# Patient Record
Sex: Female | Born: 1945
Health system: Southern US, Community
[De-identification: ages and names within clinical notes are randomized; demographics above are authoritative.]

## PROBLEM LIST (undated history)

## (undated) DIAGNOSIS — I10 Essential (primary) hypertension: Secondary | ICD-10-CM

## (undated) DIAGNOSIS — H35039 Hypertensive retinopathy, unspecified eye: Secondary | ICD-10-CM

## (undated) DIAGNOSIS — E119 Type 2 diabetes mellitus without complications: Secondary | ICD-10-CM

## (undated) DIAGNOSIS — E11319 Type 2 diabetes mellitus with unspecified diabetic retinopathy without macular edema: Secondary | ICD-10-CM

## (undated) DIAGNOSIS — I639 Cerebral infarction, unspecified: Secondary | ICD-10-CM

## (undated) HISTORY — DX: Essential (primary) hypertension: I10

## (undated) HISTORY — PX: GALLBLADDER SURGERY: SHX652

## (undated) HISTORY — DX: Cerebral infarction, unspecified: I63.9

## (undated) HISTORY — DX: Hypertensive retinopathy, unspecified eye: H35.039

## (undated) HISTORY — DX: Type 2 diabetes mellitus with unspecified diabetic retinopathy without macular edema: E11.319

## (undated) HISTORY — PX: COLONOSCOPY: SHX174

## (undated) HISTORY — DX: Type 2 diabetes mellitus without complications: E11.9

## (undated) HISTORY — PX: CATARACT EXTRACTION: SUR2

## (undated) HISTORY — PX: ESOPHAGOGASTRODUODENOSCOPY: SHX1529

## (undated) HISTORY — PX: EYE SURGERY: SHX253

---
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
## 2017-05-30 DIAGNOSIS — I639 Cerebral infarction, unspecified: Secondary | ICD-10-CM

## 2017-05-30 HISTORY — DX: Cerebral infarction, unspecified: I63.9

## 2018-01-30 ENCOUNTER — Ambulatory Visit (INDEPENDENT_AMBULATORY_CARE_PROVIDER_SITE_OTHER): Payer: Medicare HMO | Admitting: Family Medicine

## 2018-01-30 ENCOUNTER — Encounter: Payer: Self-pay | Admitting: Family Medicine

## 2018-01-30 ENCOUNTER — Ambulatory Visit: Payer: Self-pay | Admitting: Family Medicine

## 2018-01-30 VITALS — BP 128/78 | HR 91 | Ht 66.5 in | Wt 199.1 lb

## 2018-01-30 DIAGNOSIS — Z1211 Encounter for screening for malignant neoplasm of colon: Secondary | ICD-10-CM

## 2018-01-30 DIAGNOSIS — I10 Essential (primary) hypertension: Secondary | ICD-10-CM | POA: Insufficient documentation

## 2018-01-30 DIAGNOSIS — E7800 Pure hypercholesterolemia, unspecified: Secondary | ICD-10-CM

## 2018-01-30 DIAGNOSIS — Z794 Long term (current) use of insulin: Secondary | ICD-10-CM

## 2018-01-30 DIAGNOSIS — E1140 Type 2 diabetes mellitus with diabetic neuropathy, unspecified: Secondary | ICD-10-CM | POA: Diagnosis not present

## 2018-01-30 LAB — URINALYSIS, ROUTINE W REFLEX MICROSCOPIC
Bilirubin Urine: NEGATIVE
Ketones, ur: NEGATIVE
Nitrite: NEGATIVE
Specific Gravity, Urine: 1.02 (ref 1.000–1.030)
Total Protein, Urine: NEGATIVE
Urine Glucose: NEGATIVE
Urobilinogen, UA: 0.2 (ref 0.0–1.0)
pH: 5.5 (ref 5.0–8.0)

## 2018-01-30 LAB — LIPID PANEL
Cholesterol: 142 mg/dL (ref 0–200)
HDL: 31.3 mg/dL — ABNORMAL LOW (ref >39.00)
NonHDL: 110.59
Total CHOL/HDL Ratio: 5
Triglycerides: 208 mg/dL — ABNORMAL HIGH (ref 0.0–149.0)
VLDL: 41.6 mg/dL — ABNORMAL HIGH (ref 0.0–40.0)

## 2018-01-30 LAB — CBC
HCT: 31.1 % — ABNORMAL LOW (ref 36.0–46.0)
Hemoglobin: 10.7 g/dL — ABNORMAL LOW (ref 12.0–15.0)
MCHC: 34.4 g/dL (ref 30.0–36.0)
MCV: 83 fl (ref 78.0–100.0)
Platelets: 442 10*3/uL — ABNORMAL HIGH (ref 150.0–400.0)
RBC: 3.75 Mil/uL — ABNORMAL LOW (ref 3.87–5.11)
RDW: 12.2 % (ref 11.5–15.5)
WBC: 6.9 10*3/uL (ref 4.0–10.5)

## 2018-01-30 LAB — COMPREHENSIVE METABOLIC PANEL
ALT: 9 U/L (ref 0–35)
AST: 12 U/L (ref 0–37)
Albumin: 3.7 g/dL (ref 3.5–5.2)
Alkaline Phosphatase: 74 U/L (ref 39–117)
BUN: 27 mg/dL — ABNORMAL HIGH (ref 6–23)
CO2: 25 mEq/L (ref 19–32)
Calcium: 9.6 mg/dL (ref 8.4–10.5)
Chloride: 102 mEq/L (ref 96–112)
Creatinine, Ser: 1.12 mg/dL (ref 0.40–1.20)
GFR: 61.67 mL/min (ref >60.00)
Glucose, Bld: 218 mg/dL — ABNORMAL HIGH (ref 70–99)
Potassium: 4 mEq/L (ref 3.5–5.1)
Sodium: 136 mEq/L (ref 135–145)
Total Bilirubin: 0.3 mg/dL (ref 0.2–1.2)
Total Protein: 7.5 g/dL (ref 6.0–8.3)

## 2018-01-30 LAB — MICROALBUMIN / CREATININE URINE RATIO
Creatinine,U: 131.4 mg/dL
Microalb Creat Ratio: 3.1 mg/g (ref 0.0–30.0)
Microalb, Ur: 4 mg/dL — ABNORMAL HIGH (ref 0.0–1.9)

## 2018-01-30 LAB — HEMOGLOBIN A1C: Hgb A1c MFr Bld: 10.1 % — ABNORMAL HIGH (ref 4.6–6.5)

## 2018-01-30 LAB — LDL CHOLESTEROL, DIRECT: Direct LDL: 80 mg/dL

## 2018-01-30 MED ORDER — INSULIN GLARGINE 100 UNIT/ML ~~LOC~~ SOLN: 20.0000 [IU] | Freq: Two times a day (BID) | SUBCUTANEOUS | 3 refills | Status: DC

## 2018-01-30 MED ORDER — LISINOPRIL 20 MG PO TABS: 20.0000 mg | ORAL_TABLET | Freq: Every day | ORAL | 2 refills | Status: DC

## 2018-01-30 MED ORDER — GABAPENTIN 300 MG PO CAPS: ORAL_CAPSULE | ORAL | 3 refills | Status: DC

## 2018-01-30 MED ORDER — ATORVASTATIN CALCIUM 40 MG PO TABS: 40.0000 mg | ORAL_TABLET | Freq: Every day | ORAL | 2 refills | Status: DC

## 2018-01-30 NOTE — Patient Instructions (Signed)
DASH Eating Plan DASH stands for "Dietary Approaches to Stop Hypertension." The DASH eating plan is a healthy eating plan that has been shown to reduce high blood pressure (hypertension). It may also reduce your risk for type 2 diabetes, heart disease, and stroke. The DASH eating plan may also help with weight loss. What are tips for following this plan? General guidelines  Avoid eating more than 2,300 mg (milligrams) of salt (sodium) a day. If you have hypertension, you may need to reduce your sodium intake to 1,500 mg a day.  Limit alcohol intake to no more than 1 drink a day for nonpregnant women and 2 drinks a day for men. One drink equals 12 oz of beer, 5 oz of wine, or 1 oz of hard liquor.  Work with your health care provider to maintain a healthy body weight or to lose weight. Ask what an ideal weight is for you.  Get at least 30 minutes of exercise that causes your heart to beat faster (aerobic exercise) most days of the week. Activities may include walking, swimming, or biking.  Work with your health care provider or diet and nutrition specialist (dietitian) to adjust your eating plan to your individual calorie needs. Reading food labels  Check food labels for the amount of sodium per serving. Choose foods with less than 5 percent of the Daily Value of sodium. Generally, foods with less than 300 mg of sodium per serving fit into this eating plan.  To find whole grains, look for the word "whole" as the first word in the ingredient list. Shopping  Buy products labeled as "low-sodium" or "no salt added."  Buy fresh foods. Avoid canned foods and premade or frozen meals. Cooking  Avoid adding salt when cooking. Use salt-free seasonings or herbs instead of table salt or sea salt. Check with your health care provider or pharmacist before using salt substitutes.  Do not fry foods. Cook foods using healthy methods such as baking, boiling, grilling, and broiling instead.  Cook with  heart-healthy oils, such as olive, canola, soybean, or sunflower oil. Meal planning   Eat a balanced diet that includes: ? 5 or more servings of fruits and vegetables each day. At each meal, try to fill half of your plate with fruits and vegetables. ? Up to 6-8 servings of whole grains each day. ? Less than 6 oz of lean meat, poultry, or fish each day. A 3-oz serving of meat is about the same size as a deck of cards. One egg equals 1 oz. ? 2 servings of low-fat dairy each day. ? A serving of nuts, seeds, or beans 5 times each week. ? Heart-healthy fats. Healthy fats called Omega-3 fatty acids are found in foods such as flaxseeds and coldwater fish, like sardines, salmon, and mackerel.  Limit how much you eat of the following: ? Canned or prepackaged foods. ? Food that is high in trans fat, such as fried foods. ? Food that is high in saturated fat, such as fatty meat. ? Sweets, desserts, sugary drinks, and other foods with added sugar. ? Full-fat dairy products.  Do not salt foods before eating.  Try to eat at least 2 vegetarian meals each week.  Eat more home-cooked food and less restaurant, buffet, and fast food.  When eating at a restaurant, ask that your food be prepared with less salt or no salt, if possible. What foods are recommended? The items listed may not be a complete list. Talk with your dietitian about what   dietary choices are best for you. Grains Whole-grain or whole-wheat bread. Whole-grain or whole-wheat pasta. Brown rice. Oatmeal. Quinoa. Bulgur. Whole-grain and low-sodium cereals. Pita bread. Low-fat, low-sodium crackers. Whole-wheat flour tortillas. Vegetables Fresh or frozen vegetables (raw, steamed, roasted, or grilled). Low-sodium or reduced-sodium tomato and vegetable juice. Low-sodium or reduced-sodium tomato sauce and tomato paste. Low-sodium or reduced-sodium canned vegetables. Fruits All fresh, dried, or frozen fruit. Canned fruit in natural juice (without  added sugar). Meat and other protein foods Skinless chicken or turkey. Ground chicken or turkey. Pork with fat trimmed off. Fish and seafood. Egg whites. Dried beans, peas, or lentils. Unsalted nuts, nut butters, and seeds. Unsalted canned beans. Lean cuts of beef with fat trimmed off. Low-sodium, lean deli meat. Dairy Low-fat (1%) or fat-free (skim) milk. Fat-free, low-fat, or reduced-fat cheeses. Nonfat, low-sodium ricotta or cottage cheese. Low-fat or nonfat yogurt. Low-fat, low-sodium cheese. Fats and oils Soft margarine without trans fats. Vegetable oil. Low-fat, reduced-fat, or light mayonnaise and salad dressings (reduced-sodium). Canola, safflower, olive, soybean, and sunflower oils. Avocado. Seasoning and other foods Herbs. Spices. Seasoning mixes without salt. Unsalted popcorn and pretzels. Fat-free sweets. What foods are not recommended? The items listed may not be a complete list. Talk with your dietitian about what dietary choices are best for you. Grains Baked goods made with fat, such as croissants, muffins, or some breads. Dry pasta or rice meal packs. Vegetables Creamed or fried vegetables. Vegetables in a cheese sauce. Regular canned vegetables (not low-sodium or reduced-sodium). Regular canned tomato sauce and paste (not low-sodium or reduced-sodium). Regular tomato and vegetable juice (not low-sodium or reduced-sodium). Pickles. Olives. Fruits Canned fruit in a light or heavy syrup. Fried fruit. Fruit in cream or butter sauce. Meat and other protein foods Fatty cuts of meat. Ribs. Fried meat. Bacon. Sausage. Bologna and other processed lunch meats. Salami. Fatback. Hotdogs. Bratwurst. Salted nuts and seeds. Canned beans with added salt. Canned or smoked fish. Whole eggs or egg yolks. Chicken or turkey with skin. Dairy Whole or 2% milk, cream, and half-and-half. Whole or full-fat cream cheese. Whole-fat or sweetened yogurt. Full-fat cheese. Nondairy creamers. Whipped toppings.  Processed cheese and cheese spreads. Fats and oils Butter. Stick margarine. Lard. Shortening. Ghee. Bacon fat. Tropical oils, such as coconut, palm kernel, or palm oil. Seasoning and other foods Salted popcorn and pretzels. Onion salt, garlic salt, seasoned salt, table salt, and sea salt. Worcestershire sauce. Tartar sauce. Barbecue sauce. Teriyaki sauce. Soy sauce, including reduced-sodium. Steak sauce. Canned and packaged gravies. Fish sauce. Oyster sauce. Cocktail sauce. Horseradish that you find on the shelf. Ketchup. Mustard. Meat flavorings and tenderizers. Bouillon cubes. Hot sauce and Tabasco sauce. Premade or packaged marinades. Premade or packaged taco seasonings. Relishes. Regular salad dressings. Where to find more information:  National Heart, Lung, and Blood Institute: www.nhlbi.nih.gov  American Heart Association: www.heart.org Summary  The DASH eating plan is a healthy eating plan that has been shown to reduce high blood pressure (hypertension). It may also reduce your risk for type 2 diabetes, heart disease, and stroke.  With the DASH eating plan, you should limit salt (sodium) intake to 2,300 mg a day. If you have hypertension, you may need to reduce your sodium intake to 1,500 mg a day.  When on the DASH eating plan, aim to eat more fresh fruits and vegetables, whole grains, lean proteins, low-fat dairy, and heart-healthy fats.  Work with your health care provider or diet and nutrition specialist (dietitian) to adjust your eating plan to your individual   calorie needs. This information is not intended to replace advice given to you by your health care provider. Make sure you discuss any questions you have with your health care provider. Document Released: 05/05/2011 Document Revised: 05/09/2016 Document Reviewed: 05/09/2016 Elsevier Interactive Patient Education  2018 [employer].  Diabetes Mellitus and Exercise Exercising regularly is important for your overall health,  especially when you have diabetes (diabetes mellitus). Exercising is not only about losing weight. It has many health benefits, such as increasing muscle strength and bone density and reducing body fat and stress. This leads to improved fitness, flexibility, and endurance, all of which result in better overall health. Exercise has additional benefits for people with diabetes, including:  Reducing appetite.  Helping to lower and control blood glucose.  Lowering blood pressure.  Helping to control amounts of fatty substances (lipids) in the blood, such as cholesterol and triglycerides.  Helping the body to respond better to insulin (improving insulin sensitivity).  Reducing how much insulin the body needs.  Decreasing the risk for heart disease by: ? Lowering cholesterol and triglyceride levels. ? Increasing the levels of good cholesterol. ? Lowering blood glucose levels.  What is my activity plan? Your health care provider or certified diabetes educator can help you make a plan for the type and frequency of exercise (activity plan) that works for you. Make sure that you:  Do at least 150 minutes of moderate-intensity or vigorous-intensity exercise each week. This could be brisk walking, biking, or water aerobics. ? Do stretching and strength exercises, such as yoga or weightlifting, at least 2 times a week. ? Spread out your activity over at least 3 days of the week.  Get some form of physical activity every day. ? Do not go more than 2 days in a row without some kind of physical activity. ? Avoid being inactive for more than 90 minutes at a time. Take frequent breaks to walk or stretch.  Choose a type of exercise or activity that you enjoy, and set realistic goals.  Start slowly, and gradually increase the intensity of your exercise over time.  What do I need to know about managing my diabetes?  Check your blood glucose before and after exercising. ? If your blood glucose is  higher than 240 mg/dL (13.3 mmol/L) before you exercise, check your urine for ketones. If you have ketones in your urine, do not exercise until your blood glucose returns to normal.  Know the symptoms of low blood glucose (hypoglycemia) and how to treat it. Your risk for hypoglycemia increases during and after exercise. Common symptoms of hypoglycemia can include: ? Hunger. ? Anxiety. ? Sweating and feeling clammy. ? Confusion. ? Dizziness or feeling light-headed. ? Increased heart rate or palpitations. ? Blurry vision. ? Tingling or numbness around the mouth, lips, or tongue. ? Tremors or shakes. ? Irritability.  Keep a rapid-acting carbohydrate snack available before, during, and after exercise to help prevent or treat hypoglycemia.  Avoid injecting insulin into areas of the body that are going to be exercised. For example, avoid injecting insulin into: ? The arms, when playing tennis. ? The legs, when jogging.  Keep records of your exercise habits. Doing this can help you and your health care provider adjust your diabetes management plan as needed. Write down: ? Food that you eat before and after you exercise. ? Blood glucose levels before and after you exercise. ? The type and amount of exercise you have done. ? When your insulin is expected to  peak, if you use insulin. Avoid exercising at times when your insulin is peaking.  When you start a new exercise or activity, work with your health care provider to make sure the activity is safe for you, and to adjust your insulin, medicines, or food intake as needed.  Drink plenty of water while you exercise to prevent dehydration or heat stroke. Drink enough fluid to keep your urine clear or pale yellow. This information is not intended to replace advice given to you by your health care provider. Make sure you discuss any questions you have with your health care provider. Document Released: 08/06/2003 Document Revised: 12/04/2015 Document  Reviewed: 10/26/2015 Elsevier Interactive Patient Education  2018 [employer] Inc.  Heart Disease Prevention Heart disease is a leading cause of death. There are many things you can do to help prevent heart disease. Be physically active Physical activity is good for your heart. It helps control your blood pressure, cholesterol levels, and weight. Try to be physically active every day. Ask your health care provider what activities are best for you. Be a healthy weight Extra weight can strain your heart and affect your blood pressure and cholesterol levels. Lose weight with diet and exercise if recommended by your health care provider. Eat heart-healthy foods Follow a healthy eating plan as recommended by your health care provider or dietitian. Heart-healthy foods include:  High-fiber foods. These include oat bran, oatmeal, and whole-grain breads and cereals.  Fruits and vegetables.  Avoid:  Alcohol.  Fried foods.  Foods high in saturated fat. These include meats, butter, whole dairy products, shortening, and coconut or palm oil.  Salty foods. These include canned food, luncheon meat, salty snacks, and fast food.  Keep your cholesterol levels under control Cholesterol is a substance that is used for many important functions. When your cholesterol levels are high, cholesterol can stick to the insides of your blood vessels, making them narrow or clog. This can lead to chest pain (angina) and a heart attack. Keep your cholesterol levels under control as recommended by your health care provider. Have your cholesterol checked at least once a year. Target cholesterol levels (in mg/dL) for most people are:  Total cholesterol below 200.  LDL cholesterol below 100.  HDL cholesterol above 40 in men and above 50 in women.  Triglycerides below 150.  Keep your blood pressure under control Having high blood pressure (hypertension) puts you at risk for stroke and other forms of heart disease.  Keep your blood pressure under control as recommended by your health care provider. Ask your health care provider if you need treatment to lower your blood pressure. If you are 10-55 years of age, have your blood pressure checked every 3-5 years. If you are 6 years of age or older, have your blood pressure checked every year. Do not use tobacco products Tobacco smoke can damage your heart and blood vessels. Do not use any tobacco products including cigarettes, chewing tobacco, or electronic cigarettes. If you need help quitting, ask your health care provider. Take medicines as directed Take medicines only as directed by your health care provider. Ask your health care provider whether you should take an aspirin every day. Taking aspirin can help reduce your risk of heart disease and stroke. Where to find more information: To find out more about heart disease, visit the American Heart Association's website at www.americanheart.org This information is not intended to replace advice given to you by your health care provider. Make sure you discuss any questions you  have with your health care provider. Document Released: 12/29/2003 Document Revised: 10/14/2015 Document Reviewed: 07/10/2013 Elsevier Interactive Patient Education  [employer].

## 2018-01-30 NOTE — Progress Notes (Signed)
Subjective:  Patient ID: Tara Conner, female    DOB: [birth year]  Age: 72 y.o. MRN: [id number]  CC: Establish Care   HPI Adaisha Campise presents for patient presents for follow-up of her hypertension, diabetes and elevated cholesterol.  She tells me that her diabetes is been controlled with Lantus 20 units twice daily.  She has been unable to tolerate Glucophage secondary to GI side effects.  Recent dilated eye exam this year.  She is tolerating the lisinopril for her blood pressure and it is been well controlled.  Cholesterol has been controlled with atorvastatin.  She has had diabetes over the last 30 years ago.  Recently moved to this area from [place] to be closer to her children.  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  She does report burning and tingling in her feet.  She does admit to dietary indiscretion with regards to sweets.  She is 3 hours fasting today.   History Addalynne has no past medical history on file.   She has no past surgical history on file.   Her family history is not on file.She reports that she has never smoked. She has never used smokeless tobacco. Her alcohol and drug histories are not on file.  Outpatient Medications Prior to Visit  Medication Sig Dispense Refill  . glucose blood test strip Use to test blood sugars twice daily. Onetouch Ultra    . ranitidine (ZANTAC) 150 MG capsule Take 150 mg by mouth 2 (two) times daily.    Marland Kitchen atorvastatin (LIPITOR) 40 MG tablet Take 40 mg by mouth daily.    . insulin glargine (LANTUS) 100 UNIT/ML injection Take 20 units in the morning and 20 units at night.    Marland Kitchen lisinopril (PRINIVIL,ZESTRIL) 20 MG tablet Take 20 mg by mouth daily.     No facility-administered medications prior to visit.     ROS Review of Systems  Constitutional: Negative for chills, fatigue, fever and unexpected weight change.  HENT: Negative.   Eyes: Negative.  Negative for photophobia and visual disturbance.  Respiratory:  Negative.   Cardiovascular: Negative.   Endocrine: Negative for polyphagia and polyuria.  Genitourinary: Negative for difficulty urinating and frequency.  Musculoskeletal: Negative for gait problem and joint swelling.  Skin: Negative for pallor.  Allergic/Immunologic: Negative for immunocompromised state.  Neurological: Positive for numbness. Negative for weakness.  Hematological: Does not bruise/bleed easily.  Psychiatric/Behavioral: Negative.     Objective:  BP 128/78   Pulse 91   Ht 5' 6.5" (1.689 m)   Wt 199 lb 2 oz (90.3 kg)   LMP  (LMP Unknown)   SpO2 99%   BMI 31.66 kg/m   Physical Exam  Constitutional: She is oriented to person, place, and time. She appears well-developed and well-nourished. No distress.  HENT:  Head: Normocephalic and atraumatic.  Right Ear: External ear normal.  Left Ear: External ear normal.  Mouth/Throat: Oropharynx is clear and moist. No oropharyngeal exudate.  Eyes: Pupils are equal, round, and reactive to light. Conjunctivae and EOM are normal. Right eye exhibits no discharge. Left eye exhibits no discharge. No scleral icterus.  Neck: Neck supple. No JVD present. No tracheal deviation present. No thyromegaly present.  Cardiovascular: Normal rate, regular rhythm and normal heart sounds.  Pulses:      Dorsalis pedis pulses are 1+ on the right side, and 1+ on the left side.       Posterior tibial pulses are 1+ on the right side, and 1+ on the  left side.  Pulmonary/Chest: Effort normal and breath sounds normal.  Abdominal: Bowel sounds are normal.  Lymphadenopathy:    She has no cervical adenopathy.  Neurological: She is alert and oriented to person, place, and time.  Skin: Skin is warm and dry. No rash noted. She is not diaphoretic. No erythema. No pallor.  Psychiatric: She has a normal mood and affect. Her behavior is normal.    Diabetic Foot Exam   Diabetic Foot Exam - Simple   Simple Foot Form  Visual Inspection  See comments: Yes    Sensation Testing  See comments: Yes  Pulse Check  See comments: Yes  Comments  Feet are pes planus without lesions, scars or ulcers   Multiple foci of decreased sensation to light touch.   Pulses are diminished.    Assessment & Plan:   Shyah was seen today for establish care.  Diagnoses and all orders for this visit:  Essential hypertension -     lisinopril (PRINIVIL,ZESTRIL) 20 MG tablet; Take 1 tablet (20 mg total) by mouth daily. -     Cancel: Microalbumin / creatinine urine ratio -     CBC -     Comprehensive metabolic panel -     Urinalysis, Routine w reflex microscopic -     Microalbumin / creatinine urine ratio  Type 2 diabetes mellitus with diabetic neuropathy, with long-term current use of insulin (HCC) -     insulin glargine (LANTUS) 100 UNIT/ML injection; Inject 0.2 mLs (20 Units total) into the skin 2 (two) times daily. Take 20 units in the morning and 20 units at night. -     lisinopril (PRINIVIL,ZESTRIL) 20 MG tablet; Take 1 tablet (20 mg total) by mouth daily. -     Cancel: Microalbumin / creatinine urine ratio -     Ambulatory referral to Podiatry -     CBC -     Comprehensive metabolic panel -     Hemoglobin A1c -     Urinalysis, Routine w reflex microscopic -     Microalbumin / creatinine urine ratio -     gabapentin (NEURONTIN) 300 MG capsule; Take one at night for one week and then increase to one twice daily as tolerated.  Elevated LDL cholesterol level -     atorvastatin (LIPITOR) 40 MG tablet; Take 1 tablet (40 mg total) by mouth daily. -     CBC -     Comprehensive metabolic panel -     Lipid panel -     LDL cholesterol, direct  Screen for colon cancer -     Ambulatory referral to Gastroenterology   I have changed Gardiner Ramus Corti's insulin glargine, atorvastatin, and lisinopril. I am also having her start on gabapentin. Additionally, I am having her maintain her ranitidine and glucose blood.  Meds ordered this encounter  Medications   . insulin glargine (LANTUS) 100 UNIT/ML injection    Sig: Inject 0.2 mLs (20 Units total) into the skin 2 (two) times daily. Take 20 units in the morning and 20 units at night.    Dispense:  10 mL    Refill:  3  . atorvastatin (LIPITOR) 40 MG tablet    Sig: Take 1 tablet (40 mg total) by mouth daily.    Dispense:  90 tablet    Refill:  2  . lisinopril (PRINIVIL,ZESTRIL) 20 MG tablet    Sig: Take 1 tablet (20 mg total) by mouth daily.    Dispense:  30 tablet  Refill:  2  . gabapentin (NEURONTIN) 300 MG capsule    Sig: Take one at night for one week and then increase to one twice daily as tolerated.    Dispense:  60 capsule    Refill:  3   Patient is 3 hours fasting today.  Diabetic neuropathy in her feet.  Will begin Neurontin and refer to podiatry.  Anticipatory guidance was given with regards to diabetes management and exercising.  Risks of heart disease and diabetes.  She will reestablish with her GYN and provider or her Pap and pelvic exams.  Follow-up for now is 3 months may be sooner pending results of her today's blood work.  Follow-up: Return in about 3 months (around 05/01/2018).  Mliss Sax, MD

## 2018-01-31 ENCOUNTER — Telehealth: Payer: Self-pay | Admitting: Family Medicine

## 2018-01-31 MED ORDER — RANITIDINE HCL 150 MG PO CAPS: 150.0000 mg | ORAL_CAPSULE | Freq: Two times a day (BID) | ORAL | 1 refills | Status: DC

## 2018-01-31 MED ORDER — GLUCOSE BLOOD VI STRP: ORAL_STRIP | 5 refills | Status: DC

## 2018-01-31 NOTE — Telephone Encounter (Signed)
Glucose blood test strip and Ranitidine (Zantac) 150mg  cap refill. Previously filled by historical provider.   Last OV: 01/30/18 PCP: Dr. Doreene Burke Pharmacy:[facility] on [address]

## 2018-01-31 NOTE — Telephone Encounter (Signed)
Patient is aware 

## 2018-01-31 NOTE — Telephone Encounter (Signed)
Rx's sent into pharmacy.  

## 2018-01-31 NOTE — Telephone Encounter (Signed)
Copied from CRM [id number]. Topic: Quick Communication - Rx Refill/Question >> Jan 31, 2018 11:18 AM Arlyss Gandy, NT wrote: Medication: glucose blood test strip and ranitidine (ZANTAC) 150 MG capsule   Has the patient contacted their pharmacy? Yes.   (Agent: If no, request that the patient contact the pharmacy for the refill.) (Agent: If yes, when and what did the pharmacy advise?)  Preferred Pharmacy (with phone number or street name): [id number] - [place], [place] - [address] AT [address] [phone] (Phone) [phone] (Fax)    Agent: Please be advised that RX refills may take up to 3 business days. We ask that you follow-up with your pharmacy.

## 2018-02-02 ENCOUNTER — Other Ambulatory Visit: Payer: Self-pay

## 2018-02-02 MED ORDER — ACCU-CHEK AVIVA PLUS W/DEVICE KIT: PACK | 0 refills | Status: DC

## 2018-02-02 MED ORDER — ACCU-CHEK SOFT TOUCH LANCETS MISC: 5 refills | Status: DC

## 2018-02-02 MED ORDER — GLUCOSE BLOOD VI STRP: ORAL_STRIP | 5 refills | Status: DC

## 2018-02-05 ENCOUNTER — Telehealth: Payer: Self-pay | Admitting: Family Medicine

## 2018-02-05 NOTE — Telephone Encounter (Signed)
Copied from CRM [id number]. Topic: Quick Communication - Lab Results >> Feb 01, 2018  2:29 PM Self, Dayton Scrape, CMA wrote: Called patient to inform them of her lab results. When patient returns call, triage nurse may disclose results. Patient needs 30 minute OV to go over results with provider, okay to schedule the week of 9/9. >> Feb 05, 2018  4:53 PM Laural Benes, [place] C wrote: Pt is returning call for results.

## 2018-02-06 NOTE — Telephone Encounter (Signed)
Spoke to patient- she is going to call her son/daughter and arrange transportation- she will call back to schedule appointment- she is aware she will need 30 minutes.

## 2018-02-09 ENCOUNTER — Encounter: Payer: Self-pay | Admitting: Family Medicine

## 2018-02-09 ENCOUNTER — Ambulatory Visit (INDEPENDENT_AMBULATORY_CARE_PROVIDER_SITE_OTHER): Payer: Medicare HMO | Admitting: Family Medicine

## 2018-02-09 VITALS — BP 150/78 | HR 98 | Temp 98.4°F | Ht 66.5 in | Wt 199.0 lb

## 2018-02-09 DIAGNOSIS — R319 Hematuria, unspecified: Secondary | ICD-10-CM | POA: Diagnosis not present

## 2018-02-09 DIAGNOSIS — E1140 Type 2 diabetes mellitus with diabetic neuropathy, unspecified: Secondary | ICD-10-CM | POA: Diagnosis not present

## 2018-02-09 DIAGNOSIS — D509 Iron deficiency anemia, unspecified: Secondary | ICD-10-CM

## 2018-02-09 DIAGNOSIS — N390 Urinary tract infection, site not specified: Secondary | ICD-10-CM | POA: Diagnosis not present

## 2018-02-09 DIAGNOSIS — R8290 Unspecified abnormal findings in urine: Secondary | ICD-10-CM | POA: Diagnosis not present

## 2018-02-09 DIAGNOSIS — Z794 Long term (current) use of insulin: Secondary | ICD-10-CM

## 2018-02-09 DIAGNOSIS — D649 Anemia, unspecified: Secondary | ICD-10-CM | POA: Diagnosis not present

## 2018-02-09 MED ORDER — DULAGLUTIDE 0.75 MG/0.5ML ~~LOC~~ SOAJ: SUBCUTANEOUS | 5 refills | Status: DC

## 2018-02-09 NOTE — Progress Notes (Addendum)
Subjective:  Patient ID: Tara Conner, female    DOB: November 17, 1945  Age: 72 y.o. MRN: [id number]  CC: Follow-up   HPI Tara Conner presents for follow-up of her abnormal laboratory results.  Hemoglobin A1c was 10.1.  She has been taking Lantus 20 units twice daily.  Blood sugars have been elevated.  She is truly unable to tolerate metformin secondary to severe GI side effects.  Hemoglobin was 10.  She does admit to history of poor dietary choices.  She does have a history of iron deficiency anemia.  No recent colonoscopy.  She has been referred for colonoscopy but is unable to afford the copayment at this time.  Medicaid is pending.  She sees no blood or melena or stool.  There is no hematuria.  She has never smoked any extent.  She is having no dysuria frequency or urgency.  She was diagnosed with a UTI at urgent care 2 months ago.  Outpatient Medications Prior to Visit  Medication Sig Dispense Refill  . atorvastatin (LIPITOR) 40 MG tablet Take 1 tablet (40 mg total) by mouth daily. 90 tablet 2  . Blood Glucose Monitoring Suppl (ACCU-CHEK AVIVA PLUS) w/Device KIT Use to test blood sugars 2 times daily, insurance does not cover one touch anymore. 1 kit 0  . gabapentin (NEURONTIN) 300 MG capsule Take one at night for one week and then increase to one twice daily as tolerated. 60 capsule 3  . glucose blood (ACCU-CHEK AVIVA PLUS) test strip Use to test blood sugars two times daily. 100 each 5  . insulin glargine (LANTUS) 100 UNIT/ML injection Inject 0.2 mLs (20 Units total) into the skin 2 (two) times daily. Take 20 units in the morning and 20 units at night. 10 mL 3  . Lancets (ACCU-CHEK SOFT TOUCH) lancets Use to check blood sugars two times daily. 100 each 5  . lisinopril (PRINIVIL,ZESTRIL) 20 MG tablet Take 1 tablet (20 mg total) by mouth daily. 30 tablet 2  . ranitidine (ZANTAC) 150 MG capsule Take 1 capsule (150 mg total) by mouth 2 (two) times daily. 180 capsule 1   No  facility-administered medications prior to visit.     ROS Review of Systems  Constitutional: Negative for chills, fatigue, fever and unexpected weight change.  HENT: Negative.   Eyes: Negative.   Respiratory: Negative.   Cardiovascular: Negative.   Gastrointestinal: Negative.   Endocrine: Negative for polyphagia and polyuria.  Genitourinary: Negative for dysuria, frequency and urgency.  Skin: Negative for pallor and rash.  Allergic/Immunologic: Negative for immunocompromised state.  Neurological: Negative for headaches.  Hematological: Does not bruise/bleed easily.  Psychiatric/Behavioral: Negative.     Objective:  BP (!) 150/78 (BP Location: Left Arm, Patient Position: Sitting, Cuff Size: Normal)   Pulse 98   Temp 98.4 F (36.9 C) (Oral)   Ht 5' 6.5" (1.689 m)   Wt 199 lb (90.3 kg)   LMP  (LMP Unknown)   SpO2 96%   BMI 31.64 kg/m   BP Readings from Last 3 Encounters:  02/09/18 (!) 150/78  01/30/18 128/78    Wt Readings from Last 3 Encounters:  02/09/18 199 lb (90.3 kg)  01/30/18 199 lb 2 oz (90.3 kg)    Physical Exam  Constitutional: She is oriented to person, place, and time. She appears well-developed and well-nourished. No distress.  HENT:  Head: Normocephalic and atraumatic.  Right Ear: External ear normal.  Left Ear: External ear normal.  Eyes: Right eye exhibits no discharge. Left eye exhibits  no discharge. No scleral icterus.  Pulmonary/Chest: Effort normal.  Neurological: She is alert and oriented to person, place, and time.  Skin: Skin is warm and dry. She is not diaphoretic.  Psychiatric: She has a normal mood and affect. Her behavior is normal.    Lab Results  Component Value Date   WBC 6.9 01/30/2018   HGB 10.7 (L) 01/30/2018   HCT 31.1 (L) 01/30/2018   PLT 442.0 (H) 01/30/2018   GLUCOSE 218 (H) 01/30/2018   CHOL 142 01/30/2018   TRIG 208.0 (H) 01/30/2018   HDL 31.30 (L) 01/30/2018   LDLDIRECT 80.0 01/30/2018   ALT 9 01/30/2018   AST 12  01/30/2018   NA 136 01/30/2018   K 4.0 01/30/2018   CL 102 01/30/2018   CREATININE 1.12 01/30/2018   BUN 27 (H) 01/30/2018   CO2 25 01/30/2018   HGBA1C 10.1 (H) 01/30/2018   MICROALBUR 4.0 (H) 01/30/2018    Patient was never admitted.  Assessment & Plan:   Tara Conner was seen today for follow-up.  Diagnoses and all orders for this visit:  Type 2 diabetes mellitus with diabetic neuropathy, with long-term current use of insulin (HCC) -     Dulaglutide (TRULICITY) 4.09 WJ/1.9JY SOPN; Inject .7m one weekly sub cutaneously.  Abnormal finding in urine -     Urinalysis, Routine w reflex microscopic -     Urine Culture  Anemia, unspecified type -     Iron, TIBC and Ferritin Panel -     B12 and Folate Panel  Iron deficiency anemia, unspecified iron deficiency anemia type -     ferrous sulfate 325 (65 Tara) MG tablet; Take 1 tablet (325 mg total) by mouth daily with breakfast.  Urinary tract infection with hematuria, site unspecified -     sulfamethoxazole-trimethoprim (BACTRIM DS,SEPTRA DS) 800-160 MG tablet; Take 1 tablet by mouth 2 (two) times daily for 7 days.  Other orders -     MICROSCOPIC MESSAGE   I am having Tara Shortsstart on Dulaglutide, sulfamethoxazole-trimethoprim, and ferrous sulfate. I am also having her maintain her insulin glargine, atorvastatin, lisinopril, gabapentin, ranitidine, ACCU-CHEK AVIVA PLUS, glucose blood, and accu-chek soft touch.  Meds ordered this encounter  Medications  . Dulaglutide (TRULICITY) 07.82MNF/6.2ZHSOPN    Sig: Inject .735mone weekly sub cutaneously.    Dispense:  4 pen    Refill:  5  . sulfamethoxazole-trimethoprim (BACTRIM DS,SEPTRA DS) 800-160 MG tablet    Sig: Take 1 tablet by mouth 2 (two) times daily for 7 days.    Dispense:  14 tablet    Refill:  0  . ferrous sulfate 325 (65 Tara) MG tablet    Sig: Take 1 tablet (325 mg total) by mouth daily with breakfast.    Dispense:  90 tablet    Refill:  1   She will switch  to taking her Lantus 40 units nightly.  She will adjust the next evening dose by 4 units until the following morning's fasting blood sugar is less than 130.  Have added 0.0.86g of Trulicity weekly.  She will make better dietary choices.  She is taking her iron pill every other day.  She cannot tolerate daily iron secondary to constipation.  Abnormal UA from last visit is being followed up today.  9/16: Patient's blood work did show iron deficiency anemia.  Have asked her to take an iron pill daily.  She has had problems taking iron in the past and iron transfusions could be a  alternative for her.  Unfortunately she does have trouble affording co-pays recently.  Urine culture was positive for Klebsiella.  Have sent in Septra twice daily for 7 days.  Follow-up: Return in about 3 months (around 05/11/2018), or if symptoms worsen or fail to improve.  Libby Maw, MD

## 2018-02-11 LAB — URINE CULTURE
MICRO NUMBER:: 91100340
SPECIMEN QUALITY:: ADEQUATE

## 2018-02-11 LAB — URINALYSIS, ROUTINE W REFLEX MICROSCOPIC
Bilirubin Urine: NEGATIVE
Glucose, UA: NEGATIVE
Hyaline Cast: NONE SEEN /LPF
Ketones, ur: NEGATIVE
Nitrite: NEGATIVE
Specific Gravity, Urine: 1.021 (ref 1.001–1.03)
WBC, UA: >=60 /HPF — AB (ref 0–5)
pH: <=5 (ref 5.0–8.0)

## 2018-02-11 LAB — B12 AND FOLATE PANEL
Folate: 17.8 ng/mL
Vitamin B-12: 764 pg/mL (ref 200–1100)

## 2018-02-11 LAB — IRON,TIBC AND FERRITIN PANEL
%SAT: 8 % (calc) — ABNORMAL LOW (ref 16–45)
Ferritin: 14 ng/mL — ABNORMAL LOW (ref 16–288)
Iron: 30 ug/dL — ABNORMAL LOW (ref 45–160)
TIBC: 353 mcg/dL (calc) (ref 250–450)

## 2018-02-12 DIAGNOSIS — R319 Hematuria, unspecified: Secondary | ICD-10-CM

## 2018-02-12 DIAGNOSIS — D509 Iron deficiency anemia, unspecified: Secondary | ICD-10-CM | POA: Insufficient documentation

## 2018-02-12 DIAGNOSIS — N390 Urinary tract infection, site not specified: Secondary | ICD-10-CM | POA: Insufficient documentation

## 2018-02-12 MED ORDER — SULFAMETHOXAZOLE-TRIMETHOPRIM 800-160 MG PO TABS: 1.0000 | ORAL_TABLET | Freq: Two times a day (BID) | ORAL | 0 refills | Status: AC

## 2018-02-12 MED ORDER — FERROUS SULFATE 325 (65 FE) MG PO TABS: 325.0000 mg | ORAL_TABLET | Freq: Every day | ORAL | 1 refills | Status: DC

## 2018-02-12 NOTE — Addendum Note (Signed)
Addended by: Andrez GrimeKREMER, WILLIAM A on: 02/12/2018 09:25 AM   Modules accepted: Orders

## 2018-03-07 ENCOUNTER — Telehealth: Payer: Self-pay | Admitting: Family Medicine

## 2018-03-07 NOTE — Telephone Encounter (Signed)
I left patient a voicemail to call the office back to schedule an appointment with Dr. Doreene Burke & he wants her to bring her blood sugar monitor with her as well. Crm created & pec can schedule appointment.

## 2018-03-07 NOTE — Telephone Encounter (Signed)
Patient is coming in on 10/14 at 11:00am.

## 2018-03-07 NOTE — Telephone Encounter (Signed)
I called and spoke with patient. Her blood sugars have been running in the 70's and she feels okay & she'll eat something. Patient said she only takes the insulin once a day. She took her blood sugar this morning at 9am and it was 50. Patient gave herself 45 units of her insulin last night.

## 2018-03-07 NOTE — Telephone Encounter (Signed)
Copied from CRM [id number]. Topic: Quick Communication - Rx Refill/Question >> Mar 07, 2018  9:56 AM Alexander Bergeron B wrote: Medication: insulin glargine (LANTUS) 100 UNIT/ML injection [[id number]]   Pt called b/c she has been seeing her blood sugar has been running low; pt wants to know if she needs to take a different dosage of the medication above; contact pt to advise

## 2018-03-12 ENCOUNTER — Ambulatory Visit: Payer: Medicare HMO | Admitting: Family Medicine

## 2018-03-15 ENCOUNTER — Ambulatory Visit: Payer: Medicare HMO | Admitting: Family Medicine

## 2018-03-20 ENCOUNTER — Encounter: Payer: Self-pay | Admitting: Family Medicine

## 2018-03-20 ENCOUNTER — Ambulatory Visit (INDEPENDENT_AMBULATORY_CARE_PROVIDER_SITE_OTHER): Payer: Medicare HMO | Admitting: Family Medicine

## 2018-03-20 VITALS — BP 120/78 | HR 90 | Temp 98.0°F | Ht 66.5 in | Wt 204.4 lb

## 2018-03-20 DIAGNOSIS — E1140 Type 2 diabetes mellitus with diabetic neuropathy, unspecified: Secondary | ICD-10-CM | POA: Diagnosis not present

## 2018-03-20 DIAGNOSIS — D509 Iron deficiency anemia, unspecified: Secondary | ICD-10-CM | POA: Diagnosis not present

## 2018-03-20 DIAGNOSIS — E7800 Pure hypercholesterolemia, unspecified: Secondary | ICD-10-CM

## 2018-03-20 DIAGNOSIS — I10 Essential (primary) hypertension: Secondary | ICD-10-CM | POA: Diagnosis not present

## 2018-03-20 DIAGNOSIS — Z794 Long term (current) use of insulin: Secondary | ICD-10-CM

## 2018-03-20 MED ORDER — ASPIRIN EC 81 MG PO TBEC: 81.0000 mg | DELAYED_RELEASE_TABLET | Freq: Every day | ORAL | 0 refills | Status: DC

## 2018-03-20 MED ORDER — DULAGLUTIDE 0.75 MG/0.5ML ~~LOC~~ SOAJ: SUBCUTANEOUS | 0 refills | Status: DC

## 2018-03-20 MED ORDER — ACCU-CHEK SOFT TOUCH LANCETS MISC: 5 refills | Status: DC

## 2018-03-20 MED ORDER — LISINOPRIL 20 MG PO TABS: 20.0000 mg | ORAL_TABLET | Freq: Every day | ORAL | 0 refills | Status: DC

## 2018-03-20 MED ORDER — INSULIN GLARGINE 100 UNIT/ML ~~LOC~~ SOLN: 40.0000 [IU] | Freq: Every day | SUBCUTANEOUS | 0 refills | Status: DC

## 2018-03-20 MED ORDER — ATORVASTATIN CALCIUM 40 MG PO TABS: 40.0000 mg | ORAL_TABLET | Freq: Every day | ORAL | 2 refills | Status: DC

## 2018-03-20 NOTE — Progress Notes (Signed)
Subjective:  Patient ID: Tara Conner, female    DOB: 1946-05-02  Age: 72 y.o. MRN: [id number]  CC: Diabetes   HPI [name] presents for follow-up of her hypertension, diabetes, elevated ldl cholesterol and iron deficiency anemia.  Patient is currently taking an iron pill daily.  This is the best that she can do.  Cannot afford another copayment to see the hematologist for possible iron infusion.  She is currently taking 40 units of Lantus at night.  Her fasting sugars have been running in the 70-90 range.  She is taking the Trulicity without issue.  No issues taking the atorvastatin for LDL cholesterol.  We discussed that she is at higher risk for heart disease.  She will start a low-dose aspirin.   Outpatient Medications Prior to Visit  Medication Sig Dispense Refill  . Blood Glucose Monitoring Suppl (ACCU-CHEK AVIVA PLUS) w/Device KIT Use to test blood sugars 2 times daily, insurance does not cover one touch anymore. 1 kit 0  . ferrous sulfate 325 (65 FE) MG tablet Take 1 tablet (325 mg total) by mouth daily with breakfast. 90 tablet 1  . gabapentin (NEURONTIN) 300 MG capsule Take one at night for one week and then increase to one twice daily as tolerated. 60 capsule 3  . glucose blood (ACCU-CHEK AVIVA PLUS) test strip Use to test blood sugars two times daily. 100 each 5  . ranitidine (ZANTAC) 150 MG capsule Take 1 capsule (150 mg total) by mouth 2 (two) times daily. 180 capsule 1  . atorvastatin (LIPITOR) 40 MG tablet Take 1 tablet (40 mg total) by mouth daily. 90 tablet 2  . Dulaglutide (TRULICITY) 2.95 JO/8.4ZY SOPN Inject .65m one weekly sub cutaneously. 4 pen 5  . insulin glargine (LANTUS) 100 UNIT/ML injection Inject 0.2 mLs (20 Units total) into the skin 2 (two) times daily. Take 20 units in the morning and 20 units at night. 10 mL 3  . Lancets (ACCU-CHEK SOFT TOUCH) lancets Use to check blood sugars two times daily. 100 each 5  . lisinopril (PRINIVIL,ZESTRIL) 20 MG  tablet Take 1 tablet (20 mg total) by mouth daily. 30 tablet 2   No facility-administered medications prior to visit.     ROS Review of Systems  Constitutional: Negative for chills, diaphoresis, fatigue, fever and unexpected weight change.  HENT: Negative.   Eyes: Negative for photophobia and visual disturbance.  Respiratory: Negative.   Cardiovascular: Negative.   Gastrointestinal: Negative.  Negative for blood in stool.  Endocrine: Negative for polyphagia and polyuria.  Genitourinary: Negative for hematuria.  Musculoskeletal: Negative for arthralgias and myalgias.  Skin: Negative for pallor and rash.  Allergic/Immunologic: Negative for immunocompromised state.  Neurological: Negative for light-headedness and headaches.  Hematological: Does not bruise/bleed easily.  Psychiatric/Behavioral: Negative.     Objective:  BP 120/78   Pulse 90   Temp 98 F (36.7 C) (Oral)   Ht 5' 6.5" (1.689 m)   Wt 204 lb 6 oz (92.7 kg)   LMP  (LMP Unknown)   SpO2 97%   BMI 32.49 kg/m   BP Readings from Last 3 Encounters:  03/20/18 120/78  02/09/18 (!) 150/78  01/30/18 128/78    Wt Readings from Last 3 Encounters:  03/20/18 204 lb 6 oz (92.7 kg)  02/09/18 199 lb (90.3 kg)  01/30/18 199 lb 2 oz (90.3 kg)    Physical Exam  Constitutional: She is oriented to person, place, and time. She appears well-developed and well-nourished. No distress.  HENT:  Head: Normocephalic and atraumatic.  Right Ear: External ear normal.  Left Ear: External ear normal.  Eyes: Right eye exhibits no discharge. Left eye exhibits no discharge. No scleral icterus.  Neck: No JVD present. No tracheal deviation present.  Pulmonary/Chest: Effort normal.  Neurological: She is alert and oriented to person, place, and time.  Skin: She is not diaphoretic.  Psychiatric: She has a normal mood and affect. Her behavior is normal.    Lab Results  Component Value Date   WBC 6.9 01/30/2018   HGB 10.7 (L) 01/30/2018    HCT 31.1 (L) 01/30/2018   PLT 442.0 (H) 01/30/2018   GLUCOSE 218 (H) 01/30/2018   CHOL 142 01/30/2018   TRIG 208.0 (H) 01/30/2018   HDL 31.30 (L) 01/30/2018   LDLDIRECT 80.0 01/30/2018   ALT 9 01/30/2018   AST 12 01/30/2018   NA 136 01/30/2018   K 4.0 01/30/2018   CL 102 01/30/2018   CREATININE 1.12 01/30/2018   BUN 27 (H) 01/30/2018   CO2 25 01/30/2018   HGBA1C 10.1 (H) 01/30/2018   MICROALBUR 4.0 (H) 01/30/2018    Patient was never admitted.  Assessment & Plan:   Tara Conner was seen today for diabetes.  Diagnoses and all orders for this visit:  Essential hypertension -     lisinopril (PRINIVIL,ZESTRIL) 20 MG tablet; Take 1 tablet (20 mg total) by mouth daily. -     aspirin EC 81 MG tablet; Take 1 tablet (81 mg total) by mouth daily.  Type 2 diabetes mellitus with diabetic neuropathy, with long-term current use of insulin (HCC) -     Dulaglutide (TRULICITY) 3.81 OF/7.5ZW SOPN; Inject .48m one weekly sub cutaneously. -     insulin glargine (LANTUS) 100 UNIT/ML injection; Inject 0.4 mLs (40 Units total) into the skin at bedtime. Initial dose is 40U at night. Patient will adjust dose up or down as directed. -     Lancets (ACCU-CHEK SOFT TOUCH) lancets; Use to check blood sugars two times daily. -     lisinopril (PRINIVIL,ZESTRIL) 20 MG tablet; Take 1 tablet (20 mg total) by mouth daily. -     aspirin EC 81 MG tablet; Take 1 tablet (81 mg total) by mouth daily.  Elevated LDL cholesterol level -     atorvastatin (LIPITOR) 40 MG tablet; Take 1 tablet (40 mg total) by mouth daily. -     aspirin EC 81 MG tablet; Take 1 tablet (81 mg total) by mouth daily.  Iron deficiency anemia, unspecified iron deficiency anemia type   I have changed LJudeth PorchLivingston's insulin glargine. I am also having her start on aspirin EC. Additionally, I am having her maintain her gabapentin, ranitidine, ACCU-CHEK AVIVA PLUS, glucose blood, ferrous sulfate, atorvastatin, Dulaglutide, accu-chek soft  touch, and lisinopril.  Meds ordered this encounter  Medications  . atorvastatin (LIPITOR) 40 MG tablet    Sig: Take 1 tablet (40 mg total) by mouth daily.    Dispense:  90 tablet    Refill:  2  . Dulaglutide (TRULICITY) 02.58MNI/7.7OESOPN    Sig: Inject .733mone weekly sub cutaneously.    Dispense:  12 pen    Refill:  0  . insulin glargine (LANTUS) 100 UNIT/ML injection    Sig: Inject 0.4 mLs (40 Units total) into the skin at bedtime. Initial dose is 40U at night. Patient will adjust dose up or down as directed.    Dispense:  36.4 mL    Refill:  0  . Lancets (ACCU-CHEK  SOFT TOUCH) lancets    Sig: Use to check blood sugars two times daily.    Dispense:  100 each    Refill:  5    Dx:E11.40  . lisinopril (PRINIVIL,ZESTRIL) 20 MG tablet    Sig: Take 1 tablet (20 mg total) by mouth daily.    Dispense:  100 tablet    Refill:  0  . aspirin EC 81 MG tablet    Sig: Take 1 tablet (81 mg total) by mouth daily.    Dispense:  365 tablet    Refill:  0   Patient's goal for her fasting blood sugars are greater than 90 and less than 130.  She will adjust her Lantus upward by 4 units with blood sugars greater than 130 and down by 4 units with blood sugars less than 90.  She will consider continue Trulicity weekly.  She will start low-dose aspirin.  Asked her to take iron twice a day if she could tolerate it.  Suggested that weight loss would help all of her medical issues.  She will follow-up in 3 months and will be expecting a much improved hemoglobin A1c.  Follow-up: Return in about 3 months (around 06/20/2018).  Libby Maw, MD

## 2018-03-20 NOTE — Patient Instructions (Signed)
Iron Deficiency Anemia, Adult Iron-deficiency anemia is when you have a low amount of red blood cells or hemoglobin. This happens because you have too little iron in your body. Hemoglobin carries oxygen to parts of the body. Anemia can cause your body to not get enough oxygen. It may or may not cause symptoms. Follow these instructions at home: Medicines  Take over-the-counter and prescription medicines only as told by your doctor. This includes iron pills (supplements) and vitamins.  If you cannot handle taking iron pills by mouth, ask your doctor about getting iron through: ? A vein (intravenously). ? A shot (injection) into a muscle.  Take iron pills when your stomach is empty. If you cannot handle this, take them with food.  Do not drink milk or take antacids at the same time as your iron pills.  To prevent trouble pooping (constipation), eat fiber or take medicine (stool softener) as told by your doctor. Eating and drinking  Talk with your doctor before changing the foods you eat. He or she may tell you to eat foods that have a lot of iron, such as: ? Liver. ? Lowfat (lean) beef. ? Breads and cereals that have iron added to them (fortified breads and cereals). ? Eggs. ? Dried fruit. ? Dark green, leafy vegetables.  Drink enough fluid to keep your pee (urine) clear or pale yellow.  Eat fresh fruits and vegetables that are high in vitamin C. They help your body to use iron. Foods with a lot of vitamin C include: ? Oranges. ? Peppers. ? Tomatoes. ? Mangoes. General instructions  Return to your normal activities as told by your doctor. Ask your doctor what activities are safe for you.  Keep yourself clean, and keep things clean around you (your surroundings). Anemia can make you get sick more easily.  Keep all follow-up visits as told by your doctor. This is important. Contact a doctor if:  You feel sick to your stomach (nauseous).  You throw up (vomit).  You feel  weak.  You are sweating for no clear reason.  You have trouble pooping, such as: ? Pooping (having a bowel movement) less than 3 times a week. ? Straining to poop. ? Having poop that is hard, dry, or larger than normal. ? Feeling full or bloated. ? Pain in the lower belly. ? Not feeling better after pooping. Get help right away if:  You pass out (faint). If this happens, do not drive yourself to the hospital. Call your local emergency services (911 in the [place]  You have chest pain.  You have shortness of breath that: ? Is very bad. ? Gets worse with physical activity.  You have a fast heartbeat.  You get light-headed when getting up from sitting or lying down. This information is not intended to replace advice given to you by your health care provider. Make sure you discuss any questions you have with your health care provider. Document Released: 06/18/2010 Document Revised: 02/03/2016 Document Reviewed: 02/03/2016 Elsevier Interactive Patient Education  2017 [employer]  Iron-Rich Diet Iron is a mineral that helps your body to produce hemoglobin. Hemoglobin is a protein in your red blood cells that carries oxygen to your body's tissues. Eating too little iron may cause you to feel weak and tired, and it can increase your risk for infection. Eating enough iron is necessary for your body's metabolism, muscle function, and nervous system. Iron is naturally found in many foods. It can also be added to foods or fortified  in foods. There are two types of dietary iron:  Heme iron. Heme iron is absorbed by the body more easily than nonheme iron. Heme iron is found in meat, poultry, and fish.  Nonheme iron. Nonheme iron is found in dietary supplements, iron-fortified grains, beans, and vegetables.  You may need to follow an iron-rich diet if:  You have been diagnosed with iron deficiency or iron-deficiency anemia.  You have a condition that prevents you from absorbing dietary  iron, such as: ? Infection in your intestines. ? Celiac disease. This involves long-lasting (chronic) inflammation of your intestines.  You do not eat enough iron.  You eat a diet that is high in foods that impair iron absorption.  You have lost a lot of blood.  You have heavy bleeding during your menstrual cycle.  You are pregnant.  What is my plan? Your health care provider may help you to determine how much iron you need per day based on your condition. Generally, when a person consumes sufficient amounts of iron in the diet, the following iron needs are met:  Men. ? 9-32 years old: 11 mg per day. ? 75-74 years old: 8 mg per day.  Women. ? 21-50 years old: 15 mg per day. ? 53-18 years old: 18 mg per day. ? Over 20 years old: 8 mg per day. ? Pregnant women: 27 mg per day. ? Breastfeeding women: 9 mg per day.  What do I need to know about an iron-rich diet?  Eat fresh fruits and vegetables that are high in vitamin C along with foods that are high in iron. This will help increase the amount of iron that your body absorbs from food, especially with foods containing nonheme iron. Foods that are high in vitamin C include oranges, peppers, tomatoes, and mango.  Take iron supplements only as directed by your health care provider. Overdose of iron can be life-threatening. If you were prescribed iron supplements, take them with orange juice or a vitamin C supplement.  Cook foods in pots and pans that are made from iron.  Eat nonheme iron-containing foods alongside foods that are high in heme iron. This helps to improve your iron absorption.  Certain foods and drinks contain compounds that impair iron absorption. Avoid eating these foods in the same meal as iron-rich foods or with iron supplements. These include: ? Coffee, black tea, and red wine. ? Milk, dairy products, and foods that are high in calcium. ? Beans, soybeans, and peas. ? Whole grains.  When eating foods that  contain both nonheme iron and compounds that impair iron absorption, follow these tips to absorb iron better. ? Soak beans overnight before cooking. ? Soak whole grains overnight and drain them before using. ? Ferment flours before baking, such as using yeast in bread dough. What foods can I eat? Grains Iron-fortified breakfast cereal. Iron-fortified whole-wheat bread. Enriched rice. Sprouted grains. Vegetables Spinach. Potatoes with skin. Green peas. Broccoli. Red and green bell peppers. Fermented vegetables. Fruits Prunes. Raisins. Oranges. Strawberries. Mango. Grapefruit. Meats and Other Protein Sources Beef liver. Oysters. Beef. Shrimp. [place]. Chicken. Roy. Sardines. Chickpeas. Nuts. Tofu. Beverages Tomato juice. Fresh orange juice. Prune juice. Hibiscus tea. Fortified instant breakfast shakes. Condiments Tahini. Fermented soy sauce. Sweets and Desserts Black-strap molasses. Other Wheat germ. The items listed above may not be a complete list of recommended foods or beverages. Contact your dietitian for more options. What foods are not recommended? Grains Whole grains. Bran cereal. Bran flour. Oats. Vegetables Artichokes. Brussels sprouts. Kale.  Fruits Blueberries. Raspberries. Strawberries. Figs. Meats and Other Protein Sources Soybeans. Products made from soy protein. Dairy Milk. Cream. Cheese. Yogurt. Cottage cheese. Beverages Coffee. Black tea. Red wine. Sweets and Desserts Cocoa. Chocolate. Ice cream. Other Basil. Oregano. Parsley. The items listed above may not be a complete list of foods and beverages to avoid. Contact your dietitian for more information. This information is not intended to replace advice given to you by your health care provider. Make sure you discuss any questions you have with your health care provider. Document Released: 12/28/2004 Document Revised: 12/04/2015 Document Reviewed: 12/11/2013 Elsevier Interactive Patient Education  [employer].

## 2018-03-25 DIAGNOSIS — E119 Type 2 diabetes mellitus without complications: Secondary | ICD-10-CM | POA: Diagnosis not present

## 2018-03-25 DIAGNOSIS — I10 Essential (primary) hypertension: Secondary | ICD-10-CM | POA: Diagnosis not present

## 2018-03-27 ENCOUNTER — Ambulatory Visit: Payer: Self-pay

## 2018-03-27 ENCOUNTER — Emergency Department (HOSPITAL_COMMUNITY)
Admission: EM | Admit: 2018-03-27 | Discharge: 2018-03-27 | Disposition: A | Discharge status: Home / Self Care | Payer: Medicare HMO | Attending: Emergency Medicine | Admitting: Emergency Medicine

## 2018-03-27 ENCOUNTER — Encounter (HOSPITAL_COMMUNITY): Payer: Self-pay | Admitting: Emergency Medicine

## 2018-03-27 DIAGNOSIS — Z7982 Long term (current) use of aspirin: Secondary | ICD-10-CM | POA: Insufficient documentation

## 2018-03-27 DIAGNOSIS — Z794 Long term (current) use of insulin: Secondary | ICD-10-CM | POA: Diagnosis not present

## 2018-03-27 DIAGNOSIS — I10 Essential (primary) hypertension: Secondary | ICD-10-CM | POA: Diagnosis not present

## 2018-03-27 DIAGNOSIS — R39198 Other difficulties with micturition: Secondary | ICD-10-CM | POA: Insufficient documentation

## 2018-03-27 DIAGNOSIS — M791 Myalgia, unspecified site: Secondary | ICD-10-CM | POA: Diagnosis not present

## 2018-03-27 DIAGNOSIS — E119 Type 2 diabetes mellitus without complications: Secondary | ICD-10-CM | POA: Diagnosis not present

## 2018-03-27 DIAGNOSIS — D649 Anemia, unspecified: Secondary | ICD-10-CM | POA: Insufficient documentation

## 2018-03-27 DIAGNOSIS — Z79899 Other long term (current) drug therapy: Secondary | ICD-10-CM | POA: Diagnosis not present

## 2018-03-27 DIAGNOSIS — M7918 Myalgia, other site: Secondary | ICD-10-CM | POA: Diagnosis not present

## 2018-03-27 DIAGNOSIS — R42 Dizziness and giddiness: Secondary | ICD-10-CM | POA: Diagnosis not present

## 2018-03-27 LAB — URINALYSIS, ROUTINE W REFLEX MICROSCOPIC
Bilirubin Urine: NEGATIVE
Glucose, UA: NEGATIVE mg/dL
Hgb urine dipstick: NEGATIVE
Ketones, ur: NEGATIVE mg/dL
Nitrite: NEGATIVE
Protein, ur: NEGATIVE mg/dL
Specific Gravity, Urine: 1.017 (ref 1.005–1.030)
pH: 5 (ref 5.0–8.0)

## 2018-03-27 LAB — CBC
HCT: 32 % — ABNORMAL LOW (ref 36.0–46.0)
Hemoglobin: 10.2 g/dL — ABNORMAL LOW (ref 12.0–15.0)
MCH: 27.8 pg (ref 26.0–34.0)
MCHC: 31.9 g/dL (ref 30.0–36.0)
MCV: 87.2 fL (ref 80.0–100.0)
Platelets: 531 10*3/uL — ABNORMAL HIGH (ref 150–400)
RBC: 3.67 MIL/uL — ABNORMAL LOW (ref 3.87–5.11)
RDW: 12 % (ref 11.5–15.5)
WBC: 7.7 10*3/uL (ref 4.0–10.5)
nRBC: 0 % (ref 0.0–0.2)

## 2018-03-27 LAB — BASIC METABOLIC PANEL
Anion gap: 9 (ref 5–15)
BUN: 21 mg/dL (ref 8–23)
CO2: 25 mmol/L (ref 22–32)
Calcium: 9.3 mg/dL (ref 8.9–10.3)
Chloride: 104 mmol/L (ref 98–111)
Creatinine, Ser: 1.01 mg/dL — ABNORMAL HIGH (ref 0.44–1.00)
GFR calc Af Amer: >60 mL/min (ref >60)
GFR calc non Af Amer: 55 mL/min — ABNORMAL LOW (ref >60)
Glucose, Bld: 194 mg/dL — ABNORMAL HIGH (ref 70–99)
Potassium: 3.7 mmol/L (ref 3.5–5.1)
Sodium: 138 mmol/L (ref 135–145)

## 2018-03-27 LAB — CBG MONITORING, ED: Glucose-Capillary: 123 mg/dL — ABNORMAL HIGH (ref 70–99)

## 2018-03-27 NOTE — Telephone Encounter (Signed)
Pt calling with c/o lightheadedness and "just not feeling good for the past 3 weeks. Pt stated that she has felt bad since she had the flu shot. Pt's BP elevated: 155/85, 177/94 and 171/93 heart rate 100. Pt took BP on home wrist BP monitor. Pt emotional during call stating, "just when I think I'm feeling better, I feel bad again." Emotional support given during call.  Pt stated her lightheadedness feels like pressure to her head and neck. Pt stated the lightheadedness feels better when she is sitting and worse with standing. Pt denies fever, chest pain, vomiting. Pt stated she had 1 episode of diarrhea. No bleeding per pt.   Pt advised to go to the ED for evaluation. Care advice given and pt verbalized understanding.   Reason for Disposition . [1] Systolic BP  >= 160 OR Diastolic >= 100 AND [2] cardiac or neurologic symptoms (e.g., chest pain, difficulty breathing, unsteady gait, blurred vision)  Answer Assessment - Initial Assessment Questions 1. DESCRIPTION: "Describe your dizziness."     Like a head pressure to head and neck 2. LIGHTHEADED: "Do you feel lightheaded?" (e.g., somewhat faint, woozy, weak upon standing)     Yes- sitting down she feels better but feels lightheaded when standing 3. VERTIGO: "Do you feel like either you or the room is spinning or tilting?" (i.e. vertigo)     No but stated she felt vertigo Saturday 4. SEVERITY: "How bad is it?"  "Do you feel like you are going to faint?" "Can you stand and walk?"   - MILD - walking normally   - MODERATE - interferes with normal activities (e.g., work, school)    - SEVERE - unable to stand, requires support to walk, feels like passing out now.      Moderate 5. ONSET:  "When did the dizziness begin?"     3 weeks ago after taking the Flu shot 6. AGGRAVATING FACTORS: "Does anything make it worse?" (e.g., standing, change in head position)     standing 7. HEART RATE: "Can you tell me your heart rate?" "How many beats in 15 seconds?"   (Note: not all patients can do this)       100 8. CAUSE: "What do you think is causing the dizziness?"     High Blood Pressure 9. RECURRENT SYMPTOM: "Have you had dizziness before?" If so, ask: "When was the last time?" "What happened that time?"     no 10. OTHER SYMPTOMS: "Do you have any other symptoms?" (e.g., fever, chest pain, vomiting, diarrhea, bleeding)       lightheadedness 11. PREGNANCY: "Is there any chance you are pregnant?" "When was your last menstrual period?"       n/a  Answer Assessment - Initial Assessment Questions 1. BLOOD PRESSURE: "What is the blood pressure?" "Did you take at least two measurements 5 minutes apart?"   155/85  177/94  2. ONSET: "When did you take your blood pressure?"     This am -1:30 pm 3. HOW: "How did you obtain the blood pressure?" (e.g., visiting nurse, automatic home BP monitor)     Wrist automatic BP monitor 4. HISTORY: "Do you have a history of high blood pressure?"     yes 5. MEDICATIONS: "Are you taking any medications for blood pressure?" "Have you missed any doses recently?"     Yes-no- Saturday night took BP that night and then again in the morning 6. OTHER SYMPTOMS: "Do you have any symptoms?" (e.g., headache, chest pain, blurred vision, difficulty breathing,  weakness)   Slight headache, lightheaded  7. PREGNANCY: "Is there any chance you are pregnant?" "When was your last menstrual period?"     n/a  Protocols used: HIGH BLOOD PRESSURE-A-AH, DIZZINESS - [facility]

## 2018-03-27 NOTE — Discharge Instructions (Addendum)
Take Tylenol if needed for pain.  Make sure you are getting plenty of rest drink a lot of fluids and follow your current medical instructions.  Take your blood pressure pill, l when you get home this evening.

## 2018-03-27 NOTE — ED Triage Notes (Signed)
Patient here from home with complaints of hypertension and lightheadedness x3 weeks. Reports all started after flu shot. Generalized body aches. Denies n/v.

## 2018-03-27 NOTE — ED Provider Notes (Signed)
[facility] DEPT Provider Note   CSN: [id number] Arrival date & time: 03/27/18  1754     History   Chief Complaint Chief Complaint  Patient presents with  . Dizziness  . Hypertension    HPI Tara Conner is a 72 y.o. female.  HPI   She presents for evaluation of generalized achiness for 3 weeks was started immediately after her high-dose flu shot.  She is also been following her blood pressure and found to be high.  Has intermittent periods of dizziness.  She denies headache, focal weakness or paresthesia.  Tonight she urinated" it was hard to go."  Denies dysuria, hematuria, constipation or diarrhea.  She saw her PCP 1 week ago for the same problem at that time was told she was anemic and instructed to increase her iron intake by diet, and continue taking her daily iron tablet.  She was recently started on Trulicity to improve her hemoglobin H1C.  There are no other known modifying factors.  History reviewed. No pertinent past medical history.  Patient Active Problem List   Diagnosis Date Noted  . Iron deficiency anemia 02/12/2018  . Urinary tract infection with hematuria 02/12/2018  . Abnormal finding in urine 02/09/2018  . Anemia 02/09/2018  . Essential hypertension 01/30/2018  . Type 2 diabetes mellitus with diabetic neuropathy, with long-term current use of insulin ([facility]) 01/30/2018  . Elevated LDL cholesterol level 01/30/2018  . Screen for colon cancer 01/30/2018    History reviewed. No pertinent surgical history.   OB History   None      Home Medications    Prior to Admission medications   Medication Sig Start Date End Date Taking? Authorizing Provider  atorvastatin (LIPITOR) 40 MG tablet Take 1 tablet (40 mg total) by mouth daily. 03/20/18 06/18/18 Yes Libby Maw, MD  ferrous sulfate 325 (65 FE) MG tablet Take 1 tablet (325 mg total) by mouth daily with breakfast. 02/12/18  Yes Libby Maw, MD    gabapentin (NEURONTIN) 300 MG capsule Take one at night for one week and then increase to one twice daily as tolerated. Patient taking differently: Take 300 mg by mouth 2 (two) times daily as needed (pain).  01/30/18  Yes Libby Maw, MD  insulin glargine (LANTUS) 100 UNIT/ML injection Inject 0.4 mLs (40 Units total) into the skin at bedtime. Initial dose is 40U at night. Patient will adjust dose up or down as directed. 03/20/18 06/19/18 Yes Libby Maw, MD  lisinopril (PRINIVIL,ZESTRIL) 20 MG tablet Take 1 tablet (20 mg total) by mouth daily. 03/20/18 06/18/18 Yes Libby Maw, MD  Multiple Vitamins-Minerals (MULTIVITAMIN ADULT PO) Take 1 tablet by mouth daily.   Yes [provider]  ranitidine (ZANTAC) 150 MG capsule Take 1 capsule (150 mg total) by mouth 2 (two) times daily. Patient taking differently: Take 150 mg by mouth 2 (two) times daily as needed for heartburn.  01/31/18  Yes Libby Maw, MD  aspirin EC 81 MG tablet Take 1 tablet (81 mg total) by mouth daily. 03/20/18 03/20/19  Libby Maw, MD  Blood Glucose Monitoring Suppl (ACCU-CHEK AVIVA PLUS) w/Device KIT Use to test blood sugars 2 times daily, insurance does not cover one touch anymore. 02/02/18   Libby Maw, MD  Dulaglutide (TRULICITY) 6.01 UX/3.2TF SOPN Inject .63m one weekly sub cutaneously. 03/20/18   KLibby Maw MD  glucose blood (ACCU-CHEK AVIVA PLUS) test strip Use to test blood sugars two times daily. 02/02/18  Libby Maw, MD  Lancets (ACCU-CHEK SOFT [facility]) lancets Use to check blood sugars two times daily. 03/20/18   Libby Maw, MD    Family History No family history on file.  Social History Social History   Tobacco Use  . Smoking status: Never Smoker  . Smokeless tobacco: Never Used  Substance Use Topics  . Alcohol use: Never    Frequency: Never  . Drug use: Never     Allergies   Patient has no known  allergies.   Review of Systems Review of Systems  All other systems reviewed and are negative.    Physical Exam Updated Vital Signs BP (!) 182/93   Pulse 91   Temp 98.2 F (36.8 C) (Oral)   Resp (!) 21   LMP  (LMP Unknown)   SpO2 100%   Physical Exam  Constitutional: She is oriented to person, place, and time. She appears well-developed and well-nourished. No distress.  HENT:  Head: Normocephalic and atraumatic.  Eyes: Pupils are equal, round, and reactive to light. Conjunctivae and EOM are normal.  Neck: Normal range of motion and phonation normal. Neck supple.  Cardiovascular: Normal rate and regular rhythm.  Pulmonary/Chest: Effort normal and breath sounds normal. She exhibits no tenderness.  Abdominal: Soft. She exhibits no distension. There is no tenderness. There is no guarding.  Musculoskeletal: Normal range of motion.  Normal strength arms and legs bilaterally.  Neurological: She is alert and oriented to person, place, and time. She exhibits normal muscle tone.  No dysarthria, aphasia or nystagmus.  No ataxia.  No pronator drift.  Skin: Skin is warm and dry.  Psychiatric: She has a normal mood and affect. Her behavior is normal. Judgment and thought content normal.  Nursing note and vitals reviewed.    ED Treatments / Results  Labs (all labs ordered are listed, but only abnormal results are displayed) Labs Reviewed  BASIC METABOLIC PANEL - Abnormal; Notable for the following components:      Result Value   Glucose, Bld 194 (*)    Creatinine, Ser 1.01 (*)    GFR calc non Af Amer 55 (*)    All other components within normal limits  CBC - Abnormal; Notable for the following components:   RBC 3.67 (*)    Hemoglobin 10.2 (*)    HCT 32.0 (*)    Platelets 531 (*)    All other components within normal limits  URINALYSIS, ROUTINE W REFLEX MICROSCOPIC - Abnormal; Notable for the following components:   APPearance HAZY (*)    Leukocytes, UA LARGE (*)    Bacteria,  UA RARE (*)    All other components within normal limits  CBG MONITORING, ED - Abnormal; Notable for the following components:   Glucose-Capillary 123 (*)    All other components within normal limits  URINE CULTURE    EKG EKG Interpretation  Date/Time:  Tuesday March 27 2018 18:14:14 EDT Ventricular Rate:  91 PR Interval:    QRS Duration: 82 QT Interval:  362 QTC Calculation: 446 R Axis:   50 Text Interpretation:  Sinus rhythm Baseline wander in lead(s) V1 No old tracing to compare Confirmed by Daleen Bo [phone]) on 03/27/2018 10:08:42 PM   Radiology No results found.  Procedures Procedures (including critical care time)  Medications Ordered in ED Medications - No data to display   Initial Impression / Assessment and Plan / ED Course  I have reviewed the triage vital signs and the nursing notes.  Pertinent labs &  imaging results that were available during my care of the patient were reviewed by me and considered in my medical decision making (see chart for details).  Clinical Course as of Mar 27 2242  Tue Mar 27, 2018  2219 High  CBG monitoring, ED(!) [EW]  2220 Normal except increase leukocytes, red cells and white cells.  Urinalysis, Routine w reflex microscopic(!) [EW]  2220 Normal except glucose high, creatinine high, GFR low  Basic metabolic panel(!) [EW]  8502 Normal except hemoglobin low  CBC(!) [EW]    Clinical Course User Index [EW] Daleen Bo, MD    Patient Vitals for the past 24 hrs:  BP Temp Temp src Pulse Resp SpO2  03/27/18 2200 (!) 182/93 - - 91 (!) 21 100 %  03/27/18 2146 (!) 185/98 - - 95 18 99 %  03/27/18 1809 (!) 178/87 98.2 F (36.8 C) Oral 87 16 100 %    10:40 PM Reevaluation with update and discussion. After initial assessment and treatment, an updated evaluation reveals no change in clinical status.  Findings discussed with patient and family member, all questions answered. Daleen Bo   Medical Decision Making: Specific  achiness, likely myalgia postimmunization.  Doubt hypertensive urgency.  Patient has not yet taken her nighttime antihypertensive medication.  Doubt serious bacterial infection, metabolic instability or impending vascular collapse.  CRITICAL CARE-no Performed by: Daleen Bo   Nursing Notes Reviewed/ Care Coordinated Applicable Imaging Reviewed Interpretation of Laboratory Data incorporated into ED treatment  The patient appears reasonably screened and/or stabilized for discharge and I doubt any other medical condition or other [facility] requiring further screening, evaluation, or treatment in the ED at this time prior to discharge.  Plan: Home Medications-continue usual medications; Home Treatments-rest, fluids; return here if the recommended treatment, does not improve the symptoms; Recommended follow up-PCP, PRN   Final Clinical Impressions(s) / ED Diagnoses   Final diagnoses:  Myalgia  Anemia, unspecified type    ED Discharge Orders    None       Daleen Bo, MD 03/27/18 2243

## 2018-03-29 LAB — URINE CULTURE: Culture: 50000 — AB

## 2018-03-30 ENCOUNTER — Telehealth: Payer: Self-pay | Admitting: Emergency Medicine

## 2018-03-30 NOTE — Telephone Encounter (Signed)
Post ED Visit - Positive Culture Follow-up  Culture report reviewed by antimicrobial stewardship pharmacist:  []  Enzo Bi, Pharm.D. [x]  Celedonio Miyamoto, Pharm.D., BCPS AQ-ID []  Garvin Fila, Pharm.D., BCPS []  Georgina Pillion, Pharm.D., BCPS []  [place], [place].D., BCPS, AAHIVP []  Estella Husk, Pharm.D., BCPS, AAHIVP []  Lysle Pearl, PharmD, BCPS []  Phillips Climes, PharmD, BCPS []  Agapito Games, PharmD, BCPS []  Verlan Friends, PharmD  Positive urine culture Treated with none, asymptomatic,  no further patient follow-up is required at this time.  Berle Mull 03/30/2018, 9:48 AM

## 2018-04-05 ENCOUNTER — Encounter: Payer: Self-pay | Admitting: Family Medicine

## 2018-04-06 ENCOUNTER — Other Ambulatory Visit: Payer: Self-pay

## 2018-04-06 MED ORDER — "NEEDLE (DISP) 30G X 1"" MISC": 3 refills | Status: DC

## 2018-04-07 ENCOUNTER — Observation Stay (HOSPITAL_COMMUNITY): Payer: Medicare Other

## 2018-04-07 ENCOUNTER — Emergency Department (HOSPITAL_COMMUNITY): Payer: Medicare Other

## 2018-04-07 ENCOUNTER — Inpatient Hospital Stay (HOSPITAL_COMMUNITY)
Admission: EM | Admit: 2018-04-07 | Discharge: 2018-04-10 | DRG: 065 | Disposition: A | Discharge status: Home Health | Payer: Medicare Other | Attending: Internal Medicine | Admitting: Internal Medicine

## 2018-04-07 DIAGNOSIS — R29810 Facial weakness: Secondary | ICD-10-CM | POA: Diagnosis present

## 2018-04-07 DIAGNOSIS — I10 Essential (primary) hypertension: Secondary | ICD-10-CM | POA: Diagnosis not present

## 2018-04-07 DIAGNOSIS — E1165 Type 2 diabetes mellitus with hyperglycemia: Secondary | ICD-10-CM | POA: Diagnosis present

## 2018-04-07 DIAGNOSIS — D638 Anemia in other chronic diseases classified elsewhere: Secondary | ICD-10-CM

## 2018-04-07 DIAGNOSIS — K219 Gastro-esophageal reflux disease without esophagitis: Secondary | ICD-10-CM | POA: Diagnosis present

## 2018-04-07 DIAGNOSIS — I6381 Other cerebral infarction due to occlusion or stenosis of small artery: Principal | ICD-10-CM | POA: Diagnosis present

## 2018-04-07 DIAGNOSIS — I639 Cerebral infarction, unspecified: Secondary | ICD-10-CM

## 2018-04-07 DIAGNOSIS — G459 Transient cerebral ischemic attack, unspecified: Secondary | ICD-10-CM | POA: Diagnosis not present

## 2018-04-07 DIAGNOSIS — Z9114 Patient's other noncompliance with medication regimen: Secondary | ICD-10-CM

## 2018-04-07 DIAGNOSIS — H538 Other visual disturbances: Secondary | ICD-10-CM | POA: Diagnosis present

## 2018-04-07 DIAGNOSIS — E785 Hyperlipidemia, unspecified: Secondary | ICD-10-CM | POA: Diagnosis present

## 2018-04-07 DIAGNOSIS — E1169 Type 2 diabetes mellitus with other specified complication: Secondary | ICD-10-CM | POA: Diagnosis not present

## 2018-04-07 DIAGNOSIS — R55 Syncope and collapse: Secondary | ICD-10-CM | POA: Diagnosis not present

## 2018-04-07 DIAGNOSIS — Z7982 Long term (current) use of aspirin: Secondary | ICD-10-CM

## 2018-04-07 DIAGNOSIS — R29703 NIHSS score 3: Secondary | ICD-10-CM | POA: Diagnosis not present

## 2018-04-07 DIAGNOSIS — Z79899 Other long term (current) drug therapy: Secondary | ICD-10-CM

## 2018-04-07 DIAGNOSIS — E1140 Type 2 diabetes mellitus with diabetic neuropathy, unspecified: Secondary | ICD-10-CM

## 2018-04-07 DIAGNOSIS — N179 Acute kidney failure, unspecified: Secondary | ICD-10-CM | POA: Diagnosis present

## 2018-04-07 DIAGNOSIS — Z8673 Personal history of transient ischemic attack (TIA), and cerebral infarction without residual deficits: Secondary | ICD-10-CM

## 2018-04-07 DIAGNOSIS — Z794 Long term (current) use of insulin: Secondary | ICD-10-CM

## 2018-04-07 DIAGNOSIS — E7800 Pure hypercholesterolemia, unspecified: Secondary | ICD-10-CM

## 2018-04-07 DIAGNOSIS — E1151 Type 2 diabetes mellitus with diabetic peripheral angiopathy without gangrene: Secondary | ICD-10-CM | POA: Diagnosis present

## 2018-04-07 DIAGNOSIS — Z882 Allergy status to sulfonamides status: Secondary | ICD-10-CM

## 2018-04-07 DIAGNOSIS — R29702 NIHSS score 2: Secondary | ICD-10-CM | POA: Diagnosis present

## 2018-04-07 LAB — COMPREHENSIVE METABOLIC PANEL
ALT: 17 U/L (ref 0–44)
AST: 21 U/L (ref 15–41)
Albumin: 3.5 g/dL (ref 3.5–5.0)
Alkaline Phosphatase: 80 U/L (ref 38–126)
Anion gap: 11 (ref 5–15)
BUN: 18 mg/dL (ref 8–23)
CO2: 23 mmol/L (ref 22–32)
Calcium: 9.2 mg/dL (ref 8.9–10.3)
Chloride: 104 mmol/L (ref 98–111)
Creatinine, Ser: 1.21 mg/dL — ABNORMAL HIGH (ref 0.44–1.00)
GFR calc Af Amer: 51 mL/min — ABNORMAL LOW (ref >60)
GFR calc non Af Amer: 44 mL/min — ABNORMAL LOW (ref >60)
Glucose, Bld: 206 mg/dL — ABNORMAL HIGH (ref 70–99)
Potassium: 3.5 mmol/L (ref 3.5–5.1)
Sodium: 138 mmol/L (ref 135–145)
Total Bilirubin: 0.6 mg/dL (ref 0.3–1.2)
Total Protein: 8.1 g/dL (ref 6.5–8.1)

## 2018-04-07 LAB — CBC WITH DIFFERENTIAL/PLATELET
Abs Immature Granulocytes: 0.06 10*3/uL (ref 0.00–0.07)
Basophils Absolute: 0.1 10*3/uL (ref 0.0–0.1)
Basophils Relative: 1 %
Eosinophils Absolute: 0.2 10*3/uL (ref 0.0–0.5)
Eosinophils Relative: 1 %
HCT: 33.4 % — ABNORMAL LOW (ref 36.0–46.0)
Hemoglobin: 10.7 g/dL — ABNORMAL LOW (ref 12.0–15.0)
Immature Granulocytes: 1 %
Lymphocytes Relative: 34 %
Lymphs Abs: 4.2 10*3/uL — ABNORMAL HIGH (ref 0.7–4.0)
MCH: 27.5 pg (ref 26.0–34.0)
MCHC: 32 g/dL (ref 30.0–36.0)
MCV: 85.9 fL (ref 80.0–100.0)
Monocytes Absolute: 0.9 10*3/uL (ref 0.1–1.0)
Monocytes Relative: 7 %
Neutro Abs: 6.8 10*3/uL (ref 1.7–7.7)
Neutrophils Relative %: 56 %
Platelets: 449 10*3/uL — ABNORMAL HIGH (ref 150–400)
RBC: 3.89 MIL/uL (ref 3.87–5.11)
RDW: 12 % (ref 11.5–15.5)
WBC: 12.2 10*3/uL — ABNORMAL HIGH (ref 4.0–10.5)
nRBC: 0 % (ref 0.0–0.2)

## 2018-04-07 LAB — GLUCOSE, CAPILLARY: Glucose-Capillary: 134 mg/dL — ABNORMAL HIGH (ref 70–99)

## 2018-04-07 LAB — POC OCCULT BLOOD, ED: Fecal Occult Bld: NEGATIVE

## 2018-04-07 MED ORDER — INSULIN ASPART 100 UNIT/ML ~~LOC~~ SOLN: 0.0000 [IU] | Freq: Every day | SUBCUTANEOUS | Status: DC

## 2018-04-07 MED ORDER — LISINOPRIL 20 MG PO TABS
20.0000 mg | ORAL_TABLET | Freq: Every day | ORAL | Status: DC
  Administered 2018-04-07 – 2018-04-08 (×2): 20 mg via ORAL
  Filled 2018-04-07 (×2): qty 1

## 2018-04-07 MED ORDER — ASPIRIN 300 MG RE SUPP: 300.0000 mg | Freq: Every day | RECTAL | Status: DC

## 2018-04-07 MED ORDER — INSULIN GLARGINE 100 UNIT/ML ~~LOC~~ SOLN
25.0000 [IU] | Freq: Every day | SUBCUTANEOUS | Status: DC
  Administered 2018-04-07 – 2018-04-09 (×3): 25 [IU] via SUBCUTANEOUS
  Filled 2018-04-07 (×5): qty 0.25

## 2018-04-07 MED ORDER — SENNOSIDES-DOCUSATE SODIUM 8.6-50 MG PO TABS: 1.0000 | ORAL_TABLET | Freq: Every evening | ORAL | Status: DC | PRN

## 2018-04-07 MED ORDER — ACETAMINOPHEN 650 MG RE SUPP: 650.0000 mg | RECTAL | Status: DC | PRN

## 2018-04-07 MED ORDER — ADULT MULTIVITAMIN W/MINERALS CH
ORAL_TABLET | Freq: Every day | ORAL | Status: DC
  Administered 2018-04-08 – 2018-04-10 (×3): 1 via ORAL
  Filled 2018-04-07 (×3): qty 1

## 2018-04-07 MED ORDER — IOPAMIDOL (ISOVUE-370) INJECTION 76%
INTRAVENOUS | Status: AC
  Administered 2018-04-07: 100 mL via INTRAVENOUS
  Filled 2018-04-07: qty 100

## 2018-04-07 MED ORDER — STROKE: EARLY STAGES OF RECOVERY BOOK
Freq: Once | Status: AC
  Administered 2018-04-07: 18:00:00
  Filled 2018-04-07: qty 1

## 2018-04-07 MED ORDER — ASPIRIN EC 325 MG PO TBEC: 325.0000 mg | DELAYED_RELEASE_TABLET | Freq: Every day | ORAL | Status: DC

## 2018-04-07 MED ORDER — PSYLLIUM 51.7 % PO PACK: 5.0000 g | PACK | Freq: Every day | ORAL | Status: DC | PRN

## 2018-04-07 MED ORDER — INSULIN ASPART 100 UNIT/ML ~~LOC~~ SOLN
0.0000 [IU] | Freq: Three times a day (TID) | SUBCUTANEOUS | Status: DC
  Administered 2018-04-08: 3 [IU] via SUBCUTANEOUS
  Administered 2018-04-08: 2 [IU] via SUBCUTANEOUS

## 2018-04-07 MED ORDER — INSULIN GLARGINE 100 UNIT/ML ~~LOC~~ SOLN
40.0000 [IU] | Freq: Every day | SUBCUTANEOUS | Status: DC
  Filled 2018-04-07: qty 0.4

## 2018-04-07 MED ORDER — ACETAMINOPHEN 160 MG/5ML PO SOLN: 650.0000 mg | ORAL | Status: DC | PRN

## 2018-04-07 MED ORDER — FAMOTIDINE 20 MG PO TABS
20.0000 mg | ORAL_TABLET | Freq: Every day | ORAL | Status: DC
  Administered 2018-04-07 – 2018-04-10 (×4): 20 mg via ORAL
  Filled 2018-04-07 (×4): qty 1

## 2018-04-07 MED ORDER — ATORVASTATIN CALCIUM 40 MG PO TABS
40.0000 mg | ORAL_TABLET | Freq: Every day | ORAL | Status: DC
  Administered 2018-04-07 – 2018-04-10 (×4): 40 mg via ORAL
  Filled 2018-04-07 (×4): qty 1

## 2018-04-07 MED ORDER — ACETAMINOPHEN 325 MG PO TABS
650.0000 mg | ORAL_TABLET | ORAL | Status: DC | PRN
  Administered 2018-04-07: 650 mg via ORAL
  Filled 2018-04-07: qty 2

## 2018-04-07 MED ORDER — GABAPENTIN 300 MG PO CAPS: 300.0000 mg | ORAL_CAPSULE | Freq: Every evening | ORAL | Status: DC | PRN

## 2018-04-07 MED ORDER — IOPAMIDOL (ISOVUE-370) INJECTION 76%: 75.0000 mL | Freq: Once | INTRAVENOUS | Status: DC | PRN

## 2018-04-07 MED ORDER — ASPIRIN 325 MG PO TABS
325.0000 mg | ORAL_TABLET | Freq: Every day | ORAL | Status: DC
  Administered 2018-04-07 – 2018-04-09 (×3): 325 mg via ORAL
  Filled 2018-04-07 (×3): qty 1

## 2018-04-07 MED ORDER — SODIUM CHLORIDE 0.9 % IV BOLUS
500.0000 mL | Freq: Once | INTRAVENOUS | Status: AC
  Administered 2018-04-07: 500 mL via INTRAVENOUS

## 2018-04-07 MED ORDER — ENOXAPARIN SODIUM 40 MG/0.4ML ~~LOC~~ SOLN
40.0000 mg | SUBCUTANEOUS | Status: DC
  Administered 2018-04-07 – 2018-04-09 (×3): 40 mg via SUBCUTANEOUS
  Filled 2018-04-07 (×3): qty 0.4

## 2018-04-07 MED ORDER — FERROUS SULFATE 325 (65 FE) MG PO TABS: 325.0000 mg | ORAL_TABLET | Freq: Every day | ORAL | Status: DC

## 2018-04-07 MED ORDER — DULAGLUTIDE 0.75 MG/0.5ML ~~LOC~~ SOAJ: 0.7500 mg | SUBCUTANEOUS | Status: DC

## 2018-04-07 NOTE — ED Triage Notes (Signed)
Pt arrives via EMS after a syncopal episode at church, EMS reports she did not fall and passed out for approx 30 seconds. Reports nausea and vomiting and sensation of needing bowel movement since becoming more alert. Pt alert x4 at this time. Reports HTN and body aches for 3 weeks since flu shot.  EMS gave 4MG  zofran pta.

## 2018-04-07 NOTE — ED Provider Notes (Signed)
4:41 PM Spoke to Dr. Wilford Corner of neurology about this patient. He will see this patient in consult during her admission. No emergent recommendations at this time.     Dietrich Pates, PA-C 04/07/18 1642    Charlynne Pander, MD 04/07/18 [phone]

## 2018-04-07 NOTE — ED Provider Notes (Addendum)
Complains of generalized weakness for approximately the past 3 weeks since having received a flu shot.  She reports lightheadedness.  She had a syncopal event while in a seated position her today.  She admits to a few episodes of diarrhea 1 or 2 days ago.  Denies black stools or blood per rectum.  Denies pain anywhere.  She admits to nausea, no vomiting.  Denies chest pain denies abdominal pain.  She does complain of some dizziness presently, meaning sensation of room spinning worse with changing positions improved with remaining still.  On exam she is alert Glasgow Coma Score 15  lungs clear to auscultation heart regular rate and rhythm tachycardic abdomen obese, nontender.  Rectum normal tone brown stool no gross blood.  Extremities without edema.  Neurologic cranial nerves II through XII grossly intact moves all extremities well.  Skin cool and clammy.     Doug Sou, MD 04/07/18 1257 1:15 PM notified by nurse that patient had left sided mouth droop.  She reports that she is had intermittent left-arm and left leg weakness for the past 2 weeks.  She does not feel weak presently.  On reexamination she does have slight left mouth droop.  Otherwise moves all extremities well.  Speech is clear.  Handling secretions well.    In light of tachycardia, and syncope, code stroke not called patient may be experiencing TIAs over the past few weeks   Doug Sou, MD 04/07/18 1657 CRITICAL CARE Performed by: Doug Sou Total critical care time: 30 minutes Critical care time was exclusive of separately billable procedures and treating other patients. Critical care was necessary to treat or prevent imminent or life-threatening deterioration. Critical care was time spent personally by me on the following activities: development of treatment plan with patient and/or surrogate as well as nursing, discussions with consultants, evaluation of patient's response to treatment, examination of patient,  obtaining history from patient or surrogate, ordering and performing treatments and interventions, ordering and review of laboratory studies, ordering and review of radiographic studies, pulse oximetry and re-evaluation of patient's condition.   Doug Sou, MD 04/07/18 [phone]

## 2018-04-07 NOTE — Consult Note (Addendum)
NEURO HOSPITALIST  CONSULT   Requesting Physician: Dr. Laren Everts    Chief Complaint: syncopal episode  History obtained from:  Patient      HPI:                                                                                                                                         Tara Conner is an 72 y.o. female  With PMH significant for DM 2, HTN presented to the ED  With complaints of syncope, n/v, dizziness, generalized weakness and transient left facial weakness, facial numbness.  Per patient she has not been feeling herself for the past 3-4 weeks since she received a high dose flu shot.  Since then she has had some some recurrent HA (relieved with tylenol and advil), some dizziness (described as room spinning and sometimes lightheaded), and diarrhea that have completely resolved in between episodes.  Today while she was at church and standing she had a sudden onset of whole body weakness. So she asked to lean over on her son. She apparently then lost consciousness for about 30 seconds before coming around. Her son and others helped lower her to floor. She did not fall nor hit her head. She then regained consciousness and then had nausea and vomiting. This has never happened before and she has never had a stroke before. Denies any HA, CP, SOB, blurred vision during this episode.  Over the past 3 -4 weeks she has had intermittent episodes of dizziness/ lightheaded,  Blurry vision. She also states that her HA will begin in the nape of her neck and radiate up her neck to about the middle of her neck. This comes and goes and she is completely normal in between episodes.  In the ED she had an episode of left facial numbness, and some left facial droop witnessed by ER RN - lasted 20 min.  Admitted for stroke/TIA work up.  Denies taking ASA daily.  ED course:  BP:161/100 BG: 206 CTH: no hemorrhage  Date last known well: Date: 04/07/2018 Time  last known well: UNCLEAR--few weeks ago tPA Given: No: outside of window Modified Rankin: Rankin Score=0 NIHSS:2; facial droop and left leg drift  PMH DM HTN   No family history on file.       Social History:  reports that she has never smoked. She has never used smokeless tobacco. She reports that she does not drink alcohol or use drugs.  Allergies:  Allergies  Allergen Reactions  . Sulfa Antibiotics Other (See Comments)    Pt unable to report reaction    Medications:  No current facility-administered medications for this encounter.    Current Outpatient Medications  Medication Sig Dispense Refill  . atorvastatin (LIPITOR) 40 MG tablet Take 1 tablet (40 mg total) by mouth daily. 90 tablet 2  . Dulaglutide (TRULICITY) 0.35 WS/5.6CL SOPN Inject .30m one weekly sub cutaneously. (Patient taking differently: Inject 0.75 mg into the skin every 7 (seven) days. ) 12 pen 0  . gabapentin (NEURONTIN) 300 MG capsule Take one at night for one week and then increase to one twice daily as tolerated. (Patient taking differently: Take 300 mg by mouth at bedtime as needed (for neuropathy). ) 60 capsule 3  . Glycerin-Polysorbate 80 (REFRESH DRY EYE THERAPY OP) Place 1-2 drops into both eyes 3 (three) times daily as needed (for dryness).    . insulin glargine (LANTUS) 100 UNIT/ML injection Inject 0.4 mLs (40 Units total) into the skin at bedtime. Initial dose is 40U at night. Patient will adjust dose up or down as directed. 36.4 mL 0  . lisinopril (PRINIVIL,ZESTRIL) 20 MG tablet Take 1 tablet (20 mg total) by mouth daily. (Patient taking differently: Take 20 mg by mouth at bedtime. ) 100 tablet 0  . Multiple Vitamins-Minerals (MULTIVITAMIN ADULT PO) Take 1 tablet by mouth daily.    . Psyllium (METAMUCIL FIBER PO) Take 5-10 g by mouth daily as needed (for constipation).    .  ranitidine (ZANTAC) 150 MG capsule Take 1 capsule (150 mg total) by mouth 2 (two) times daily. (Patient taking differently: Take 150 mg by mouth 2 (two) times daily as needed for heartburn. ) 180 capsule 1  . aspirin EC 81 MG tablet Take 1 tablet (81 mg total) by mouth daily. (Patient not taking: Reported on 04/07/2018) 365 tablet 0  . Blood Glucose Monitoring Suppl (ACCU-CHEK AVIVA PLUS) w/Device KIT Use to test blood sugars 2 times daily, insurance does not cover one touch anymore. 1 kit 0  . ferrous sulfate 325 (65 FE) MG tablet Take 1 tablet (325 mg total) by mouth daily with breakfast. (Patient taking differently: Take 325-650 mg by mouth daily with breakfast. ) 90 tablet 1  . glucose blood (ACCU-CHEK AVIVA PLUS) test strip Use to test blood sugars two times daily. 100 each 5  . Lancets (ACCU-CHEK SOFT TOUCH) lancets Use to check blood sugars two times daily. 100 each 5  . NEEDLE, DISP, 30 G (B-D DISP NEEDLE 30GX1") 30G X 1" MISC To use with Lantus. 100 each 3    ROS:                                                                                                                                       ROS was performed and is negative except as noted in HPI  General Examination:  Blood pressure (!) 161/87, pulse 94, temperature 98.1 F (36.7 C), temperature source Oral, resp. rate 19, height 5' 6.5" (1.689 m), weight 90.7 kg, SpO2 97 %. GEN: WD WN NAD HEENT-  Normocephalic, no lesions, without obvious abnormality.  Normal external eye and conjunctiva.  Cardiovascular- S1-S2 audible, pulses palpable throughout   Lungs-no rhonchi or wheezing noted, no excessive working breathing.  Saturations within normal limits on RA Abdomen- All 4 quadrants palpated and nontender Extremities- Warm, dry and intact Musculoskeletal-no joint tenderness, deformity or swelling Skin-warm and dry, no  hyperpigmentation, vitiligo, or suspicious lesions  Neurological Examination Mental Status: Alert, oriented/ name/age/ month/year/ place thought content appropriate.  Speech fluent without evidence of aphasia.  Able to follow  commands without difficulty. Cranial Nerves: II:  Visual fields grossly normal,  III,IV, VI: ptosis not present, extra-ocular motions intact bilaterally, pupils equal, round, reactive to light and accommodation V,VII: smile symmetric, left resting  facial droop, facial light touch sensation normal bilaterally VIII: hearing normal bilaterally IX,X: uvula rises symmetrically XI: bilateral shoulder shrug XII: midline tongue extension Motor: Right : Upper extremity   4/5 Left:     Upper extremity   4/5  Lower extremity   5/5  Lower extremity   4/5 Tone and bulk:normal tone throughout; no atrophy noted Sensory:  light touch intact throughout, bilaterally Deep Tendon Reflexes: 2+  Patella, 1+ right ankle jerk, absent ankle jerk on left. 2+ bicep right, hyper reflexic bicep on left side. Plantars: Right: downgoing   Left: downgoing Cerebellar: normal finger-to-nose, and normal heel-to-shin test Gait: normal gait and station   Lab Results: Basic Metabolic Panel: Recent Labs  Lab 04/07/18 1240  NA 138  K 3.5  CL 104  CO2 23  GLUCOSE 206*  BUN 18  CREATININE 1.21*  CALCIUM 9.2    CBC: Recent Labs  Lab 04/07/18 1240  WBC 12.2*  NEUTROABS 6.8  HGB 10.7*  HCT 33.4*  MCV 85.9  PLT 449*   Imaging: Ct Head Wo Contrast  Result Date: 04/07/2018 CLINICAL DATA:  Patient with syncopal episode. EXAM: CT HEAD WITHOUT CONTRAST TECHNIQUE: Contiguous axial images were obtained from the base of the skull through the vertex without intravenous contrast. COMPARISON:  None. FINDINGS: Brain: Ventricles and sulci are prominent compatible with atrophy. Periventricular and subcortical white matter hypodensity compatible with chronic microvascular ischemic changes. 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 No evidence for acute cortically based infarct, intracranial hemorrhage, mass lesion or mass-effect. Vascular: Unremarkable. Skull: Intact. Sinuses/Orbits: Paranasal sinuses are well aerated. Mastoid air cells are unremarkable. Orbits are unremarkable. Other: None. IMPRESSION: No acute intracranial process. Atrophy and chronic microvascular ischemic changes. Electronically Signed   By: Lovey Newcomer M.D.   On: 04/07/2018 14:16   Laurey Morale, MSN, NP-C [facility] [phone]   04/07/2018, 4:52 PM   Attending physician note to follow with Assessment and plan .  ATTENDING ADDENDUM Pt seen and examined. Agree with h&p documented above. I have independently reviewed imaging. CTH with old right BG lacune, no new bleed or evidence of evolving ischemic stroke.   Assessment: 72 y.o. female who has a PMH of DM and HTN brought in after what sounds like a syncopal episode at church while standing. She has had off and on dizziness, facial tingling and left sided weakness. Exam was significant for left facial weakness and left leg drift. Reports feeling unwell in general since flu shot 3-4 weeks ago. Transient left facial droop and persistent left sided weakness is concerning for  stroke/TIA in the right cerebral hemisphere  Also has neck pain and hyper-reflexic left UE, raising concern for left cervical pathology, but to involve the face, has to be a pretty high cervical lesion.  IMPRESSION Evaluate for stroke/TIA Evaluate for high cervical myelopathy/radiculopathy Evaluate for non neurological etiologies of syncope   Recommendations: -MRI brain and MRI C-spine - without contrast -CTA head and neck -Frequent neuro checks -PT/ OT/ SLP --telemonitoring -ECHO -ASA 325 -Lipitor 80 -PT OT ST -A1c -Lipid panel  -Cardiac syncope w/u per primary team  Please page stroke NP/PA/MD (listed on AMION)  from 8am-4 pm as this patient will be followed by  the stroke team at this point.  -- Amie Portland, MD [facility] Pager: [phone] If 7pm to 7am, please call on call as listed on AMION.    -- ADDENDUM 07/12/2018 I have made correction to LKW time on request of Stroke RN from stroke team to accurately reflect that a clear LKW time was not available and best LKW was  3-4 weeks prior to presentation, making her OSW for tPA.   -- Amie Portland, MD [facility] Pager: [phone] If 7pm to 7am, please call on call as listed on AMION.

## 2018-04-07 NOTE — H&P (Signed)
[facility]                                                                                    Patient Demographics  Tara Conner, is a 72 y.o. female  CSN: [id number]  MRN: [id number]  DOB - [birth year]  Admit Date - 04/07/2018  Outpatient Primary MD for the patient is Libby Maw, MD   With History of -  No past medical history on file.    No past surgical history on file.  in for   Chief Complaint  Patient presents with  . Loss of Consciousness     HPI  Tara Conner  is a 72 y.o. female, with past medical history significant for diabetes mellitus with diabetic neuropathy on Neurontin, hypertension and anemia who has been noncompliant with her aspirin presenting today with a syncopal episode at church today.  The patient was standing beside her son when she leaned over and felt very weak then she lost consciousness for a few seconds.  She was lowered down slowly to the floor then she developed nausea and vomiting but no chest pains or palpitations.  Patient denies any headaches dizziness cough, fever or chills.  The patient has been feeling weak for the last 3 weeks after her flu shot and she had some focal weaknesses that lasts for few minutes last week.  Today in the emergency room the patient was noted to have facial droop by the nurse which reverted back to normal.  Patient feels better at this time.  Neurology was contacted and consult is still pending.  CT of the head in the emergency room was negative, awaiting MRI of the brain    Review of Systems    In addition to the HPI above,  No Fever-chills, No Headache,  No problems swallowing food or Liquids, No Chest pain, Cough or Shortness of Breath, No Abdominal pain,  Bowel movements are regular, No Blood in stool or Urine, No dysuria, No new skin rashes or bruises, No new joints pains-aches,  No new weakness, tingling, numbness in any extremity, No recent weight gain or  loss, No polyuria, polydypsia or polyphagia, No significant Mental Stressors.  A full 10 point Review of Systems was done, except as stated above, all other Review of Systems were negative.   Social History Social History   Tobacco Use  . Smoking status: Never Smoker  . Smokeless tobacco: Never Used  Substance Use Topics  . Alcohol use: Never    Frequency: Never     Family History No family history on file.   Prior to Admission medications   Medication Sig Start Date End Date Taking? Authorizing Provider  atorvastatin (LIPITOR) 40 MG tablet Take 1 tablet (40 mg total) by mouth daily. 03/20/18 06/18/18 Yes Libby Maw, MD  Dulaglutide (TRULICITY) 6.57 XU/3.8BF SOPN Inject .3m one weekly sub cutaneously. Patient taking differently: Inject 0.75 mg into the skin every 7 (seven) days.  03/20/18  Yes KLibby Maw MD  gabapentin (NEURONTIN) 300 MG capsule Take one at night for one week and then increase to one twice daily as tolerated. Patient taking differently: Take 300 mg by  mouth at bedtime as needed (for neuropathy).  01/30/18  Yes Libby Maw, MD  Glycerin-Polysorbate 80 (REFRESH DRY EYE THERAPY OP) Place 1-2 drops into both eyes 3 (three) times daily as needed (for dryness).   Yes [provider]  insulin glargine (LANTUS) 100 UNIT/ML injection Inject 0.4 mLs (40 Units total) into the skin at bedtime. Initial dose is 40U at night. Patient will adjust dose up or down as directed. 03/20/18 06/19/18 Yes Libby Maw, MD  lisinopril (PRINIVIL,ZESTRIL) 20 MG tablet Take 1 tablet (20 mg total) by mouth daily. Patient taking differently: Take 20 mg by mouth at bedtime.  03/20/18 06/18/18 Yes Libby Maw, MD  Multiple Vitamins-Minerals (MULTIVITAMIN ADULT PO) Take 1 tablet by mouth daily.   Yes [provider]  Psyllium (METAMUCIL FIBER PO) Take 5-10 g by mouth daily as needed (for constipation).   Yes [provider]  ranitidine (ZANTAC) 150 MG capsule Take 1 capsule (150 mg total) by mouth 2 (two) times daily. Patient taking differently: Take 150 mg by mouth 2 (two) times daily as needed for heartburn.  01/31/18  Yes Libby Maw, MD  aspirin EC 81 MG tablet Take 1 tablet (81 mg total) by mouth daily. Patient not taking: Reported on 04/07/2018 03/20/18 03/20/19  Libby Maw, MD  Blood Glucose Monitoring Suppl (ACCU-CHEK AVIVA PLUS) w/Device KIT Use to test blood sugars 2 times daily, insurance does not cover one touch anymore. 02/02/18   Libby Maw, MD  ferrous sulfate 325 (65 FE) MG tablet Take 1 tablet (325 mg total) by mouth daily with breakfast. Patient taking differently: Take 325-650 mg by mouth daily with breakfast.  02/12/18   Libby Maw, MD  glucose blood (ACCU-CHEK AVIVA PLUS) test strip Use to test blood sugars two times daily. 02/02/18   Libby Maw, MD  Lancets (ACCU-CHEK SOFT [facility]) lancets Use to check blood sugars two times daily. 03/20/18   Libby Maw, MD  NEEDLE, DISP, 30 G (B-D DISP NEEDLE 30GX1") 30G X 1" MISC To use with Lantus. 04/06/18   Libby Maw, MD    Allergies  Allergen Reactions  . Sulfa Antibiotics Other (See Comments)    Pt unable to report reaction    Physical Exam  Vitals  Blood pressure (!) 162/88, pulse 94, temperature 98.1 F (36.7 C), temperature source Oral, resp. rate 14, height 5' 6.5" (1.689 m), weight 90.7 kg, SpO2 98 %.   1. General in no acute distress, well-developed, well-nourished, extremely pleasant  2. Normal affect and insight, Not Suicidal or Homicidal, Awake Alert, Oriented X 3.  3. No F.N deficits, cranial nerves are normal with normal motor power and sensations.  4. Ears and Eyes appear Normal, Conjunctivae clear, PERRLA. Moist Oral Mucosa.  5. Supple Neck, No JVD, No cervical lymphadenopathy appriciated, No Carotid Bruits.  6. Symmetrical Chest wall  movement, Good air movement bilaterally, CTAB.  7. RRR, No Gallops, Rubs or Murmurs, No Parasternal Heave.  8. Positive Bowel Sounds, Abdomen Soft, Non tender, No organomegaly appriciated,No rebound -guarding or rigidity.  9.  No Cyanosis, Normal Skin Turgor, No Skin Rash or Bruise.  10. Good muscle tone,  joints appear normal , no effusions, Normal ROM.    Data Review  CBC Recent Labs  Lab 04/07/18 1240  WBC 12.2*  HGB 10.7*  HCT 33.4*  PLT 449*  MCV 85.9  MCH 27.5  MCHC 32.0  RDW 12.0  LYMPHSABS 4.2*  MONOABS 0.9  EOSABS 0.2  BASOSABS 0.1   ------------------------------------------------------------------------------------------------------------------  Chemistries  Recent Labs  Lab 04/07/18 1240  NA 138  K 3.5  CL 104  CO2 23  GLUCOSE 206*  BUN 18  CREATININE 1.21*  CALCIUM 9.2  AST 21  ALT 17  ALKPHOS 80  BILITOT 0.6   ------------------------------------------------------------------------------------------------------------------ estimated creatinine clearance is 48.9 mL/min (A) (by C-G formula based on SCr of 1.21 mg/dL (H)). ------------------------------------------------------------------------------------------------------------------ No results for input(s): TSH, T4TOTAL, T3FREE, THYROIDAB in the last 72 hours.  Invalid input(s): FREET3   Coagulation profile No results for input(s): INR, PROTIME in the last 168 hours. ------------------------------------------------------------------------------------------------------------------- No results for input(s): DDIMER in the last 72 hours. -------------------------------------------------------------------------------------------------------------------  Cardiac Enzymes No results for input(s): CKMB, TROPONINI, MYOGLOBIN in the last 168 hours.  Invalid input(s): CK ------------------------------------------------------------------------------------------------------------------ Invalid  input(s): POCBNP   ---------------------------------------------------------------------------------------------------------------  Urinalysis    Component Value Date/Time   COLORURINE YELLOW 03/27/2018 1814   APPEARANCEUR HAZY (A) 03/27/2018 1814   LABSPEC 1.017 03/27/2018 1814   PHURINE 5.0 03/27/2018 1814   GLUCOSEU NEGATIVE 03/27/2018 1814   GLUCOSEU NEGATIVE 01/30/2018 1441   HGBUR NEGATIVE 03/27/2018 1814   BILIRUBINUR NEGATIVE 03/27/2018 1814   KETONESUR NEGATIVE 03/27/2018 1814   PROTEINUR NEGATIVE 03/27/2018 1814   UROBILINOGEN 0.2 01/30/2018 1441   NITRITE NEGATIVE 03/27/2018 1814   LEUKOCYTESUR LARGE (A) 03/27/2018 1814    ----------------------------------------------------------------------------------------------------------------   Imaging results:   Ct Head Wo Contrast  Result Date: 04/07/2018 CLINICAL DATA:  Patient with syncopal episode. EXAM: CT HEAD WITHOUT CONTRAST TECHNIQUE: Contiguous axial images were obtained from the base of the skull through the vertex without intravenous contrast. COMPARISON:  None. FINDINGS: Brain: Ventricles and sulci are prominent compatible with atrophy. Periventricular and subcortical white matter hypodensity compatible with chronic microvascular ischemic changes. Old right basal ganglia lacunar infarct. No evidence for acute cortically based infarct, intracranial hemorrhage, mass lesion or mass-effect. Vascular: Unremarkable. Skull: Intact. Sinuses/Orbits: Paranasal sinuses are well aerated. Mastoid air cells are unremarkable. Orbits are unremarkable. Other: None. IMPRESSION: No acute intracranial process. Atrophy and chronic microvascular ischemic changes. Electronically Signed   By: Lovey Newcomer M.D.   On: 04/07/2018 14:16    My personal review of EKG: Sinus tach at 119 bpm    Assessment & Plan  TIA , at church today.  The patient is diabetic and had no breakfast today. multiple episodes of focal weaknesses on the left and  generalized weakness for the last few weeks And episode of facial droop that occurred in our emergency room and then back to normal. CT of the head is negative Neurology consult is pending  Diabetes mellitus ; continue with insulin sliding scale in addition to Trulicity  Diabetic neuropathy Continue with Neurontin  Hypertension continue with lisinopril  Anemia continue with iron sulfate  DVT Prophylaxis Lovenox  AM Labs Ordered, also please review Full Orders    Code Status full  Disposition Plan: Home  Time spent in minutes : 42 minutes  Condition GUARDED   _0 @

## 2018-04-07 NOTE — ED Provider Notes (Signed)
[facility] EMERGENCY DEPARTMENT Provider Note   CSN: [id number] Arrival date & time: 04/07/18  1206     History   Chief Complaint Chief Complaint  Patient presents with  . Loss of Consciousness    HPI Tara Conner is a 72 y.o. female.  The history is provided by the patient and medical records. No language interpreter was used.  Loss of Consciousness   Associated symptoms include weakness. Pertinent negatives include chest pain.   Tara Conner is a 72 y.o. female  with a PMH of HTN, DM, anemia who presents to the Emergency Department for evaluation after syncopal episode just prior to arrival while at church. Patient states that she suddenly felt hot and more weak than usual, then, per son, passed out for about 30 seconds or so. No urinary or bowel incontinence. Not on anticoagulants.  Did not fall or hit her head.  Since the episode, she has been nauseous and has vomited a couple of times as well.  She was given Zofran in route and reports improvement in her nausea.  She states that she got the flu shot 2 or 3 weeks ago and ever since that time, she has been experiencing posterior headaches, myalgias, weakness and just overall not feeling well. Feels as if she has had off-and-on weakness to the left upper and lower extremities over the last two weeks as well. She reports that sometimes while walking, her left leg doesn't feel like it can move as well as the right, but other times, she can ambulate just fine. She was seen in the emergency department with the symptoms began and told that everything looked fine and that it should run its course.  She does not feel like anything has gotten any better. No chest pain or shortness of breath today.   No past medical history on file.  Patient Active Problem List   Diagnosis Date Noted  . TIA (transient ischemic attack) 04/07/2018  . Iron deficiency anemia 02/12/2018  . Urinary tract infection with hematuria  02/12/2018  . Abnormal finding in urine 02/09/2018  . Anemia 02/09/2018  . Essential hypertension 01/30/2018  . Type 2 diabetes mellitus with diabetic neuropathy, with long-term current use of insulin ([place]) 01/30/2018  . Elevated LDL cholesterol level 01/30/2018  . Screen for colon cancer 01/30/2018    No past surgical history on file.   OB History   None      Home Medications    Prior to Admission medications   Medication Sig Start Date End Date Taking? Authorizing Provider  atorvastatin (LIPITOR) 40 MG tablet Take 1 tablet (40 mg total) by mouth daily. 03/20/18 06/18/18 Yes Libby Maw, MD  Dulaglutide (TRULICITY) 6.56 CL/2.7NT SOPN Inject .77m one weekly sub cutaneously. Patient taking differently: Inject 0.75 mg into the skin every 7 (seven) days.  03/20/18  Yes KLibby Maw MD  gabapentin (NEURONTIN) 300 MG capsule Take one at night for one week and then increase to one twice daily as tolerated. Patient taking differently: Take 300 mg by mouth at bedtime as needed (for neuropathy).  01/30/18  Yes KLibby Maw MD  Glycerin-Polysorbate 80 (REFRESH DRY EYE THERAPY OP) Place 1-2 drops into both eyes 3 (three) times daily as needed (for dryness).   Yes [provider]  insulin glargine (LANTUS) 100 UNIT/ML injection Inject 0.4 mLs (40 Units total) into the skin at bedtime. Initial dose is 40U at night. Patient will adjust dose up or down as directed. 03/20/18  06/19/18 Yes Libby Maw, MD  lisinopril (PRINIVIL,ZESTRIL) 20 MG tablet Take 1 tablet (20 mg total) by mouth daily. Patient taking differently: Take 20 mg by mouth at bedtime.  03/20/18 06/18/18 Yes Libby Maw, MD  Multiple Vitamins-Minerals (MULTIVITAMIN ADULT PO) Take 1 tablet by mouth daily.   Yes [provider]  Psyllium (METAMUCIL FIBER PO) Take 5-10 g by mouth daily as needed (for constipation).   Yes [provider]  ranitidine (ZANTAC) 150 MG  capsule Take 1 capsule (150 mg total) by mouth 2 (two) times daily. Patient taking differently: Take 150 mg by mouth 2 (two) times daily as needed for heartburn.  01/31/18  Yes Libby Maw, MD  aspirin EC 81 MG tablet Take 1 tablet (81 mg total) by mouth daily. Patient not taking: Reported on 04/07/2018 03/20/18 03/20/19  Libby Maw, MD  Blood Glucose Monitoring Suppl (ACCU-CHEK AVIVA PLUS) w/Device KIT Use to test blood sugars 2 times daily, insurance does not cover one touch anymore. 02/02/18   Libby Maw, MD  ferrous sulfate 325 (65 FE) MG tablet Take 1 tablet (325 mg total) by mouth daily with breakfast. Patient taking differently: Take 325-650 mg by mouth daily with breakfast.  02/12/18   Libby Maw, MD  glucose blood (ACCU-CHEK AVIVA PLUS) test strip Use to test blood sugars two times daily. 02/02/18   Libby Maw, MD  Lancets (ACCU-CHEK SOFT [facility]) lancets Use to check blood sugars two times daily. 03/20/18   Libby Maw, MD  NEEDLE, DISP, 30 G (B-D DISP NEEDLE 30GX1") 30G X 1" MISC To use with Lantus. 04/06/18   Libby Maw, MD    Family History No family history on file.  Social History Social History   Tobacco Use  . Smoking status: Never Smoker  . Smokeless tobacco: Never Used  Substance Use Topics  . Alcohol use: Never    Frequency: Never  . Drug use: Never     Allergies   Sulfa antibiotics   Review of Systems Review of Systems  Respiratory: Negative for shortness of breath.   Cardiovascular: Positive for syncope. Negative for chest pain.  Neurological: Positive for syncope, weakness and numbness.  All other systems reviewed and are negative.    Physical Exam Updated Vital Signs BP (!) 162/88   Pulse 93   Temp 98.1 F (36.7 C) (Oral)   Resp 15   Ht 5' 6.5" (1.689 m)   Wt 90.7 kg   LMP  (LMP Unknown)   SpO2 96%   BMI 31.80 kg/m   Physical Exam  Constitutional: She is oriented to  person, place, and time. She appears well-developed and well-nourished. No distress.  HENT:  Head: Normocephalic and atraumatic.  Cardiovascular: Normal rate, regular rhythm and normal heart sounds.  No murmur heard. Pulmonary/Chest: Effort normal and breath sounds normal. No respiratory distress.  Abdominal: Soft. She exhibits no distension.  No abdominal tenderness.  Musculoskeletal:  5/5 muscle strength in all four extremities.  Neurological: She is alert and oriented to person, place, and time.  Alert, oriented, thought content appropriate, able to give a coherent history. Speech is clear and goal oriented, able to follow commands.  Cranial Nerves:  II:  Peripheral visual fields grossly normal, pupils equal, round, reactive to light III, IV, VI: EOM intact bilaterally, ptosis not present V,VII: Mild left facial droop, eyes kept closed tightly against resistance, facial light touch sensation equal VIII: hearing grossly normal IX, X: symmetric soft palate  movement, uvula elevates symmetrically  XI: bilateral shoulder shrug symmetric and strong XII: midline tongue extension  Skin: Skin is warm and dry.  Nursing note and vitals reviewed.    ED Treatments / Results  Labs (all labs ordered are listed, but only abnormal results are displayed) Labs Reviewed  CBC WITH DIFFERENTIAL/PLATELET - Abnormal; Notable for the following components:      Result Value   WBC 12.2 (*)    Hemoglobin 10.7 (*)    HCT 33.4 (*)    Platelets 449 (*)    Lymphs Abs 4.2 (*)    All other components within normal limits  COMPREHENSIVE METABOLIC PANEL - Abnormal; Notable for the following components:   Glucose, Bld 206 (*)    Creatinine, Ser 1.21 (*)    GFR calc non Af Amer 44 (*)    GFR calc Af Amer 51 (*)    All other components within normal limits  POC OCCULT BLOOD, ED    EKG EKG Interpretation  Date/Time:  Saturday April 07 2018 12:33:03 EST Ventricular Rate:  119 PR Interval:    QRS  Duration: 84 QT Interval:  342 QTC Calculation: 482 R Axis:   51 Text Interpretation:  Sinus tachycardia Baseline wander in lead(s) V2 SINCE LAST TRACING HEART RATE HAS INCREASED Confirmed by Orlie Dakin [phone]) on 04/07/2018 12:55:35 PM Also confirmed by Orlie Dakin [phone]), editor Philomena Doheny [phone])  on 04/07/2018 2:47:59 PM   Radiology Ct Head Wo Contrast  Result Date: 04/07/2018 CLINICAL DATA:  Patient with syncopal episode. EXAM: CT HEAD WITHOUT CONTRAST TECHNIQUE: Contiguous axial images were obtained from the base of the skull through the vertex without intravenous contrast. COMPARISON:  None. FINDINGS: Brain: Ventricles and sulci are prominent compatible with atrophy. Periventricular and subcortical white matter hypodensity compatible with chronic microvascular ischemic changes. Old right basal ganglia lacunar infarct. No evidence for acute cortically based infarct, intracranial hemorrhage, mass lesion or mass-effect. Vascular: Unremarkable. Skull: Intact. Sinuses/Orbits: Paranasal sinuses are well aerated. Mastoid air cells are unremarkable. Orbits are unremarkable. Other: None. IMPRESSION: No acute intracranial process. Atrophy and chronic microvascular ischemic changes. Electronically Signed   By: Lovey Newcomer M.D.   On: 04/07/2018 14:16    Procedures Procedures (including critical care time)  Medications Ordered in ED Medications  sodium chloride 0.9 % bolus 500 mL (0 mLs Intravenous Stopped 04/07/18 1436)     Initial Impression / Assessment and Plan / ED Course  I have reviewed the triage vital signs and the nursing notes.  Pertinent labs & imaging results that were available during my care of the patient were reviewed by me and considered in my medical decision making (see chart for details).    Tara Conner is a 72 y.o. female who presents to ED for evaluation following syncopal episode. Labs reviewed and reassuring. Baseline anemia. Very mild bump in Cr to  1.21. Given 500cc of fluids and does feel better.   1:19 PM - Dr. Winfred Leeds and I were called to bedside by nursing staff for concerns of new left-sided facial tingling and droop. Patient re-evaluated by both of [place] with mild left facial droop on exam. Denies any arm or leg weakness currently, but has had intermittent left upper and lower extremity weakness over the last 2 weeks. Will continue with plan for CT head and likely will need MRI.   Patient re-evaluated, facial droop resolved. Facial tingling resolved as well. Will need MRI, but given tachycardia, feel that MRI should be postponed until she is  more stable. Neurology was consulted. At shift change, I was still waiting to hear from neurology. Oncoming provider [facility] aware of pending neurology consult.   CT head negative. Spoke with hospitalist who will admit for syncope work up.   Patient seen by and discussed with Dr. Winfred Leeds who agrees with treatment plan.    Final Clinical Impressions(s) / ED Diagnoses   Final diagnoses:  Syncope and collapse    ED Discharge Orders    None       Ward, Ozella Almond, PA-C 04/07/18 1615    Orlie Dakin, MD 04/07/18 1657

## 2018-04-08 ENCOUNTER — Observation Stay (HOSPITAL_BASED_OUTPATIENT_CLINIC_OR_DEPARTMENT_OTHER): Payer: Medicare Other

## 2018-04-08 ENCOUNTER — Encounter (HOSPITAL_COMMUNITY): Payer: Medicare Other

## 2018-04-08 DIAGNOSIS — Z7982 Long term (current) use of aspirin: Secondary | ICD-10-CM | POA: Diagnosis not present

## 2018-04-08 DIAGNOSIS — R29702 NIHSS score 2: Secondary | ICD-10-CM | POA: Diagnosis present

## 2018-04-08 DIAGNOSIS — E1165 Type 2 diabetes mellitus with hyperglycemia: Secondary | ICD-10-CM | POA: Diagnosis present

## 2018-04-08 DIAGNOSIS — Z794 Long term (current) use of insulin: Secondary | ICD-10-CM | POA: Diagnosis not present

## 2018-04-08 DIAGNOSIS — I639 Cerebral infarction, unspecified: Secondary | ICD-10-CM | POA: Diagnosis not present

## 2018-04-08 DIAGNOSIS — Z79899 Other long term (current) drug therapy: Secondary | ICD-10-CM | POA: Diagnosis not present

## 2018-04-08 DIAGNOSIS — N179 Acute kidney failure, unspecified: Secondary | ICD-10-CM | POA: Diagnosis present

## 2018-04-08 DIAGNOSIS — K219 Gastro-esophageal reflux disease without esophagitis: Secondary | ICD-10-CM | POA: Diagnosis present

## 2018-04-08 DIAGNOSIS — Z882 Allergy status to sulfonamides status: Secondary | ICD-10-CM | POA: Diagnosis not present

## 2018-04-08 DIAGNOSIS — I5030 Unspecified diastolic (congestive) heart failure: Secondary | ICD-10-CM

## 2018-04-08 DIAGNOSIS — Z8673 Personal history of transient ischemic attack (TIA), and cerebral infarction without residual deficits: Secondary | ICD-10-CM

## 2018-04-08 DIAGNOSIS — Z9114 Patient's other noncompliance with medication regimen: Secondary | ICD-10-CM | POA: Diagnosis not present

## 2018-04-08 DIAGNOSIS — R29703 NIHSS score 3: Secondary | ICD-10-CM | POA: Diagnosis not present

## 2018-04-08 DIAGNOSIS — I6381 Other cerebral infarction due to occlusion or stenosis of small artery: Secondary | ICD-10-CM | POA: Diagnosis present

## 2018-04-08 DIAGNOSIS — R55 Syncope and collapse: Secondary | ICD-10-CM | POA: Diagnosis present

## 2018-04-08 DIAGNOSIS — H538 Other visual disturbances: Secondary | ICD-10-CM | POA: Diagnosis present

## 2018-04-08 DIAGNOSIS — E1151 Type 2 diabetes mellitus with diabetic peripheral angiopathy without gangrene: Secondary | ICD-10-CM | POA: Diagnosis present

## 2018-04-08 DIAGNOSIS — E785 Hyperlipidemia, unspecified: Secondary | ICD-10-CM | POA: Diagnosis present

## 2018-04-08 DIAGNOSIS — E1140 Type 2 diabetes mellitus with diabetic neuropathy, unspecified: Secondary | ICD-10-CM | POA: Diagnosis present

## 2018-04-08 DIAGNOSIS — I10 Essential (primary) hypertension: Secondary | ICD-10-CM | POA: Diagnosis present

## 2018-04-08 DIAGNOSIS — G459 Transient cerebral ischemic attack, unspecified: Secondary | ICD-10-CM | POA: Diagnosis not present

## 2018-04-08 DIAGNOSIS — R29810 Facial weakness: Secondary | ICD-10-CM | POA: Diagnosis present

## 2018-04-08 LAB — BASIC METABOLIC PANEL
Anion gap: 9 (ref 5–15)
BUN: 21 mg/dL (ref 8–23)
CO2: 23 mmol/L (ref 22–32)
Calcium: 8.7 mg/dL — ABNORMAL LOW (ref 8.9–10.3)
Chloride: 102 mmol/L (ref 98–111)
Creatinine, Ser: 1.36 mg/dL — ABNORMAL HIGH (ref 0.44–1.00)
GFR calc Af Amer: 44 mL/min — ABNORMAL LOW (ref >60)
GFR calc non Af Amer: 38 mL/min — ABNORMAL LOW (ref >60)
Glucose, Bld: 264 mg/dL — ABNORMAL HIGH (ref 70–99)
Potassium: 4.2 mmol/L (ref 3.5–5.1)
Sodium: 134 mmol/L — ABNORMAL LOW (ref 135–145)

## 2018-04-08 LAB — GLUCOSE, CAPILLARY
Glucose-Capillary: 164 mg/dL — ABNORMAL HIGH (ref 70–99)
Glucose-Capillary: 219 mg/dL — ABNORMAL HIGH (ref 70–99)
Glucose-Capillary: 179 mg/dL — ABNORMAL HIGH (ref 70–99)
Glucose-Capillary: 261 mg/dL — ABNORMAL HIGH (ref 70–99)

## 2018-04-08 LAB — LIPID PANEL
Cholesterol: 120 mg/dL (ref 0–200)
HDL: 32 mg/dL — ABNORMAL LOW (ref >40)
LDL Cholesterol: 66 mg/dL (ref 0–99)
Total CHOL/HDL Ratio: 3.8 RATIO
Triglycerides: 109 mg/dL (ref <150)
VLDL: 22 mg/dL (ref 0–40)

## 2018-04-08 LAB — HEMOGLOBIN A1C
Hgb A1c MFr Bld: 8.3 % — ABNORMAL HIGH (ref 4.8–5.6)
Mean Plasma Glucose: 191.51 mg/dL

## 2018-04-08 LAB — CBC
HCT: 29.9 % — ABNORMAL LOW (ref 36.0–46.0)
Hemoglobin: 9.7 g/dL — ABNORMAL LOW (ref 12.0–15.0)
MCH: 28.2 pg (ref 26.0–34.0)
MCHC: 32.4 g/dL (ref 30.0–36.0)
MCV: 86.9 fL (ref 80.0–100.0)
Platelets: 361 10*3/uL (ref 150–400)
RBC: 3.44 MIL/uL — ABNORMAL LOW (ref 3.87–5.11)
RDW: 12.2 % (ref 11.5–15.5)
WBC: 6.7 10*3/uL (ref 4.0–10.5)
nRBC: 0 % (ref 0.0–0.2)

## 2018-04-08 LAB — ECHOCARDIOGRAM COMPLETE
Height: 66.5 in
Weight: 3200 oz

## 2018-04-08 MED ORDER — SODIUM CHLORIDE 0.9 % IV SOLN
INTRAVENOUS | Status: DC
  Administered 2018-04-08 – 2018-04-09 (×2): via INTRAVENOUS

## 2018-04-08 MED ORDER — INSULIN ASPART 100 UNIT/ML ~~LOC~~ SOLN
0.0000 [IU] | Freq: Every day | SUBCUTANEOUS | Status: DC
  Administered 2018-04-08: 3 [IU] via SUBCUTANEOUS

## 2018-04-08 MED ORDER — HYDRALAZINE HCL 20 MG/ML IJ SOLN: 5.0000 mg | Freq: Three times a day (TID) | INTRAMUSCULAR | Status: DC | PRN

## 2018-04-08 MED ORDER — INSULIN ASPART 100 UNIT/ML ~~LOC~~ SOLN
0.0000 [IU] | Freq: Three times a day (TID) | SUBCUTANEOUS | Status: DC
  Administered 2018-04-08 – 2018-04-09 (×2): 3 [IU] via SUBCUTANEOUS
  Administered 2018-04-09: 5 [IU] via SUBCUTANEOUS
  Administered 2018-04-10: 2 [IU] via SUBCUTANEOUS
  Administered 2018-04-10: 5 [IU] via SUBCUTANEOUS

## 2018-04-08 NOTE — Progress Notes (Signed)
Called to evaluate patient for neurological changes.  Neurologist aware and has ordered fluid bolus and to lay patient flat.  On exam patient is awake/alert.  Slight facial droop with some numbness on lower left face.  Tongue midline.  Moves all 4 ext.  No numbness on LE exam.  Patient has had lipitor and ASA today.  MRI/CTA/echo.  Telemetry shows NSR with no atrial fibrillation seen.  Neurology to evaluate patient for further recommendations.    Marlin Canary DO

## 2018-04-08 NOTE — Evaluation (Signed)
Occupational Therapy Evaluation Patient Details Name: Tara Conner MRN: [id number] DOB: February 19, 1946 Today's Date: 04/08/2018    History of Present Illness PATIENT FELL AT [facility] SECONDARY TO SYNCOPY AND WAS BROUGT TO HOSPITAL WITH WORKUP FOR TIA/CVA. PNT PMH: DM, HTN, NUEROPATHY.    Clinical Impression   PATIENT WAS SEEN FOR SKILLED OT EVALUATION AND PATIENT IS S TO MIN GUARD ASSIST FOR ADLS AND ADL TRANSFERS. DISCUSSED WITH PATIENT AND HER NIECE THAT SHE MAY NEED TTB AT HOME TO SAFETY ACCESS HER TUB/SHOWER COMBO. 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 PATIENT WOULD BENEFIT FROM FURTHER OT IN ACUTE SETTING AND HHOT AT D/C. PATIENT ONLY BATHROOM IS UPSTAIRS AND PATIENT WAS SLEEPING DOWNSTAIRS ON COUCH AND WAS USING BSC AT NIGHT. PATIENT WAS THEN EMPTYING THE BUCKET THE NEXT MORNING IN THE UPSTAIRS BATHROOM. PATIENT NIECE WILL STAY TEMPORARILY WITH PATIENT WHO CAN ASSIST.     Follow Up Recommendations  Home health OT    Equipment Recommendations  Tub/shower bench    Recommendations for Other Services       Precautions / Restrictions Precautions Precautions: Fall Restrictions Weight Bearing Restrictions: No      Mobility Bed Mobility Overal bed mobility: Modified Independent                Transfers Overall transfer level: Needs assistance   Transfers: Sit to/from Stand;Stand Pivot Transfers Sit to Stand: Supervision Stand pivot transfers: Min guard       General transfer comment: PNT AMB WITHOUT WALKER WITH THERAPIST HOLDING GAIT BELT WITHOUT LOB    Balance                                           ADL either performed or assessed with clinical judgement   ADL Overall ADL's : Needs assistance/impaired Eating/Feeding: Independent   Grooming: Wash/dry hands;Wash/dry face;Oral care;Supervision/safety;Standing   Upper Body Bathing: Set up;Sitting   Lower Body Bathing: Supervison/ safety;Set up;Sit to/from  stand   Upper Body Dressing : Set up;Sitting   Lower Body Dressing: Supervision/safety;Set up;Sit to/from stand   Toilet Transfer: Min guard;Ambulation;Comfort height toilet;Grab bars   Toileting- Clothing Manipulation and Hygiene: Supervision/safety       Functional mobility during ADLs: Min guard General ADL Comments: PATIENT REPORTS SHE IS FEELING MORE STEADY ON HER FEET COMPAIRED TO YESTERDAY.     Vision Baseline Vision/History: Wears glasses Wears Glasses: At all times Patient Visual Report: No change from baseline Vision Assessment?: No apparent visual deficits     Perception     Praxis      Pertinent Vitals/Pain Pain Assessment: No/denies pain     Hand Dominance Right   Extremity/Trunk Assessment Upper Extremity Assessment Upper Extremity Assessment: Overall WFL for tasks assessed           Communication Communication Communication: No difficulties   Cognition Arousal/Alertness: Awake/alert Behavior During Therapy: WFL for tasks assessed/performed Overall Cognitive Status: Within Functional Limits for tasks assessed                                     General Comments       Exercises     Shoulder Instructions      Home Living Family/patient expects to be discharged to:: Private residence Living Arrangements: Alone Available Help  at Discharge: (PATIENT NEICE WILL STAY WHEN PNT D/C FROM HOSPITAL.) Type of Home: Apartment Home Access: Level entry     Home Layout: Two level Alternate Level Stairs-Number of Steps: (A FLIGHT OF STAIRS)   Bathroom Shower/Tub: Chief Strategy Officer: Standard     Home Equipment: Bedside commode   Additional Comments: PATIENT STATES SHE PLANS ON ORDERING A CANE      Prior Functioning/Environment Level of Independence: Independent                 OT Problem List: Decreased activity tolerance;Impaired balance (sitting and/or standing)      OT Treatment/Interventions:  Self-care/ADL training;DME and/or AE instruction;Therapeutic activities;Patient/family education    OT Goals(Current goals can be found in the care plan section) Acute Rehab OT Goals Patient Stated Goal: GO HOME OT Goal Formulation: With patient/family Time For Goal Achievement: 04/22/18 Potential to Achieve Goals: Good  OT Frequency: Min 2X/week   Barriers to D/C: (PATIENT LIVES IN A TOWN HOUSE AND ONLY BATHROOM IS UPSTAIRS.)          Co-evaluation              AM-PAC PT "6 Clicks" Daily Activity     Outcome Measure Help from another person eating meals?: None Help from another person taking care of personal grooming?: A Little Help from another person toileting, which includes using toliet, bedpan, or urinal?: A Little Help from another person bathing (including washing, rinsing, drying)?: A Little Help from another person to put on and taking off regular upper body clothing?: A Little Help from another person to put on and taking off regular lower body clothing?: A Little 6 Click Score: 19   End of Session Equipment Utilized During Treatment: Gait belt Nurse Communication: (OT TREATMENT)  Activity Tolerance: Patient tolerated treatment well Patient left: in bed;with call bell/phone within reach;with bed alarm set;with family/visitor present  OT Visit Diagnosis: Unsteadiness on feet (R26.81);Dizziness and giddiness (R42)                Time: 1000-1040 OT Time Calculation (min): 40 min Charges:  OT General Charges $OT Visit: 1 Visit OT Evaluation $OT Eval Low Complexity: 1 Low OT Treatments $Self Care/Home Management : 8-22 mins  6 CLICKS  Shelsie Tijerino 04/08/2018, 11:07 AM

## 2018-04-08 NOTE — Evaluation (Signed)
Physical Therapy Evaluation Patient Details Name: Tara Conner MRN: [id number] DOB: [birth year] Today's Date: 04/08/2018   History of Present Illness  Patient brought to ED s/p syncope 04/07/18. She had dizziness, nausea, and vomiting at time of episode. MRI +Rt pontine infarct and extensive chronic ischemia PMH-DM, neuropathy, HTN  Clinical Impression  Pt admitted with above diagnosis. Pt currently with functional limitations due to the deficits listed below (see PT Problem List). Patient lives in 2 story home with only bathroom on 2nd level. She required use of RW to safely ambulate x 65 ft today. She may be able to progress to use of a cane as her primary cause of imbalance appears to be her neuropathy instead of weakness from the CVA. [place] would be easier for her to negotiate up/down stairs to access her bathroom).  Pt will benefit from skilled PT to increase their independence and safety with mobility to allow discharge to the venue listed below.       Follow Up Recommendations Home health PT;Supervision/Assistance - 24 hour    Equipment Recommendations  Rolling walker with 5" wheels    Recommendations for Other Services       Precautions / Restrictions Precautions Precautions: Fall Restrictions Weight Bearing Restrictions: No      Mobility  Bed Mobility Overal bed mobility: Needs Assistance Bed Mobility: Rolling;Supine to Sit Rolling: Modified independent (Device/Increase time)   Supine to sit: Min guard(with rail, nearly LOB posterior)        Transfers Overall transfer level: Needs assistance Equipment used: Rolling walker (2 wheeled);None Transfers: Sit to/from Stand Sit to Stand: Min guard Stand pivot transfers: Min guard       General transfer comment: minguard due to dizziness; vc for safe use of RW  Ambulation/Gait Ambulation/Gait assistance: Min guard Gait Distance (Feet): 65 Feet(15) Assistive device: Rolling walker (2 wheeled);1 person hand  held assist Gait Pattern/deviations: Step-through pattern;Decreased stride length;Decreased weight shift to left;Wide base of support     General Gait Details: better with RW than with single HHA (simulated cane)  Stairs            Wheelchair Mobility    Modified Rankin (Stroke Patients Only) Modified Rankin (Stroke Patients Only) Pre-Morbid Rankin Score: Slight disability Modified Rankin: Moderately severe disability     Balance Overall balance assessment: Needs assistance Sitting-balance support: No upper extremity supported;Feet supported Sitting balance-Leahy Scale: Fair     Standing balance support: Single extremity supported;Bilateral upper extremity supported Standing balance-Leahy Scale: Poor                   Standardized Balance Assessment Standardized Balance Assessment : Berg Balance Test Berg Balance Test Sit to Stand: Able to stand using hands after several tries Standing Unsupported: Unable to stand 30 seconds unassisted Sitting with Back Unsupported but Feet Supported on Floor or Stool: Able to sit safely and securely 2 minutes Stand to Sit: Controls descent by using hands Transfers: Able to transfer with verbal cueing and /or supervision Standing Unsupported with Eyes Closed: Able to stand 10 seconds with supervision Standing Ubsupported with Feet Together: Needs help to attain position and unable to hold for 15 seconds Turn 360 Degrees: Needs assistance while turning Standing Unsupported, One Foot in Front: Able to take small step independently and hold 30 seconds         Pertinent Vitals/Pain Pain Assessment: No/denies pain    Home Living Family/patient expects to be discharged to:: Private residence Living Arrangements: Alone Available Help at Discharge:  Family;Available 24 hours/day(niece) Type of Home: Apartment Home Access: Level entry     Home Layout: Two level Home Equipment: Bedside commode Additional Comments: Pt had been  considering getting a cane due to incr imbalance last several months;     Prior Function Level of Independence: Independent         Comments: doesn't drive (son takes her to grocery store); holds onto surfaces when walking      Hand Dominance   Dominant Hand: Right    Extremity/Trunk Assessment   Upper Extremity Assessment Upper Extremity Assessment: Defer to OT evaluation    Lower Extremity Assessment Lower Extremity Assessment: RLE deficits/detail;LLE deficits/detail RLE Deficits / Details: knee extension 4/5, knee flexion 3+ RLE Sensation: history of peripheral neuropathy LLE Deficits / Details: Knee extension 3+, knee flexion 3 LLE Sensation: history of peripheral neuropathy    Cervical / Trunk Assessment Cervical / Trunk Assessment: Kyphotic  Communication   Communication: No difficulties  Cognition Arousal/Alertness: Awake/alert Behavior During Therapy: WFL for tasks assessed/performed Overall Cognitive Status: Within Functional Limits for tasks assessed                                        General Comments General comments (skin integrity, edema, etc.): During Berg (after ambulation) pt began reporting LLE felt "wierd...sort of numb" and asked if her mouth was crookd (+left facial droop at rest, not present when smiling); RN called and in to reassess pt and facial droop had subsided, but pt continued to report LLE feels differnet    Exercises     Assessment/Plan    PT Assessment Patient needs continued PT services  PT Problem List Decreased strength;Decreased activity tolerance;Decreased balance;Decreased mobility;Decreased knowledge of use of DME;Impaired sensation       PT Treatment Interventions DME instruction;Gait training;Stair training;Functional mobility training;Therapeutic activities;Therapeutic exercise;Balance training;Neuromuscular re-education;Patient/family education    PT Goals (Current goals can be found in the Care Plan  section)  Acute Rehab PT Goals Patient Stated Goal: improve strength and go home PT Goal Formulation: With patient Time For Goal Achievement: 04/15/18 Potential to Achieve Goals: Good    Frequency Min 4X/week   Barriers to discharge        Co-evaluation               AM-PAC PT "6 Clicks" Daily Activity  Outcome Measure Difficulty turning over in bed (including adjusting bedclothes, sheets and blankets)?: A Little Difficulty moving from lying on back to sitting on the side of the bed? : A Little Difficulty sitting down on and standing up from a chair with arms (e.g., wheelchair, bedside commode, etc,.)?: A Little Help needed moving to and from a bed to chair (including a wheelchair)?: A Little Help needed walking in hospital room?: A Little Help needed climbing 3-5 steps with a railing? : A Lot 6 Click Score: 17    End of Session Equipment Utilized During Treatment: Gait belt Activity Tolerance: Treatment limited secondary to medical complications (Comment)(incr left sided numbness/weakness) Patient left: with nursing/sitter in room Nurse Communication: Mobility status;Other (comment)(observed facial droop) PT Visit Diagnosis: Other abnormalities of gait and mobility (R26.89);Other symptoms and signs involving the nervous system (R29.898)    Time: 1259-1330 PT Time Calculation (min) (ACUTE ONLY): 31 min   Charges:   PT Evaluation $PT Eval High Complexity: 1 High            Larita Fife  P Nekoda Chock, PT 04/08/2018, 1:47 PM

## 2018-04-08 NOTE — Progress Notes (Signed)
  Echocardiogram 2D Echocardiogram has been performed.  Celene Skeen 04/08/2018, 12:51 PM

## 2018-04-08 NOTE — Progress Notes (Signed)
STROKE TEAM PROGRESS NOTE   HISTORY OF PRESENT ILLNESS (per record) Tara Conner is an 72 y.o. female  With PMH significant for DM 2, HTN presented to the ED  With complaints of syncope, n/v, dizziness, generalized weakness and transient left facial weakness, facial numbness.  Per patient she has not been feeling herself for the past 3-4 weeks since she received a high dose flu shot. Since then she has had some some recurrent HA (relieved with tylenol and advil), some dizziness (described as room spinning and sometimes lightheaded), and diarrhea that have completely resolved in between episodes.  Today while she was at church and standing she had a sudden onset of whole body weakness. So she asked to lean over on her son. She apparently then lost consciousness for about 30 seconds before coming around. Her son and others helped lower her to floor. She did not fall nor hit her head. She then regained consciousness and then had nausea and vomiting. This has never happened before and she has never had a stroke before. Denies any HA, CP, SOB, blurred vision during this episode.  Over the past 3 -4 weeks she has had intermittent episodes of dizziness/ lightheaded,  Blurry vision. She also states that her HA will begin in the nape of her neck and radiate up her neck to about the middle of her neck. This comes and goes and she is completely normal in between episodes.  In the ED she had an episode of left facial numbness, and some left facial droop witnessed by ER RN - lasted 20 min.  Admitted for stroke/TIA work up.  Denies taking ASA daily.  ED course:  BP:161/100 BG: 206 CTH: no hemorrhage  Date last known well: Date: 04/07/2018 Time last known well: Time: 11:30 tPA Given: No: outside of window Modified Rankin: Rankin Score=0 NIHSS:2; facial droop and left leg drift   SUBJECTIVE (INTERVAL HISTORY) She felt like she was biting her cheek earlier on the left.    MRI shows acute right  pontine ischemic infarct.  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  She has evidence of microhemorrhage in the left parietal area in the past as well.  She has periventricular small vessel ischemic disease.  MRA and CTA Brain and Neck were negative.  TTE is normal.  Tele is NSR so far.  LDL 66.  A1c 8.3.  BP 163/98.  She was not on antiplatelet therapy at home prior to admission.       OBJECTIVE Vitals:   04/08/18 0737 04/08/18 1202 04/08/18 1448 04/08/18 1450  BP: (!) 152/71 (!) 158/74 (!) 174/83 (!) 163/98  Pulse: 86 83 91 91  Resp: 18 18 16 16   Temp: (!) 97.4 F (36.3 C) 97.6 F (36.4 C) 97.7 F (36.5 C) 97.7 F (36.5 C)  TempSrc: Oral Oral Oral Oral  SpO2: 100% 97% 100% 100%  Weight:      Height:        CBC:  Recent Labs  Lab 04/07/18 1240 04/08/18 1028  WBC 12.2* 6.7  NEUTROABS 6.8  --   HGB 10.7* 9.7*  HCT 33.4* 29.9*  MCV 85.9 86.9  PLT 449* 361    Basic Metabolic Panel:  Recent Labs  Lab 04/07/18 1240 04/08/18 1028  NA 138 134*  K 3.5 4.2  CL 104 102  CO2 23 23  GLUCOSE 206* 264*  BUN 18 21  CREATININE 1.21* 1.36*  CALCIUM 9.2 8.7*    Lipid Panel:  Component Value Date/Time   CHOL 120 04/08/2018 0523   TRIG 109 04/08/2018 0523   HDL 32 (L) 04/08/2018 0523   CHOLHDL 3.8 04/08/2018 0523   VLDL 22 04/08/2018 0523   LDLCALC 66 04/08/2018 0523   HgbA1c:  Lab Results  Component Value Date   HGBA1C 8.3 (H) 04/08/2018   Urine Drug Screen: No results found for: LABOPIA, COCAINSCRNUR, LABBENZ, AMPHETMU, THCU, LABBARB  Alcohol Level No results found for: ETH  IMAGING  Ct Angio Head W Or Wo Contrast Ct Angio Neck W Or Wo Contrast 04/07/2018 IMPRESSION:  Nonstenotic extracranial atheromatous change, without flow reducing carotid or vertebral stenosis. Mild irregularity of the mid to distal basilar, without flow reducing stenosis. No abnormal postcontrast enhancement.    Dg Chest 2 View 04/07/2018  IMPRESSION:  No active  cardiopulmonary disease.    Ct Head Wo Contrast 04/07/2018 IMPRESSION:  No acute intracranial process. Atrophy and chronic microvascular ischemic changes.    Mr Cervical Spine Wo Contrast 04/07/2018 IMPRESSION:  1. Shallow central disc protrusions at C3-4, C5-6 and C7-T1 without significant neural compression.  2. No findings for foraminal stenosis.    Mr Maxine Glenn Head Wo Contrast  04/07/2018 IMPRESSION:  Acute to subacute RIGHT mid pontine infarct. Correlate clinically as a cause for LEFT leg and LEFT facial weakness. Extensive chronic ischemia, throughout the cerebral hemispheres and brainstem, some foci of which show chronic hemorrhage. Atrophy and small vessel disease. No proximal intracranial flow reducing lesion. Mild to moderate basilar irregularity without flow-limiting stenosis.     Transthoracic Echocardiogram  04/08/2018 Study Conclusions - Left ventricle: The cavity size was normal. Wall thickness was   increased in a pattern of mild LVH. Systolic function was normal.   The estimated ejection fraction was in the range of 60% to 65%.   Wall motion was normal; there were no regional wall motion   abnormalities. Doppler parameters are consistent with abnormal   left ventricular relaxation (grade 1 diastolic dysfunction).   Doppler parameters are consistent with high ventricular filling   pressure. - Aortic valve: Trileaflet; mildly thickened leaflets. - Mitral valve: Moderately calcified annulus. - Systemic veins: IVC poorly visualized but appears dilated.    PHYSICAL EXAM Blood pressure (!) 163/98, pulse 91, temperature 97.7 F (36.5 C), temperature source Oral, resp. rate 16, height 5' 6.5" (1.689 m), weight 90.7 kg, SpO2 100 %. Awake, alert, fully oriented. Language - fluent, C/N/R - intact. Very mild left lower facial weakness. PERL,EOMI.   Strength left pronator drift only.  LLE 4/5 weakness. Left babinski. Coord- intact.       ASSESSMENT/PLAN [name] is a 72 y.o. female with history of DM and Htn presenting with syncope, n/v, dizziness, generalized weakness and transient left facial weakness, facial numbness. She did not receive IV t-PA due to late presentation.  Stroke:  RIGHT mid pontine infarct - small vessel disease   Resultant  Left side weakness.  CT head - No acute intracranial process  MRI head - Acute to subacute RIGHT mid pontine infarct.   MRA head - Extensive chronic ischemia, throughout the cerebral hemispheres and brainstem, some foci of which show chronic hemorrhage  CTA H&N - Nonstenotic extracranial atheromatous change, without flow reducing carotid or vertebral stenosis  Carotid Doppler - CTA neck performed - carotid dopplers not indicated.  2D Echo - EF 60 - 65%. No cardiac source of emboli identified. IVC poorly visualized but appears dilated.  LDL - 66  HgbA1c - 8.3  VTE prophylaxis -  Lovenox  Diet  - Carb modified with thin liquids  No antithrombotic prior to admission, now on aspirin 325 mg daily  Patient counseled to be compliant with her antithrombotic medications  Ongoing aggressive stroke risk factor management  Therapy recommendations:  pending  Disposition:  Pending  Hypertension  Stable . Permissive hypertension (OK if < 220/120) but gradually normalize in 5-7 days . Long-term BP goal normotensive  Hyperlipidemia  Lipid lowering medication PTA:  Lipitor 40 mg daily  LDL 66, goal < 70  Current lipid lowering medication: Lipitor 40 mg daily  Continue statin at discharge  Diabetes  HgbA1c 8.3 , goal < 7.0  Uncontrolled  Other Stroke Risk Factors  Advanced age  Obesity, Body mass index is 31.8 kg/m., recommend weight loss, diet and exercise as appropriate    Previous strokes    Other Active Problems  Anemia - 9.7 / 29.9  Creatinine - 1.36    Plan  Recurrent deficits -> bedrest - 250cc NS bolus   Hospital day # 0  Acute right pontine ischemic  infarct with mostly LLE residual weakness.  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  All large intracranial and extracranial arteries are patent.  She has been started on ASA for secondary stroke prevention.  I recommend permissive hypertension for SBP to 220 or DBP to 120 in the first 24-48 hours, and then slowly normalize BP before discharge.  Her LDL is well controlled and continue Lipitor.  Her DM is poorly and needs to be better managed.  She is not a smoker.  Will likely require rehab for left leg weakness and gait.  Weston Settle, MS, MD  To contact Stroke Continuity provider, please refer to [email or web address]. After hours, contact General Neurology

## 2018-04-08 NOTE — Progress Notes (Signed)
Progress Note    Tara Conner  BJY:[id number] DOB: [birth year]  DOA: 04/07/2018 PCP: Mliss Sax, MD    Brief Narrative:    Medical records reviewed and are as summarized below:  Tara Conner is an 72 y.o. female with past medical history significant for diabetes mellitus with diabetic neuropathy on Neurontin, hypertension and anemia who has been noncompliant with her aspirin presenting today with a syncopal episode at church today.  The patient was standing beside her son when she leaned over and felt very weak then she lost consciousness for a few seconds.  Assessment/Plan:   Active Problems:   Essential hypertension   Type 2 diabetes mellitus with diabetic neuropathy, with long-term current use of insulin (HCC)   CVA (cerebral vascular accident) (HCC)  CVA MRI positive -echo: pending LDL: <70 HgbA1c: >7 needs tighter control -neurology consult -s/p CTA  DM -lantus/SSI -needs tighter control  HTN -will allow for permissive HTN -resume home meds as able  AKI -hold lisinopril for now -did get contrasted study-- recheck in AM Gentle IVF  obesity Body mass index is 31.8 kg/m.   Family Communication/Anticipated D/C date and plan/Code Status   DVT prophylaxis: Lovenox ordered. Code Status: Full Code.  Family Communication: at bedside Disposition Plan: home once work up complete   Medical Consultants:    Neurology    Subjective:   BP had been running high at home  Objective:    Vitals:   04/07/18 2328 04/08/18 0332 04/08/18 0737 04/08/18 1202  BP: (!) 161/76 (!) 167/86 (!) 152/71 (!) 158/74  Pulse: 89 85 86 83  Resp: 18 18 18 18   Temp: 98.1 F (36.7 C) 98.2 F (36.8 C) (!) 97.4 F (36.3 C) 97.6 F (36.4 C)  TempSrc: Oral Oral Oral Oral  SpO2: 100% 100% 100% 97%  Weight:      Height:       No intake or output data in the 24 hours ending 04/08/18 1248 Filed Weights   04/07/18 1220  Weight: 90.7 kg     Exam: A+Ox3, NAD Moves all 4 ext +BS, soft No increased work of breathing Follow commands pleasant  Data Reviewed:   I have personally reviewed following labs and imaging studies:  Labs: Labs show the following:   Basic Metabolic Panel: Recent Labs  Lab 04/07/18 1240 04/08/18 1028  NA 138 134*  K 3.5 4.2  CL 104 102  CO2 23 23  GLUCOSE 206* 264*  BUN 18 21  CREATININE 1.21* 1.36*  CALCIUM 9.2 8.7*   GFR Estimated Creatinine Clearance: 43.5 mL/min (A) (by C-G formula based on SCr of 1.36 mg/dL (H)). Liver Function Tests: Recent Labs  Lab 04/07/18 1240  AST 21  ALT 17  ALKPHOS 80  BILITOT 0.6  PROT 8.1  ALBUMIN 3.5   No results for input(s): LIPASE, AMYLASE in the last 168 hours. No results for input(s): AMMONIA in the last 168 hours. Coagulation profile No results for input(s): INR, PROTIME in the last 168 hours.  CBC: Recent Labs  Lab 04/07/18 1240 04/08/18 1028  WBC 12.2* 6.7  NEUTROABS 6.8  --   HGB 10.7* 9.7*  HCT 33.4* 29.9*  MCV 85.9 86.9  PLT 449* 361   Cardiac Enzymes: No results for input(s): CKTOTAL, CKMB, CKMBINDEX, TROPONINI in the last 168 hours. BNP (last 3 results) No results for input(s): PROBNP in the last 8760 hours. CBG: Recent Labs  Lab 04/07/18 2228 04/08/18 0615 04/08/18 1137  GLUCAP 134* 164* 219*  D-Dimer: No results for input(s): DDIMER in the last 72 hours. Hgb A1c: Recent Labs    04/08/18 0523  HGBA1C 8.3*   Lipid Profile: Recent Labs    04/08/18 0523  CHOL 120  HDL 32*  LDLCALC 66  TRIG 161  CHOLHDL 3.8   Thyroid function studies: No results for input(s): TSH, T4TOTAL, T3FREE, THYROIDAB in the last 72 hours.  Invalid input(s): FREET3 Anemia work up: No results for input(s): VITAMINB12, FOLATE, FERRITIN, TIBC, IRON, RETICCTPCT in the last 72 hours. Sepsis Labs: Recent Labs  Lab 04/07/18 1240 04/08/18 1028  WBC 12.2* 6.7    Microbiology No results found for this or any previous  visit (from the past 240 hour(s)).  Procedures and diagnostic studies:  Ct Angio Head W Or Wo Contrast  Result Date: 04/07/2018 CLINICAL DATA:  Syncopal episode at church earlier today. LEFT-sided weakness. EXAM: CT ANGIOGRAPHY HEAD AND NECK TECHNIQUE: Multidetector CT imaging of the head and neck was performed using the standard protocol during bolus administration of intravenous contrast. Multiplanar CT image reconstructions and MIPs were obtained to evaluate the vascular anatomy. Carotid stenosis measurements (when applicable) are obtained utilizing NASCET criteria, using the distal internal carotid diameter as the denominator. CONTRAST:  ISOVUE-370 IOPAMIDOL (ISOVUE-370) INJECTION 76% COMPARISON:  CT head earlier today. FINDINGS: CTA NECK FINDINGS Aortic arch: Standard branching. Imaged portion shows no evidence of aneurysm or dissection. No significant stenosis of the major arch vessel origins. Moderate calcific atheromatous change of the proximal RIGHT subclavian. Right carotid system: No evidence of dissection, stenosis (50% or greater) or occlusion. Mild to moderate calcific plaque extends into the RIGHT ICA, non flow reducing. Left carotid system: No evidence of dissection, stenosis (50% or greater) or occlusion. Mild to moderate calcific plaque extends into the LEFT ICA, non flow reducing. Vertebral arteries: Dominant LEFT vertebral.  Both are patent. Skeleton: No worrisome osseous lesion. Other neck: Mild thyromegaly.  No adenopathy or mass. Upper chest: Lung apices are clear. Review of the MIP images confirms the above findings CTA HEAD FINDINGS Anterior circulation: Nonstenotic atheromatous calcification of the cavernous carotid arteries bilaterally. ICA termini widely patent. No ACA or MCA stenosis or occlusion. Posterior circulation: LEFT vertebral is the dominant/sole contributor to the basilar. 50% stenosis, LEFT V4 segment. RIGHT vertebral ends in PICA. There is nonstenotic irregularity  of the distal basilar, see for instance 8:109, 9:93. Fetal origin RIGHT PCA. LEFT P2 PCA stenosis in its ambient segment, 50-75%. Venous sinuses: As permitted by contrast timing, patent. Anatomic variants: Fetal RIGHT PCA. Delayed phase: No abnormal postcontrast enhancement. Review of the MIP images confirms the above findings IMPRESSION: Nonstenotic extracranial atheromatous change, without flow reducing carotid or vertebral stenosis. Mild irregularity of the mid to distal basilar, without flow reducing stenosis. No abnormal postcontrast enhancement. Electronically Signed   By: Elsie Stain M.D.   On: 04/07/2018 20:17   Dg Chest 2 View  Result Date: 04/07/2018 CLINICAL DATA:  TIA, syncope. EXAM: CHEST - 2 VIEW COMPARISON:  None. FINDINGS: Heart size is upper normal. Lungs are clear. No pleural effusion or pneumothorax seen. No acute or suspicious osseous finding. IMPRESSION: No active cardiopulmonary disease. Electronically Signed   By: Bary Richard M.D.   On: 04/07/2018 19:08   Ct Head Wo Contrast  Result Date: 04/07/2018 CLINICAL DATA:  Patient with syncopal episode. EXAM: CT HEAD WITHOUT CONTRAST TECHNIQUE: Contiguous axial images were obtained from the base of the skull through the vertex without intravenous contrast. COMPARISON:  None. FINDINGS: Brain: Ventricles  and sulci are prominent compatible with atrophy. Periventricular and subcortical white matter hypodensity compatible with chronic microvascular ischemic changes. Old right basal ganglia lacunar infarct. No evidence for acute cortically based infarct, intracranial hemorrhage, mass lesion or mass-effect. Vascular: Unremarkable. Skull: Intact. Sinuses/Orbits: Paranasal sinuses are well aerated. Mastoid air cells are unremarkable. Orbits are unremarkable. Other: None. IMPRESSION: No acute intracranial process. Atrophy and chronic microvascular ischemic changes. Electronically Signed   By: Annia Belt M.D.   On: 04/07/2018 14:16   Ct Angio Neck  W Or Wo Contrast  Result Date: 04/07/2018 CLINICAL DATA:  Syncopal episode at church earlier today. LEFT-sided weakness. EXAM: CT ANGIOGRAPHY HEAD AND NECK TECHNIQUE: Multidetector CT imaging of the head and neck was performed using the standard protocol during bolus administration of intravenous contrast. Multiplanar CT image reconstructions and MIPs were obtained to evaluate the vascular anatomy. Carotid stenosis measurements (when applicable) are obtained utilizing NASCET criteria, using the distal internal carotid diameter as the denominator. CONTRAST:  ISOVUE-370 IOPAMIDOL (ISOVUE-370) INJECTION 76% COMPARISON:  CT head earlier today. FINDINGS: CTA NECK FINDINGS Aortic arch: Standard branching. Imaged portion shows no evidence of aneurysm or dissection. No significant stenosis of the major arch vessel origins. Moderate calcific atheromatous change of the proximal RIGHT subclavian. Right carotid system: No evidence of dissection, stenosis (50% or greater) or occlusion. Mild to moderate calcific plaque extends into the RIGHT ICA, non flow reducing. Left carotid system: No evidence of dissection, stenosis (50% or greater) or occlusion. Mild to moderate calcific plaque extends into the LEFT ICA, non flow reducing. Vertebral arteries: Dominant LEFT vertebral.  Both are patent. Skeleton: No worrisome osseous lesion. Other neck: Mild thyromegaly.  No adenopathy or mass. Upper chest: Lung apices are clear. Review of the MIP images confirms the above findings CTA HEAD FINDINGS Anterior circulation: Nonstenotic atheromatous calcification of the cavernous carotid arteries bilaterally. ICA termini widely patent. No ACA or MCA stenosis or occlusion. Posterior circulation: LEFT vertebral is the dominant/sole contributor to the basilar. 50% stenosis, LEFT V4 segment. RIGHT vertebral ends in PICA. There is nonstenotic irregularity of the distal basilar, see for instance 8:109, 9:93. Fetal origin RIGHT PCA. LEFT P2 PCA  stenosis in its ambient segment, 50-75%. Venous sinuses: As permitted by contrast timing, patent. Anatomic variants: Fetal RIGHT PCA. Delayed phase: No abnormal postcontrast enhancement. Review of the MIP images confirms the above findings IMPRESSION: Nonstenotic extracranial atheromatous change, without flow reducing carotid or vertebral stenosis. Mild irregularity of the mid to distal basilar, without flow reducing stenosis. No abnormal postcontrast enhancement. Electronically Signed   By: Elsie Stain M.D.   On: 04/07/2018 20:17   Mr Brain Wo Contrast  Result Date: 04/07/2018 CLINICAL DATA:  Malaise for 3-4 weeks after receiving a high-dose flu shot. Recurring headache and dizziness. Syncopal episode today, now with LEFT-sided weakness. EXAM: MRI HEAD WITHOUT CONTRAST MRA HEAD WITHOUT CONTRAST TECHNIQUE: Multiplanar, multiecho pulse sequences of the brain and surrounding structures were obtained without intravenous contrast. Angiographic images of the head were obtained using MRA technique without contrast. COMPARISON:  CTA head neck reported separately. FINDINGS: MRI HEAD FINDINGS Brain: In the RIGHT paramedian pons, there is a heterogeneous 6 x 11 mm region of restricted and facilitated diffusion, more restricted medially, low to normal ADC, and correlating with abnormal T2 and FLAIR hyperintensity, but not frank lacunar change, consistent with an acute/subacute brainstem infarct. Immediately adjacent, and slightly more cephalad on the LEFT, there is a large chronic LEFT paramedian pons lacunar infarct. Generalized atrophy. T2 and  FLAIR hyperintensities throughout the white matter, moderately advanced, consistent with small vessel disease. In addition to the brainstem ischemic change, BILATERAL basal ganglia and deep white matter lacunar infarcts are observed. Punctate foci of chronic hemorrhage affects the LEFT parietal cortex and subcortical white matter, which could represent a remote ischemic or  traumatic insult. The LEFT upper pontine lacune is hemorrhagic as well. Vascular: Reported separately. Skull and upper cervical spine: Normal marrow signal. No upper cervical lesions. Sinuses/Orbits: Chronic maxillary sinus disease. No layering fluid. Negative orbits. Other: None. MRA HEAD FINDINGS The internal carotid arteries are widely patent. The basilar artery is supplied solely by the LEFT vertebral, which is mildly irregular in its V4 segment. The basilar demonstrates mild to moderate irregularity without flow-limiting stenosis; this can be seen in its mid to distal aspects. Fetal origin RIGHT PCA. 50-75% stenosis LEFT P2 PCA ambient segment. Moderate to severely diseased RIGHT PCA P3 segment and beyond. Mild irregularity of the M2 and M3 MCA segments consistent with intracranial atherosclerotic disease. IMPRESSION: Acute to subacute RIGHT mid pontine infarct. Correlate clinically as a cause for LEFT leg and LEFT facial weakness. Extensive chronic ischemia, throughout the cerebral hemispheres and brainstem, some foci of which show chronic hemorrhage. Atrophy and small vessel disease. No proximal intracranial flow reducing lesion. Mild to moderate basilar irregularity without flow-limiting stenosis. Electronically Signed   By: Elsie Stain M.D.   On: 04/07/2018 20:56   Mr Cervical Spine Wo Contrast  Result Date: 04/07/2018 CLINICAL DATA:  Three-week history syncope, nausea, vomiting and headaches and neck pain. EXAM: MRI CERVICAL SPINE WITHOUT CONTRAST TECHNIQUE: Multiplanar, multisequence MR imaging of the cervical spine was performed. No intravenous contrast was administered. COMPARISON:  Neck CT 04/07/2018 FINDINGS: Alignment: Normal Vertebrae: Endplate reactive changes but no bone lesions or fractures. Cord: Moderate artifact through the cord but no definite cord lesions or syrinx. Posterior Fossa, vertebral arteries, paraspinal tissues: Evidence of white matter disease and or prior brainstem  infarcts. Moderate cerebellar atrophy. Moderate degenerative changes at C1-2 but no significant pannus formation or mass effect on the upper cervical cord. Disc levels: C2-3: No significant findings. C3-4: Very shallow central disc protrusion with minimal impression on the ventral thecal sac. No spinal or foraminal stenosis. C4-5: No significant findings. C5-6: Moderate disc disease and facet disease. Shallow central disc protrusion with focal mass effect on the ventral thecal sac and slight narrowing the ventral CSF space. No foraminal stenosis. C6-7: No significant findings. C7-T1: Shallow central disc protrusion with mild focal impression on the ventral thecal sac. No foraminal stenosis. IMPRESSION: 1. Shallow central disc protrusions at C3-4, C5-6 and C7-T1 without significant neural compression. 2. No findings for foraminal stenosis. Electronically Signed   By: Rudie Meyer M.D.   On: 04/07/2018 20:58   Mr Maxine Glenn Head Wo Contrast  Result Date: 04/07/2018 CLINICAL DATA:  Malaise for 3-4 weeks after receiving a high-dose flu shot. Recurring headache and dizziness. Syncopal episode today, now with LEFT-sided weakness. EXAM: MRI HEAD WITHOUT CONTRAST MRA HEAD WITHOUT CONTRAST TECHNIQUE: Multiplanar, multiecho pulse sequences of the brain and surrounding structures were obtained without intravenous contrast. Angiographic images of the head were obtained using MRA technique without contrast. COMPARISON:  CTA head neck reported separately. FINDINGS: MRI HEAD FINDINGS Brain: In the RIGHT paramedian pons, there is a heterogeneous 6 x 11 mm region of restricted and facilitated diffusion, more restricted medially, low to normal ADC, and correlating with abnormal T2 and FLAIR hyperintensity, but not frank lacunar change, consistent with an acute/subacute  brainstem infarct. Immediately adjacent, and slightly more cephalad on the LEFT, there is a large chronic LEFT paramedian pons lacunar infarct. Generalized atrophy. T2  and FLAIR hyperintensities throughout the white matter, moderately advanced, consistent with small vessel disease. In addition to the brainstem ischemic change, BILATERAL basal ganglia and deep white matter lacunar infarcts are observed. Punctate foci of chronic hemorrhage affects the LEFT parietal cortex and subcortical white matter, which could represent a remote ischemic or traumatic insult. The LEFT upper pontine lacune is hemorrhagic as well. Vascular: Reported separately. Skull and upper cervical spine: Normal marrow signal. No upper cervical lesions. Sinuses/Orbits: Chronic maxillary sinus disease. No layering fluid. Negative orbits. Other: None. MRA HEAD FINDINGS The internal carotid arteries are widely patent. The basilar artery is supplied solely by the LEFT vertebral, which is mildly irregular in its V4 segment. The basilar demonstrates mild to moderate irregularity without flow-limiting stenosis; this can be seen in its mid to distal aspects. Fetal origin RIGHT PCA. 50-75% stenosis LEFT P2 PCA ambient segment. Moderate to severely diseased RIGHT PCA P3 segment and beyond. Mild irregularity of the M2 and M3 MCA segments consistent with intracranial atherosclerotic disease. IMPRESSION: Acute to subacute RIGHT mid pontine infarct. Correlate clinically as a cause for LEFT leg and LEFT facial weakness. Extensive chronic ischemia, throughout the cerebral hemispheres and brainstem, some foci of which show chronic hemorrhage. Atrophy and small vessel disease. No proximal intracranial flow reducing lesion. Mild to moderate basilar irregularity without flow-limiting stenosis. Electronically Signed   By: Elsie Stain M.D.   On: 04/07/2018 20:56    Medications:   . aspirin  300 mg Rectal Daily   Or  . aspirin  325 mg Oral Daily  . atorvastatin  40 mg Oral Daily  . enoxaparin (LOVENOX) injection  40 mg Subcutaneous Q24H  . famotidine  20 mg Oral Daily  . insulin aspart  0-5 Units Subcutaneous QHS  .  insulin aspart  0-9 Units Subcutaneous TID WC  . insulin glargine  25 Units Subcutaneous QHS  . lisinopril  20 mg Oral Daily  . multivitamin with minerals   Oral Daily   Continuous Infusions:   LOS: 0 days   Joseph Art  [facility]   *Please refer to [email or web address], password [email or web address] to get updated schedule on who will round on this patient, as hospitalists switch teams weekly. If 7PM-7AM, please contact night-coverage at [email or web address], password [email or web address] for any overnight needs.  04/08/2018, 12:48 PM

## 2018-04-08 NOTE — Evaluation (Signed)
Speech Language Pathology Evaluation Patient Details Name: Tara Conner MRN: [id number] DOB: 12-31-1945 Today's Date: 04/08/2018 Time: 4098-1191 SLP Time Calculation (min) (ACUTE ONLY): 18 min  Problem List:  Patient Active Problem List   Diagnosis Date Noted  . CVA (cerebral vascular accident) (HCC) 04/08/2018  . TIA (transient ischemic attack) 04/07/2018  . Iron deficiency anemia 02/12/2018  . Urinary tract infection with hematuria 02/12/2018  . Abnormal finding in urine 02/09/2018  . Anemia 02/09/2018  . Essential hypertension 01/30/2018  . Type 2 diabetes mellitus with diabetic neuropathy, with long-term current use of insulin (HCC) 01/30/2018  . Elevated LDL cholesterol level 01/30/2018  . Screen for colon cancer 01/30/2018   Past Medical History: No past medical history on file. Past Surgical History: No past surgical history on file. HPI:  Patient brought to ED s/p syncope 04/07/18. She had dizziness, nausea, and vomiting at time of episode. MRI +Rt pontine infarct and extensive chronic ischemia PMH-DM, neuropathy, HTN   Assessment / Plan / Recommendation Clinical Impression   Pt's cognitive-linguistic skills appear to be within functional limits, at her baseline. She endorses mild short-term memory difficulties premorbidly. She has mild left facial droop; speech is clear without dysarthria, verbal expression and auditory comprehension intact for mod complex conversation. Pt scored 26/30 on MOCA Basic (adjusted for education level), which is within normal limits (26 and above). She had some difficulty with delayed recall (2/5), but was able to recall 5/5 words with category cue. Pt verbalized difficulties with mobility and need to take precautions at home, demonstrating good safety awareness. Niece to stay with pt at d/c. Pt, niece feel she is at her cognitive-linguistic baseline. No further skilled ST needs identified; SLP will s/o.    SLP Assessment  SLP  Recommendation/Assessment: Patient does not need any further Speech Lanaguage Pathology Services SLP Visit Diagnosis: Dysarthria and anarthria (R47.1);Cognitive communication deficit (R41.841)    Follow Up Recommendations  24 hour supervision/assistance    Frequency and Duration Other (Comment)(eval only)         SLP Evaluation Cognition  Overall Cognitive Status: Within Functional Limits for tasks assessed Arousal/Alertness: Awake/alert Orientation Level: Oriented X4 Attention: Selective Selective Attention: Appears intact(attention to cognitive tasks with auditory distractions ) Memory: Impaired Memory Impairment: Decreased short term memory(immediate recall: 4/5, delayed 2/5, 5/5 with category cue) Decreased Short Term Memory: Verbal basic Awareness: Appears intact Problem Solving: Appears intact Executive Function: Reasoning;Sequencing Reasoning: Appears intact Sequencing: Appears intact Safety/Judgment: Appears intact       Comprehension  Auditory Comprehension Overall Auditory Comprehension: Appears within functional limits for tasks assessed Yes/No Questions: Within Functional Limits Commands: Within Functional Limits Visual Recognition/Discrimination Discrimination: Within Function Limits Reading Comprehension Reading Status: Not tested    Expression Expression Primary Mode of Expression: Verbal Verbal Expression Overall Verbal Expression: Appears within functional limits for tasks assessed Written Expression Dominant Hand: Right Written Expression: Not tested   Oral / Motor  Oral Motor/Sensory Function Overall Oral Motor/Sensory Function: Mild impairment Facial ROM: Reduced left;Suspected CN VII (facial) dysfunction Facial Symmetry: Abnormal symmetry left;Suspected CN VII (facial) dysfunction Facial Strength: Within Functional Limits Facial Sensation: Within Functional Limits Lingual ROM: Within Functional Limits Lingual Symmetry: Within Functional  Limits Lingual Strength: Within Functional Limits Velum: Within Functional Limits Mandible: Within Functional Limits Motor Speech Overall Motor Speech: Appears within functional limits for tasks assessed Respiration: Within functional limits Phonation: Normal Resonance: Within functional limits Articulation: Within functional limitis Intelligibility: Intelligible Motor Planning: Witnin functional limits Motor Speech Errors: Not applicable  GO                   Tara Baton, MS, CCC-SLP Speech-Language Pathologist Acute Rehabilitation Services Pager: [phone] Office: [phone]  Tara Conner 04/08/2018, 3:35 PM

## 2018-04-08 NOTE — Progress Notes (Signed)
VASCULAR LAB   Patient had normal CTA of the head and neck. Please advise is carotid duplex still needed.  Thank you  Ahan Eisenberger, RVT 04/08/2018, 7:06 AM

## 2018-04-08 NOTE — Progress Notes (Signed)
Pt returned to unit from MRI.

## 2018-04-09 ENCOUNTER — Other Ambulatory Visit: Payer: Self-pay

## 2018-04-09 ENCOUNTER — Encounter (HOSPITAL_COMMUNITY): Payer: Self-pay | Admitting: *Deleted

## 2018-04-09 DIAGNOSIS — R55 Syncope and collapse: Secondary | ICD-10-CM

## 2018-04-09 DIAGNOSIS — I10 Essential (primary) hypertension: Secondary | ICD-10-CM

## 2018-04-09 DIAGNOSIS — Z794 Long term (current) use of insulin: Secondary | ICD-10-CM

## 2018-04-09 DIAGNOSIS — E1140 Type 2 diabetes mellitus with diabetic neuropathy, unspecified: Secondary | ICD-10-CM

## 2018-04-09 DIAGNOSIS — I639 Cerebral infarction, unspecified: Secondary | ICD-10-CM

## 2018-04-09 LAB — BASIC METABOLIC PANEL
Anion gap: 5 (ref 5–15)
BUN: 16 mg/dL (ref 8–23)
CO2: 27 mmol/L (ref 22–32)
Calcium: 8.8 mg/dL — ABNORMAL LOW (ref 8.9–10.3)
Chloride: 107 mmol/L (ref 98–111)
Creatinine, Ser: 1.1 mg/dL — ABNORMAL HIGH (ref 0.44–1.00)
GFR calc Af Amer: 57 mL/min — ABNORMAL LOW (ref >60)
GFR calc non Af Amer: 49 mL/min — ABNORMAL LOW (ref >60)
Glucose, Bld: 115 mg/dL — ABNORMAL HIGH (ref 70–99)
Potassium: 3.8 mmol/L (ref 3.5–5.1)
Sodium: 139 mmol/L (ref 135–145)

## 2018-04-09 LAB — CBC
HCT: 30 % — ABNORMAL LOW (ref 36.0–46.0)
Hemoglobin: 9.9 g/dL — ABNORMAL LOW (ref 12.0–15.0)
MCH: 28 pg (ref 26.0–34.0)
MCHC: 33 g/dL (ref 30.0–36.0)
MCV: 85 fL (ref 80.0–100.0)
Platelets: 366 10*3/uL (ref 150–400)
RBC: 3.53 MIL/uL — ABNORMAL LOW (ref 3.87–5.11)
RDW: 12 % (ref 11.5–15.5)
WBC: 6.4 10*3/uL (ref 4.0–10.5)
nRBC: 0 % (ref 0.0–0.2)

## 2018-04-09 LAB — GLUCOSE, CAPILLARY
Glucose-Capillary: 110 mg/dL — ABNORMAL HIGH (ref 70–99)
Glucose-Capillary: 227 mg/dL — ABNORMAL HIGH (ref 70–99)
Glucose-Capillary: 153 mg/dL — ABNORMAL HIGH (ref 70–99)
Glucose-Capillary: 141 mg/dL — ABNORMAL HIGH (ref 70–99)

## 2018-04-09 MED ORDER — ASPIRIN EC 81 MG PO TBEC
81.0000 mg | DELAYED_RELEASE_TABLET | Freq: Every day | ORAL | Status: DC
  Administered 2018-04-09 – 2018-04-10 (×2): 81 mg via ORAL
  Filled 2018-04-09 (×2): qty 1

## 2018-04-09 MED ORDER — CLOPIDOGREL BISULFATE 75 MG PO TABS
75.0000 mg | ORAL_TABLET | Freq: Every day | ORAL | Status: DC
  Administered 2018-04-09 – 2018-04-10 (×2): 75 mg via ORAL
  Filled 2018-04-09 (×2): qty 1

## 2018-04-09 NOTE — Progress Notes (Signed)
Occupational Therapy Treatment Patient Details Name: Tara Conner MRN: [id number] DOB: 14-Jun-1945 Today's Date: 04/09/2018    History of present illness Patient brought to ED s/p syncope 04/07/18. She had dizziness, nausea, and vomiting at time of episode. MRI +Rt pontine infarct and extensive chronic ischemia PMH-DM, neuropathy, HTN   OT comments  Pt presents sitting up in recliner, pleasant and willing to participate in therapy session. Pt completing functional mobility using RW with close supervision-minguard throughout. Pt completing toileting and standing grooming ADLs with minguardA and light minA for clothing management during toileting task. Reviewed safe transfer techniques for tub transfer to 3:1 with pt verbalizing understanding of transfer techniques. Pt reports she will return home with niece initially at time of discharge to assist with ADLs PRN. Feel POC remains appropriate at this time. Will continue to follow acutely to progress pt towards established OT goals.   Follow Up Recommendations  Home health OT;Supervision/Assistance - 24 hour(24hr initially)    Equipment Recommendations  Tub/shower bench    Recommendations for Other Services      Precautions / Restrictions Precautions Precautions: Fall Restrictions Weight Bearing Restrictions: No       Mobility Bed Mobility Overal bed mobility: Needs Assistance Bed Mobility: Supine to Sit     Supine to sit: Supervision     General bed mobility comments: pt in recliner upon arrival  Transfers Overall transfer level: Needs assistance Equipment used: Rolling walker (2 wheeled);None Transfers: Sit to/from Stand Sit to Stand: Supervision         General transfer comment: pt with good recall of safe hand placement during transitions; requires increased time but with no LOB and requires no physical assist    Balance Overall balance assessment: Needs assistance Sitting-balance support: No upper extremity  supported;Feet supported Sitting balance-Leahy Scale: Fair     Standing balance support: Single extremity supported;Bilateral upper extremity supported;During functional activity Standing balance-Leahy Scale: Fair Standing balance comment: pt reliant on UE support for dynamic mobility; able to static stand to wash hands with close supervision                           ADL either performed or assessed with clinical judgement   ADL Overall ADL's : Needs assistance/impaired     Grooming: Wash/dry hands;Supervision/safety;Standing Grooming Details (indicate cue type and reason): close S for safety                 Toilet Transfer: Min guard;Ambulation;Grab bars;RW Statistician Details (indicate cue type and reason): cues for use of grab bars to self assist; pt will have BSC to use at home for UE support Toileting- Clothing Manipulation and Hygiene: Minimal assistance;Sit to/from stand;Sitting/lateral lean Toileting - Clothing Manipulation Details (indicate cue type and reason): assist for gown management as pt had two gowns on; pt performing peri-care using lateral leans while seated   Tub/Shower Transfer Details (indicate cue type and reason): educated pt on use of 3:1 for shower seat and safe transfer techniques using RW, corresponding handout issued. Pt reports feeling too weak to attempt practicing this session and reports she will initially sponge bathe after return home until she feel she can safely step over tub ledge. recommnd pt practice with HH therapies initially and pt in agreement. Practiced standing at RW and marching LEs up as precursor/to simulate lifting LEs high enough to step over level  Functional mobility during ADLs: Min guard;Supervision/safety;Rolling walker       Vision  Perception     Praxis      Cognition Arousal/Alertness: Awake/alert Behavior During Therapy: WFL for tasks assessed/performed Overall Cognitive Status:  Impaired/Different from baseline Area of Impairment: Following commands;Problem solving                       Following Commands: Follows one step commands consistently;Follows one step commands with increased time;Follows multi-step commands inconsistently     Problem Solving: Slow processing;Difficulty sequencing;Requires verbal cues General Comments: increased time and cues for following multi-step instruction        Exercises     Shoulder Instructions       General Comments      Pertinent Vitals/ Pain       Pain Assessment: No/denies pain  Home Living   Living Arrangements: Alone                                      Prior Functioning/Environment              Frequency  Min 2X/week        Progress Toward Goals  OT Goals(current goals can now be found in the care plan section)  Progress towards OT goals: Progressing toward goals  Acute Rehab OT Goals Patient Stated Goal: improve strength and go home OT Goal Formulation: With patient Time For Goal Achievement: 04/22/18 Potential to Achieve Goals: Good  Plan Discharge plan remains appropriate    Co-evaluation                 AM-PAC PT "6 Clicks" Daily Activity     Outcome Measure   Help from another person eating meals?: None Help from another person taking care of personal grooming?: A Little Help from another person toileting, which includes using toliet, bedpan, or urinal?: A Little Help from another person bathing (including washing, rinsing, drying)?: A Little Help from another person to put on and taking off regular upper body clothing?: A Little Help from another person to put on and taking off regular lower body clothing?: A Little 6 Click Score: 19    End of Session Equipment Utilized During Treatment: Gait belt;Rolling walker  OT Visit Diagnosis: Unsteadiness on feet (R26.81);Dizziness and giddiness (R42)   Activity Tolerance Patient tolerated treatment  well   Patient Left in chair;with call bell/phone within reach   Nurse Communication Mobility status        Time: 4540-9811 OT Time Calculation (min): 37 min  Charges: OT General Charges $OT Visit: 1 Visit OT Treatments $Self Care/Home Management : 23-37 mins  Marcy Siren, OT Supplemental Rehabilitation Services Pager [phone] Office [phone]    Tara Conner 04/09/2018, 4:17 PM

## 2018-04-09 NOTE — Progress Notes (Signed)
Physical Therapy Treatment Patient Details Name: Tara Conner MRN: [id number] DOB: Jan 23, 1946 Today's Date: 04/09/2018    History of Present Illness Patient brought to ED s/p syncope 04/07/18. She had dizziness, nausea, and vomiting at time of episode. MRI +Rt pontine infarct and extensive chronic ischemia PMH-DM, neuropathy, HTN    PT Comments    Pt progressing towards physical therapy goals. Focus of session was continued stair training to prep pt for d/c home.  Overall she is functioning at a min guard assist to supervision level with RW for support. Processing continues to be delayed and pt requires increased time and VC's to complete multi-step commands. Will continue to follow and progress as able per POC.   Follow Up Recommendations  Home health PT;Supervision/Assistance - 24 hour     Equipment Recommendations  Rolling walker with 5" wheels    Recommendations for Other Services       Precautions / Restrictions Precautions Precautions: Fall Restrictions Weight Bearing Restrictions: No    Mobility  Bed Mobility Overal bed mobility: Needs Assistance Bed Mobility: Supine to Sit     Supine to sit: Supervision     General bed mobility comments: Increased effort and time required. HOB flat and rails lowered to simulate home environment.   Transfers Overall transfer level: Needs assistance Equipment used: Rolling walker (2 wheeled);None Transfers: Sit to/from Stand Sit to Stand: Supervision         General transfer comment: VC's for hand placement on seated surface for safety. Pt required increased time but did not demonstrate any unsteadiness or LOB.   Ambulation/Gait Ambulation/Gait assistance: Min guard;Supervision Gait Distance (Feet): 100 Feet(x2) Assistive device: Rolling walker (2 wheeled) Gait Pattern/deviations: Step-through pattern;Decreased stride length;Decreased weight shift to left;Wide base of support Gait velocity: Decreased Gait velocity  interpretation: 1.31 - 2.62 ft/sec, indicative of limited community ambulator General Gait Details: Initially pt required hands on guarding for safety. Pt appeared to be overwhelmed by the amount of obstacles (people and carts) in the hallway. When walking back from the gym the hallway had cleared and pt was able to progress to supervision level with the RW.    Stairs Stairs: Yes Stairs assistance: Min guard Stair Management: One rail Left;With cane;Forwards;Sideways Number of Stairs: 10(5x2) General stair comments: Initially attempted with SPC, and pt did well, however appeared guarded. Then practiced with BUE support on L railing and pt attempted sideways. She reports feeling more comfortable with siedeways negotiation.    Wheelchair Mobility    Modified Rankin (Stroke Patients Only) Modified Rankin (Stroke Patients Only) Pre-Morbid Rankin Score: Slight disability Modified Rankin: Moderately severe disability     Balance Overall balance assessment: Needs assistance Sitting-balance support: No upper extremity supported;Feet supported Sitting balance-Leahy Scale: Fair     Standing balance support: Single extremity supported;Bilateral upper extremity supported Standing balance-Leahy Scale: Poor                              Cognition Arousal/Alertness: Awake/alert Behavior During Therapy: WFL for tasks assessed/performed Overall Cognitive Status: Impaired/Different from baseline Area of Impairment: Following commands;Problem solving                       Following Commands: Follows one step commands consistently;Follows one step commands with increased time;Follows multi-step commands inconsistently     Problem Solving: Slow processing;Difficulty sequencing;Requires verbal cues        Exercises      General Comments  Pertinent Vitals/Pain Pain Assessment: No/denies pain    Home Living   Living Arrangements: Alone                   Prior Function            PT Goals (current goals can now be found in the care plan section) Acute Rehab PT Goals Patient Stated Goal: improve strength and go home PT Goal Formulation: With patient Time For Goal Achievement: 04/15/18 Potential to Achieve Goals: Good Progress towards PT goals: Progressing toward goals    Frequency    Min 4X/week      PT Plan Current plan remains appropriate    Co-evaluation              AM-PAC PT "6 Clicks" Daily Activity  Outcome Measure  Difficulty turning over in bed (including adjusting bedclothes, sheets and blankets)?: A Little Difficulty moving from lying on back to sitting on the side of the bed? : A Little Difficulty sitting down on and standing up from a chair with arms (e.g., wheelchair, bedside commode, etc,.)?: A Little Help needed moving to and from a bed to chair (including a wheelchair)?: A Little Help needed walking in hospital room?: A Little Help needed climbing 3-5 steps with a railing? : A Lot 6 Click Score: 17    End of Session Equipment Utilized During Treatment: Gait belt Activity Tolerance: Patient tolerated treatment well Patient left: in chair;with call bell/phone within reach Nurse Communication: Mobility status PT Visit Diagnosis: Other abnormalities of gait and mobility (R26.89);Other symptoms and signs involving the nervous system (R29.898)     Time: 8295-6213 PT Time Calculation (min) (ACUTE ONLY): 26 min  Charges:  $Gait Training: 23-37 mins                     Tara Conner, PT, DPT Acute Rehabilitation Services Pager: [phone] Office: [phone]    Tara Conner 04/09/2018, 3:07 PM

## 2018-04-09 NOTE — Care Management Note (Signed)
Case Management Note  Patient Details  Name: Chaney Ingram MRN: [id number] Date of Birth: 23-Aug-1945  Subjective/Objective:      Pt admitted with a stroke. She is from home alone.  Pt states her niece is going to stay with her for a while to provide supervision and assistance at home. DME: none Pt denies issues with obtaining her medications.  Pt states her [employer] provides 48 trips to MD appts, etc. She has not used any as of yet.              Action/Plan: Recommendations are for [facility] services. CM provided choice and she selected [facility].  MD please place HH orders and F2F.  Pt with orders for walker and 3 n 1. CM notified Fayrene Fearing with [facility] DME and he will deliver to the room.  CM following.  Expected Discharge Date:                  Expected Discharge Plan:  Home w Home Health Services  In-House Referral:     Discharge planning Services  CM Consult  Post Acute Care Choice:  Durable Medical Equipment, Home Health Choice offered to:  Patient  DME Arranged:  3-N-1, Walker rolling DME Agency:  [facility]  HH Arranged:    HH Agency:  [facility]  Status of Service:  In process, will continue to follow  If discussed at Long Length of Stay Meetings, dates discussed:    Additional Comments:  Kermit Balo, RN 04/09/2018, 12:46 PM

## 2018-04-09 NOTE — Progress Notes (Signed)
STROKE TEAM PROGRESS NOTE   HISTORY OF PRESENT ILLNESS (per record) Tara Conner is an 72 y.o. female  With PMH significant for DM 2, HTN presented to the ED  With complaints of syncope, n/v, dizziness, generalized weakness and transient left facial weakness, facial numbness.  Per patient she has not been feeling herself for the past 3-4 weeks since she received a high dose flu shot. Since then she has had some some recurrent HA (relieved with tylenol and advil), some dizziness (described as room spinning and sometimes lightheaded), and diarrhea that have completely resolved in between episodes.  Today while she was at church and standing she had a sudden onset of whole body weakness. So she asked to lean over on her son. She apparently then lost consciousness for about 30 seconds before coming around. Her son and others helped lower her to floor. She did not fall nor hit her head. She then regained consciousness and then had nausea and vomiting. This has never happened before and she has never had a stroke before. Denies any HA, CP, SOB, blurred vision during this episode.  Over the past 3 -4 weeks she has had intermittent episodes of dizziness/ lightheaded,  Blurry vision. She also states that her HA will begin in the nape of her neck and radiate up her neck to about the middle of her neck. This comes and goes and she is completely normal in between episodes.  In the ED she had an episode of left facial numbness, and some left facial droop witnessed by ER RN - lasted 20 min.  Admitted for stroke/TIA work up.  Denies taking ASA daily.  ED course:  BP:161/100 BG: 206 CTH: no hemorrhage  Date last known well: Date: 04/07/2018 Time last known well: Time: 11:30 tPA Given: No: outside of window Modified Rankin: Rankin Score=0 NIHSS:2; facial droop and left leg drift   SUBJECTIVE (INTERVAL HISTORY)  she stated that she is doing well. She has no complaints. Stroke workup is  completed.    OBJECTIVE Vitals:   04/09/18 0355 04/09/18 0757 04/09/18 1139 04/09/18 1603  BP: (!) 178/87 (!) 173/86 (!) 193/89 (!) 161/73  Pulse: 91 92 90 88  Resp: 18 18 18 18   Temp: 98.3 F (36.8 C) 98.1 F (36.7 C) 98.1 F (36.7 C) 98.2 F (36.8 C)  TempSrc: Oral Oral Oral Oral  SpO2: 98% 100% 99% 100%  Weight:      Height:        CBC:  Recent Labs  Lab 04/07/18 1240 04/08/18 1028 04/09/18 0636  WBC 12.2* 6.7 6.4  NEUTROABS 6.8  --   --   HGB 10.7* 9.7* 9.9*  HCT 33.4* 29.9* 30.0*  MCV 85.9 86.9 85.0  PLT 449* 361 366    Basic Metabolic Panel:  Recent Labs  Lab 04/08/18 1028 04/09/18 0636  NA 134* 139  K 4.2 3.8  CL 102 107  CO2 23 27  GLUCOSE 264* 115*  BUN 21 16  CREATININE 1.36* 1.10*  CALCIUM 8.7* 8.8*    Lipid Panel:     Component Value Date/Time   CHOL 120 04/08/2018 0523   TRIG 109 04/08/2018 0523   HDL 32 (L) 04/08/2018 0523   CHOLHDL 3.8 04/08/2018 0523   VLDL 22 04/08/2018 0523   LDLCALC 66 04/08/2018 0523   HgbA1c:  Lab Results  Component Value Date   HGBA1C 8.3 (H) 04/08/2018   Urine Drug Screen: No results found for: LABOPIA, COCAINSCRNUR, LABBENZ, AMPHETMU, THCU, LABBARB  Alcohol Level No results found for: ETH  IMAGING  Ct Angio Head W Or Wo Contrast Ct Angio Neck W Or Wo Contrast 04/07/2018 IMPRESSION:  Nonstenotic extracranial atheromatous change, without flow reducing carotid or vertebral stenosis. Mild irregularity of the mid to distal basilar, without flow reducing stenosis. No abnormal postcontrast enhancement.    Dg Chest 2 View 04/07/2018  IMPRESSION:  No active cardiopulmonary disease.    Ct Head Wo Contrast 04/07/2018 IMPRESSION:  No acute intracranial process. Atrophy and chronic microvascular ischemic changes.    Mr Cervical Spine Wo Contrast 04/07/2018 IMPRESSION:  1. Shallow central disc protrusions at C3-4, C5-6 and C7-T1 without significant neural compression.  2. No findings for foraminal  stenosis.    Mr Maxine Glenn Head Wo Contrast  04/07/2018 IMPRESSION:  Acute to subacute RIGHT mid pontine infarct. Correlate clinically as a cause for LEFT leg and LEFT facial weakness. Extensive chronic ischemia, throughout the cerebral hemispheres and brainstem, some foci of which show chronic hemorrhage. Atrophy and small vessel disease. No proximal intracranial flow reducing lesion. Mild to moderate basilar irregularity without flow-limiting stenosis.     Transthoracic Echocardiogram  04/08/2018 Study Conclusions - Left ventricle: The cavity size was normal. Wall thickness was   increased in a pattern of mild LVH. Systolic function was normal.   The estimated ejection fraction was in the range of 60% to 65%.   Wall motion was normal; there were no regional wall motion   abnormalities. Doppler parameters are consistent with abnormal   left ventricular relaxation (grade 1 diastolic dysfunction).   Doppler parameters are consistent with high ventricular filling   pressure. - Aortic valve: Trileaflet; mildly thickened leaflets. - Mitral valve: Moderately calcified annulus. - Systemic veins: IVC poorly visualized but appears dilated.    PHYSICAL EXAM Blood pressure (!) 161/73, pulse 88, temperature 98.2 F (36.8 C), temperature source Oral, resp. rate 18, height 5' 6.5" (1.689 m), weight 90.7 kg, SpO2 100 %. Neurological Exam :  Awake alert oriented x 3 normal speech and language. Mild left lower face asymmetry. Tongue midline. No drift. Mild diminished fine finger movements on left. Orbits right over left upper extremity. Mild left grip weak.. Normal sensation . Normal coordination.       ASSESSMENT/PLAN Tara Conner is a 72 y.o. female with history of DM and Htn presenting with syncope, n/v, dizziness, generalized weakness and transient left facial weakness, facial numbness. She did not receive IV t-PA due to late presentation.  Stroke:  RIGHT mid pontine infarct - small  vessel disease   Resultant  Left side weakness.  CT head - No acute intracranial process  MRI head - Acute to subacute RIGHT mid pontine infarct.   MRA head - Extensive chronic ischemia, throughout the cerebral hemispheres and brainstem, some foci of which show chronic hemorrhage  CTA H&N - Nonstenotic extracranial atheromatous change, without flow reducing carotid or vertebral stenosis  Carotid Doppler - CTA neck performed - carotid dopplers not indicated.  2D Echo - EF 60 - 65%. No cardiac source of emboli identified. IVC poorly visualized but appears dilated.  LDL - 66  HgbA1c - 8.3  VTE prophylaxis - Lovenox  Diet  - Carb modified with thin liquids  No antithrombotic prior to admission, now on aspirin 325 mg daily  Patient counseled to be compliant with her antithrombotic medications  Ongoing aggressive stroke risk factor management  Therapy recommendations:  HHPT/OT  Disposition:  home  Hypertension  Stable . Permissive hypertension (OK if < 220/120)  but gradually normalize in 5-7 days . Long-term BP goal normotensive  Hyperlipidemia  Lipid lowering medication PTA:  Lipitor 40 mg daily  LDL 66, goal < 70  Current lipid lowering medication: Lipitor 40 mg daily  Continue statin at discharge  Diabetes  HgbA1c 8.3 , goal < 7.0  Uncontrolled  Other Stroke Risk Factors  Advanced age  Obesity, Body mass index is 31.8 kg/m., recommend weight loss, diet and exercise as appropriate    Previous strokes    Other Active Problems  Anemia - 9.7 / 29.9  Creatinine - 1.36    Plan  recommend aspirin 81 mg and Plavix 75 mg daily for 3 weeks followed by aspirin alone.  Hospital day # 1  She has evidence of prior lacunar infarcts on brain imaging and presented with fluctuating weakness due to new pontine lacunar infarct. Recommend dual antiplatelet therapy of aspirin 81 and Plavix 75 mg daily for 3 weeks followed by aspirin alone. Aggressive risk factor  modification. Discussed with Dr. Margo Aye. greater than 50% time during this 25 minute visit was spent on counseling and coordination of care about her recurrent lacunar strokes and answering questionsStroke team will sign off. Kindly call for questions.  [name] Heady, MD To contact Stroke Continuity provider, please refer to [email or web address]. After hours, contact General Neurology

## 2018-04-09 NOTE — Progress Notes (Signed)
PROGRESS NOTE  Tara Conner ZOX:[id number] DOB: [birth year] DOA: 04/07/2018 PCP: Mliss Sax, MD  HPI/Recap of past 24 hours: Tara Conner is an 72 y.o. female with past medical history significant for diabetes mellitus with diabetic neuropathy on Neurontin, hypertension and anemia who has been noncompliant with her aspirin presenting today with a syncopal episode at church today. The patient was standing beside her son when she leaned over and felt very weak then she lost consciousness for a few seconds.  MRI brain done on 04/07/2018 revealed acute to subacute right mid pontine infarct.  New hospitalist consulted and followed.  04/09/2018: Patient seen and examined at bedside.  Reports weakness is improving.  PT assessed and recommended HHPT.  Admits to forgetting to take her medications at times.   Assessment/Plan: Active Problems:   Essential hypertension   Type 2 diabetes mellitus with diabetic neuropathy, with long-term current use of insulin (HCC)   TIA (transient ischemic attack)   CVA (cerebral vascular accident) (HCC)  Acute to subacute right mid pontine infarct Continue full dose aspirin, Lipitor 40 mg daily Negative CTA neck Sinus rhythm PFO not reported on 2D echo which had normal LVEF and grade 1 diastolic dysfunction  Type 2 diabetes complicated by hyperglycemia Hemoglobin A1c 8.3 on 04/08/2018 Continue Lantus 25 mg nightly Continue insulin sliding scale  GERD Continue Pepcid  Hyperlipidemia Continue Lipitor    Code Status: Full code  Family Communication: None at bedside  Disposition Plan: Home possibly tomorrow 04/10/2018   Consultants:  Neuro hospitalist  Procedures:  None  Antimicrobials:  None  DVT prophylaxis: Subcu Lovenox daily   Objective: Vitals:   04/08/18 2341 04/09/18 0355 04/09/18 0757 04/09/18 1139  BP: (!) 178/88 (!) 178/87 (!) 173/86 (!) 193/89  Pulse: 90 91 92 90  Resp: 20 18 18 18   Temp: 98.3 F  (36.8 C) 98.3 F (36.8 C) 98.1 F (36.7 C) 98.1 F (36.7 C)  TempSrc: Oral Oral Oral Oral  SpO2: 99% 98% 100% 99%  Weight:      Height:        Intake/Output Summary (Last 24 hours) at 04/09/2018 1259 Last data filed at 04/09/2018 0900 Gross per 24 hour  Intake 240 ml  Output -  Net 240 ml   Filed Weights   04/07/18 1220  Weight: 90.7 kg    Exam:  . General: 72 y.o. year-old female well developed well nourished in no acute distress.  Alert and oriented x3. . Cardiovascular: Regular rate and rhythm with no rubs or gallops.  No thyromegaly or JVD noted.   Marland Kitchen Respiratory: Clear to auscultation with no wheezes or rales. Good inspiratory effort. . Abdomen: Soft nontender nondistended with normal bowel sounds x4 quadrants. . Musculoskeletal: No lower extremity edema. 2/4 pulses in all 4 extremities.  Moves all 4 extremities. . Skin: No ulcerative lesions noted or rashes, . Psychiatry: Mood is appropriate for condition and setting   Data Reviewed: CBC: Recent Labs  Lab 04/07/18 1240 04/08/18 1028 04/09/18 0636  WBC 12.2* 6.7 6.4  NEUTROABS 6.8  --   --   HGB 10.7* 9.7* 9.9*  HCT 33.4* 29.9* 30.0*  MCV 85.9 86.9 85.0  PLT 449* 361 366   Basic Metabolic Panel: Recent Labs  Lab 04/07/18 1240 04/08/18 1028 04/09/18 0636  NA 138 134* 139  K 3.5 4.2 3.8  CL 104 102 107  CO2 23 23 27   GLUCOSE 206* 264* 115*  BUN 18 21 16   CREATININE 1.21* 1.36* 1.10*  CALCIUM 9.2 8.7* 8.8*   GFR: Estimated Creatinine Clearance: 53.8 mL/min (A) (by C-G formula based on SCr of 1.1 mg/dL (H)). Liver Function Tests: Recent Labs  Lab 04/07/18 1240  AST 21  ALT 17  ALKPHOS 80  BILITOT 0.6  PROT 8.1  ALBUMIN 3.5   No results for input(s): LIPASE, AMYLASE in the last 168 hours. No results for input(s): AMMONIA in the last 168 hours. Coagulation Profile: No results for input(s): INR, PROTIME in the last 168 hours. Cardiac Enzymes: No results for input(s): CKTOTAL, CKMB,  CKMBINDEX, TROPONINI in the last 168 hours. BNP (last 3 results) No results for input(s): PROBNP in the last 8760 hours. HbA1C: Recent Labs    04/08/18 0523  HGBA1C 8.3*   CBG: Recent Labs  Lab 04/08/18 1137 04/08/18 1554 04/08/18 2128 04/09/18 0609 04/09/18 1106  GLUCAP 219* 179* 261* 110* 227*   Lipid Profile: Recent Labs    04/08/18 0523  CHOL 120  HDL 32*  LDLCALC 66  TRIG 161  CHOLHDL 3.8   Thyroid Function Tests: No results for input(s): TSH, T4TOTAL, FREET4, T3FREE, THYROIDAB in the last 72 hours. Anemia Panel: No results for input(s): VITAMINB12, FOLATE, FERRITIN, TIBC, IRON, RETICCTPCT in the last 72 hours. Urine analysis:    Component Value Date/Time   COLORURINE YELLOW 03/27/2018 1814   APPEARANCEUR HAZY (A) 03/27/2018 1814   LABSPEC 1.017 03/27/2018 1814   PHURINE 5.0 03/27/2018 1814   GLUCOSEU NEGATIVE 03/27/2018 1814   GLUCOSEU NEGATIVE 01/30/2018 1441   HGBUR NEGATIVE 03/27/2018 1814   BILIRUBINUR NEGATIVE 03/27/2018 1814   KETONESUR NEGATIVE 03/27/2018 1814   PROTEINUR NEGATIVE 03/27/2018 1814   UROBILINOGEN 0.2 01/30/2018 1441   NITRITE NEGATIVE 03/27/2018 1814   LEUKOCYTESUR LARGE (A) 03/27/2018 1814   Sepsis Labs: @LABRCNTIP (procalcitonin:4,lacticidven:4)  )No results found for this or any previous visit (from the past 240 hour(s)).    Studies: No results found.  Scheduled Meds: . aspirin  300 mg Rectal Daily   Or  . aspirin  325 mg Oral Daily  . atorvastatin  40 mg Oral Daily  . enoxaparin (LOVENOX) injection  40 mg Subcutaneous Q24H  . famotidine  20 mg Oral Daily  . insulin aspart  0-15 Units Subcutaneous TID WC  . insulin aspart  0-5 Units Subcutaneous QHS  . insulin glargine  25 Units Subcutaneous QHS  . multivitamin with minerals   Oral Daily    Continuous Infusions: . sodium chloride 50 mL/hr at 04/08/18 1405     LOS: 1 day     Darlin Drop, MD [facility] Pager [phone]  If 7PM-7AM, please  contact night-coverage [email or web address] Password [facility] 04/09/2018, 12:59 PM

## 2018-04-10 LAB — GLUCOSE, CAPILLARY
Glucose-Capillary: 127 mg/dL — ABNORMAL HIGH (ref 70–99)
Glucose-Capillary: 223 mg/dL — ABNORMAL HIGH (ref 70–99)

## 2018-04-10 MED ORDER — ATORVASTATIN CALCIUM 40 MG PO TABS: 40.0000 mg | ORAL_TABLET | Freq: Every day | ORAL | 0 refills | Status: DC

## 2018-04-10 MED ORDER — ASPIRIN EC 81 MG PO TBEC: 81.0000 mg | DELAYED_RELEASE_TABLET | Freq: Every day | ORAL | 0 refills | Status: DC

## 2018-04-10 MED ORDER — INSULIN LISPRO 100 UNIT/ML ~~LOC~~ SOLN: 0.0000 [IU] | Freq: Three times a day (TID) | SUBCUTANEOUS | 0 refills | Status: DC

## 2018-04-10 MED ORDER — CLOPIDOGREL BISULFATE 75 MG PO TABS: 75.0000 mg | ORAL_TABLET | Freq: Every day | ORAL | 0 refills | Status: DC

## 2018-04-10 MED ORDER — INSULIN GLARGINE 100 UNIT/ML ~~LOC~~ SOLN: 25.0000 [IU] | Freq: Every day | SUBCUTANEOUS | 0 refills | Status: DC

## 2018-04-10 MED ORDER — AMLODIPINE BESYLATE 5 MG PO TABS: 5.0000 mg | ORAL_TABLET | Freq: Every day | ORAL | 0 refills | Status: DC

## 2018-04-10 MED ORDER — AMLODIPINE BESYLATE 5 MG PO TABS
5.0000 mg | ORAL_TABLET | Freq: Every day | ORAL | Status: DC
  Administered 2018-04-10: 5 mg via ORAL
  Filled 2018-04-10 (×2): qty 1

## 2018-04-10 NOTE — Discharge Summary (Signed)
Discharge Summary  Tara Conner YJE:[id number] DOB: Mar 23, 1946  PCP: Libby Maw, MD  Admit date: 04/07/2018 Discharge date: 04/10/2018  Time spent: 35 minutes   Recommendations for Outpatient Follow-up:  1. Follow up with neurology 2. Follow up with PCP 3. Take your medications as prescribed 4. Continue PT 5. Fall precautions   Discharge Diagnoses:  Active Hospital Problems   Diagnosis Date Noted  . CVA (cerebral vascular accident) ([facility]) 04/08/2018  . TIA (transient ischemic attack) 04/07/2018  . Essential hypertension 01/30/2018  . Type 2 diabetes mellitus with diabetic neuropathy, with long-term current use of insulin ([place]) 01/30/2018    Resolved Hospital Problems  No resolved problems to display.    Discharge Condition: Stable   Diet recommendation: Resume previous diet   Vitals:   04/10/18 0414 04/10/18 0815  BP: (!) 180/95 (!) 150/85  Pulse: 96 92  Resp: 20 20  Temp: 98.4 F (36.9 C) 98.2 F (36.8 C)  SpO2: 93% 100%    History of present illness:  Tara Conner an 72 y.o.femalewith past medical history significant for diabetes mellitus with diabetic neuropathy on Neurontin, hypertension and anemia who has been noncompliant with her aspirin presenting today with a syncopal episode at church today. The patient was standing beside her son when she leaned over and felt very weak then she lost consciousness for a few seconds.  MRI brain done on 04/07/2018 revealed acute to subacute right mid pontine infarct.  Neuro hospitalist consulted and followed. PT assessed and recommended HHPT.  Had not been taking her aspirin daily.  04/10/18: Seen and examined at her bedside. No new complaints. States she lives alone and her niece will stay with her for the next 2 weeks.  On the day of discharge, the patient was hemodynamically stable. She will need to follow up with neurology, her PCP post hospitalization.   Hospital Course:  Active  Problems:   Essential hypertension   Type 2 diabetes mellitus with diabetic neuropathy, with long-term current use of insulin ([facility])   TIA (transient ischemic attack)   CVA (cerebral vascular accident) ([place])  Acute to subacute right mid pontine infarct Continue full dose aspirin, Lipitor 40 mg daily Negative CTA neck Sinus rhythm PFO not reported on 2D echo which had normal LVEF and grade 1 diastolic dysfunction  Type 2 diabetes complicated by hyperglycemia Hemoglobin A1c 8.3 on 04/08/2018 Continue Lantus 25 mg nightly Continue insulin sliding scale Hold off Trulicity-Defer to PCP to restart  GERD Continue Pepcid  Hyperlipidemia ldl 66, well controlled Continue Lipitor    Code Status: Full code    Consultants:  Neuro hospitalist  Procedures:  None  Discharge Exam: BP (!) 150/85 (BP Location: Left Arm)   Pulse 92   Temp 98.2 F (36.8 C) (Oral)   Resp 20   Ht 5' 6.5" (1.689 m)   Wt 90.7 kg   LMP  (LMP Unknown)   SpO2 100%   BMI 31.80 kg/m  . General: 72 y.o. year-old female well developed well nourished in no acute distress.  Alert and oriented x3. . Cardiovascular: Regular rate and rhythm with no rubs or gallops.  No thyromegaly or JVD noted.   Marland Kitchen Respiratory: Clear to auscultation with no wheezes or rales. Good inspiratory effort. . Abdomen: Soft nontender nondistended with normal bowel sounds x4 quadrants. . Musculoskeletal: No lower extremity edema. 2/4 pulses in all 4 extremities. . Skin: No ulcerative lesions noted or rashes, . Psychiatry: Mood is appropriate for condition and setting  Discharge Instructions  You were cared for by a hospitalist during your hospital stay. If you have any questions about your discharge medications or the care you received while you were in the hospital after you are discharged, you can call the unit and asked to speak with the hospitalist on call if the hospitalist that took care of you is not available. Once you  are discharged, your primary care physician will handle any further medical issues. Please note that NO REFILLS for any discharge medications will be authorized once you are discharged, as it is imperative that you return to your primary care physician (or establish a relationship with a primary care physician if you do not have one) for your aftercare needs so that they can reassess your need for medications and monitor your lab values.   Allergies as of 04/10/2018      Reactions   Sulfa Antibiotics Other (See Comments)   Pt unable to report reaction      Medication List    STOP taking these medications   Dulaglutide 0.75 MG/0.5ML Sopn   lisinopril 20 MG tablet Commonly known as:  PRINIVIL,ZESTRIL   METAMUCIL FIBER PO     TAKE these medications   ACCU-CHEK AVIVA PLUS w/Device Kit Use to test blood sugars 2 times daily, insurance does not cover one touch anymore.   accu-chek soft touch lancets Use to check blood sugars two times daily.   amLODipine 5 MG tablet Commonly known as:  NORVASC Take 1 tablet (5 mg total) by mouth daily. Start taking on:  04/11/2018   aspirin EC 81 MG tablet Take 1 tablet (81 mg total) by mouth daily.   atorvastatin 40 MG tablet Commonly known as:  LIPITOR Take 1 tablet (40 mg total) by mouth daily.   clopidogrel 75 MG tablet Commonly known as:  PLAVIX Take 1 tablet (75 mg total) by mouth daily. Start taking on:  04/11/2018   ferrous sulfate 325 (65 FE) MG tablet Take 1 tablet (325 mg total) by mouth daily with breakfast. What changed:  how much to take   gabapentin 300 MG capsule Commonly known as:  NEURONTIN Take one at night for one week and then increase to one twice daily as tolerated. What changed:    how much to take  how to take this  when to take this  reasons to take this  additional instructions   glucose blood test strip Use to test blood sugars two times daily.   insulin glargine 100 UNIT/ML injection Commonly  known as:  LANTUS Inject 0.25 mLs (25 Units total) into the skin at bedtime. What changed:    how much to take  additional instructions   insulin lispro 100 UNIT/ML injection Commonly known as:  HUMALOG Inject 0-0.15 mLs (0-15 Units total) into the skin 3 (three) times daily with meals.   MULTIVITAMIN ADULT PO Take 1 tablet by mouth daily.   NEEDLE (DISP) 30 G 30G X 1" Misc To use with Lantus.   ranitidine 150 MG capsule Commonly known as:  ZANTAC Take 1 capsule (150 mg total) by mouth 2 (two) times daily. What changed:    when to take this  reasons to take this   REFRESH DRY EYE THERAPY OP Place 1-2 drops into both eyes 3 (three) times daily as needed (for dryness).            Durable Medical Equipment  (From admission, onward)         Start     Ordered  04/09/18 1224  For home use only DME 3 n 1  Once     04/09/18 1223   04/09/18 1224  For home use only DME Walker rolling  Once    Question:  Patient needs a walker to treat with the following condition  Answer:  Stroke [facility])   04/09/18 1223         Allergies  Allergen Reactions  . Sulfa Antibiotics Other (See Comments)    Pt unable to report reaction   Follow-up Information    Libby Maw, MD. Call in 1 day(s).   Specialty:  Family Medicine Why:  please call for a post hospital appointment Contact information: [place] [address] [phone]        [place]. Call in 1 day(s).   Why:  please call for a post hospital follow up appointment Contact information: [address]     [address] [place] [address] [phone]           The results of significant diagnostics from this hospitalization (including imaging, microbiology, ancillary and laboratory) are listed below for reference.    Significant Diagnostic Studies: Ct Angio Head W Or Wo Contrast  Result Date: 04/07/2018 CLINICAL DATA:  Syncopal episode  at church earlier today. LEFT-sided weakness. EXAM: CT ANGIOGRAPHY HEAD AND NECK TECHNIQUE: Multidetector CT imaging of the head and neck was performed using the standard protocol during bolus administration of intravenous contrast. Multiplanar CT image reconstructions and MIPs were obtained to evaluate the vascular anatomy. Carotid stenosis measurements (when applicable) are obtained utilizing NASCET criteria, using the distal internal carotid diameter as the denominator. CONTRAST:  180m ISOVUE-370 IOPAMIDOL (ISOVUE-370) INJECTION 76% COMPARISON:  CT head earlier today. FINDINGS: CTA NECK FINDINGS Aortic arch: Standard branching. Imaged portion shows no evidence of aneurysm or dissection. No significant stenosis of the major arch vessel origins. Moderate calcific atheromatous change of the proximal RIGHT subclavian. Right carotid system: No evidence of dissection, stenosis (50% or greater) or occlusion. Mild to moderate calcific plaque extends into the RIGHT ICA, non flow reducing. Left carotid system: No evidence of dissection, stenosis (50% or greater) or occlusion. Mild to moderate calcific plaque extends into the LEFT ICA, non flow reducing. Vertebral arteries: Dominant LEFT vertebral.  Both are patent. Skeleton: No worrisome osseous lesion. Other neck: Mild thyromegaly.  No adenopathy or mass. Upper chest: Lung apices are clear. Review of the MIP images confirms the above findings CTA HEAD FINDINGS Anterior circulation: Nonstenotic atheromatous calcification of the cavernous carotid arteries bilaterally. ICA termini widely patent. No ACA or MCA stenosis or occlusion. Posterior circulation: LEFT vertebral is the dominant/sole contributor to the basilar. 50% stenosis, LEFT V4 segment. RIGHT vertebral ends in PICA. There is nonstenotic irregularity of the distal basilar, see for instance 8:109, 9:93. Fetal origin RIGHT PCA. LEFT P2 PCA stenosis in its ambient segment, 50-75%. Venous sinuses: As permitted by  contrast timing, patent. Anatomic variants: Fetal RIGHT PCA. Delayed phase: No abnormal postcontrast enhancement. Review of the MIP images confirms the above findings IMPRESSION: Nonstenotic extracranial atheromatous change, without flow reducing carotid or vertebral stenosis. Mild irregularity of the mid to distal basilar, without flow reducing stenosis. No abnormal postcontrast enhancement. Electronically Signed   By: JStaci RighterM.D.   On: 04/07/2018 20:17   Dg Chest 2 View  Result Date: 04/07/2018 CLINICAL DATA:  TIA, syncope. EXAM: CHEST - 2 VIEW COMPARISON:  None. FINDINGS: Heart size is upper normal. Lungs are clear. No pleural  effusion or pneumothorax seen. No acute or suspicious osseous finding. IMPRESSION: No active cardiopulmonary disease. Electronically Signed   By: Franki Cabot M.D.   On: 04/07/2018 19:08   Ct Head Wo Contrast  Result Date: 04/07/2018 CLINICAL DATA:  Patient with syncopal episode. EXAM: CT HEAD WITHOUT CONTRAST TECHNIQUE: Contiguous axial images were obtained from the base of the skull through the vertex without intravenous contrast. COMPARISON:  None. FINDINGS: Brain: Ventricles and sulci are prominent compatible with atrophy. Periventricular and subcortical white matter hypodensity compatible with chronic microvascular ischemic changes. Old right basal ganglia lacunar infarct. No evidence for acute cortically based infarct, intracranial hemorrhage, mass lesion or mass-effect. Vascular: Unremarkable. Skull: Intact. Sinuses/Orbits: Paranasal sinuses are well aerated. Mastoid air cells are unremarkable. Orbits are unremarkable. Other: None. IMPRESSION: No acute intracranial process. Atrophy and chronic microvascular ischemic changes. Electronically Signed   By: Lovey Newcomer M.D.   On: 04/07/2018 14:16   Ct Angio Neck W Or Wo Contrast  Result Date: 04/07/2018 CLINICAL DATA:  Syncopal episode at church earlier today. LEFT-sided weakness. EXAM: CT ANGIOGRAPHY HEAD AND NECK  TECHNIQUE: Multidetector CT imaging of the head and neck was performed using the standard protocol during bolus administration of intravenous contrast. Multiplanar CT image reconstructions and MIPs were obtained to evaluate the vascular anatomy. Carotid stenosis measurements (when applicable) are obtained utilizing NASCET criteria, using the distal internal carotid diameter as the denominator. CONTRAST:  129m ISOVUE-370 IOPAMIDOL (ISOVUE-370) INJECTION 76% COMPARISON:  CT head earlier today. FINDINGS: CTA NECK FINDINGS Aortic arch: Standard branching. Imaged portion shows no evidence of aneurysm or dissection. No significant stenosis of the major arch vessel origins. Moderate calcific atheromatous change of the proximal RIGHT subclavian. Right carotid system: No evidence of dissection, stenosis (50% or greater) or occlusion. Mild to moderate calcific plaque extends into the RIGHT ICA, non flow reducing. Left carotid system: No evidence of dissection, stenosis (50% or greater) or occlusion. Mild to moderate calcific plaque extends into the LEFT ICA, non flow reducing. Vertebral arteries: Dominant LEFT vertebral.  Both are patent. Skeleton: No worrisome osseous lesion. Other neck: Mild thyromegaly.  No adenopathy or mass. Upper chest: Lung apices are clear. Review of the MIP images confirms the above findings CTA HEAD FINDINGS Anterior circulation: Nonstenotic atheromatous calcification of the cavernous carotid arteries bilaterally. ICA termini widely patent. No ACA or MCA stenosis or occlusion. Posterior circulation: LEFT vertebral is the dominant/sole contributor to the basilar. 50% stenosis, LEFT V4 segment. RIGHT vertebral ends in PICA. There is nonstenotic irregularity of the distal basilar, see for instance 8:109, 9:93. Fetal origin RIGHT PCA. LEFT P2 PCA stenosis in its ambient segment, 50-75%. Venous sinuses: As permitted by contrast timing, patent. Anatomic variants: Fetal RIGHT PCA. Delayed phase: No  abnormal postcontrast enhancement. Review of the MIP images confirms the above findings IMPRESSION: Nonstenotic extracranial atheromatous change, without flow reducing carotid or vertebral stenosis. Mild irregularity of the mid to distal basilar, without flow reducing stenosis. No abnormal postcontrast enhancement. Electronically Signed   By: JStaci RighterM.D.   On: 04/07/2018 20:17   Mr Brain Wo Contrast  Result Date: 04/07/2018 CLINICAL DATA:  Malaise for 3-4 weeks after receiving a high-dose flu shot. Recurring headache and dizziness. Syncopal episode today, now with LEFT-sided weakness. EXAM: MRI HEAD WITHOUT CONTRAST MRA HEAD WITHOUT CONTRAST TECHNIQUE: Multiplanar, multiecho pulse sequences of the brain and surrounding structures were obtained without intravenous contrast. Angiographic images of the head were obtained using MRA technique without contrast. COMPARISON:  CTA head neck  reported separately. FINDINGS: MRI HEAD FINDINGS Brain: In the RIGHT paramedian pons, there is a heterogeneous 6 x 11 mm region of restricted and facilitated diffusion, more restricted medially, low to normal ADC, and correlating with abnormal T2 and FLAIR hyperintensity, but not frank lacunar change, consistent with an acute/subacute brainstem infarct. Immediately adjacent, and slightly more cephalad on the LEFT, there is a large chronic LEFT paramedian pons lacunar infarct. Generalized atrophy. T2 and FLAIR hyperintensities throughout the white matter, moderately advanced, consistent with small vessel disease. In addition to the brainstem ischemic change, BILATERAL basal ganglia and deep white matter lacunar infarcts are observed. Punctate foci of chronic hemorrhage affects the LEFT parietal cortex and subcortical white matter, which could represent a remote ischemic or traumatic insult. The LEFT upper pontine lacune is hemorrhagic as well. Vascular: Reported separately. Skull and upper cervical spine: Normal marrow signal.  No upper cervical lesions. Sinuses/Orbits: Chronic maxillary sinus disease. No layering fluid. Negative orbits. Other: None. MRA HEAD FINDINGS The internal carotid arteries are widely patent. The basilar artery is supplied solely by the LEFT vertebral, which is mildly irregular in its V4 segment. The basilar demonstrates mild to moderate irregularity without flow-limiting stenosis; this can be seen in its mid to distal aspects. Fetal origin RIGHT PCA. 50-75% stenosis LEFT P2 PCA ambient segment. Moderate to severely diseased RIGHT PCA P3 segment and beyond. Mild irregularity of the M2 and M3 MCA segments consistent with intracranial atherosclerotic disease. IMPRESSION: Acute to subacute RIGHT mid pontine infarct. Correlate clinically as a cause for LEFT leg and LEFT facial weakness. Extensive chronic ischemia, throughout the cerebral hemispheres and brainstem, some foci of which show chronic hemorrhage. Atrophy and small vessel disease. No proximal intracranial flow reducing lesion. Mild to moderate basilar irregularity without flow-limiting stenosis. Electronically Signed   By: Staci Righter M.D.   On: 04/07/2018 20:56   Mr Cervical Spine Wo Contrast  Result Date: 04/07/2018 CLINICAL DATA:  Three-week history syncope, nausea, vomiting and headaches and neck pain. EXAM: MRI CERVICAL SPINE WITHOUT CONTRAST TECHNIQUE: Multiplanar, multisequence MR imaging of the cervical spine was performed. No intravenous contrast was administered. COMPARISON:  Neck CT 04/07/2018 FINDINGS: Alignment: Normal Vertebrae: Endplate reactive changes but no bone lesions or fractures. Cord: Moderate artifact through the cord but no definite cord lesions or syrinx. Posterior Fossa, vertebral arteries, paraspinal tissues: Evidence of white matter disease and or prior brainstem infarcts. Moderate cerebellar atrophy. Moderate degenerative changes at C1-2 but no significant pannus formation or mass effect on the upper cervical cord. Disc  levels: C2-3: No significant findings. C3-4: Very shallow central disc protrusion with minimal impression on the ventral thecal sac. No spinal or foraminal stenosis. C4-5: No significant findings. C5-6: Moderate disc disease and facet disease. Shallow central disc protrusion with focal mass effect on the ventral thecal sac and slight narrowing the ventral CSF space. No foraminal stenosis. C6-7: No significant findings. C7-T1: Shallow central disc protrusion with mild focal impression on the ventral thecal sac. No foraminal stenosis. IMPRESSION: 1. Shallow central disc protrusions at C3-4, C5-6 and C7-T1 without significant neural compression. 2. No findings for foraminal stenosis. Electronically Signed   By: Marijo Sanes M.D.   On: 04/07/2018 20:58   Mr Jodene Nam Head Wo Contrast  Result Date: 04/07/2018 CLINICAL DATA:  Malaise for 3-4 weeks after receiving a high-dose flu shot. Recurring headache and dizziness. Syncopal episode today, now with LEFT-sided weakness. EXAM: MRI HEAD WITHOUT CONTRAST MRA HEAD WITHOUT CONTRAST TECHNIQUE: Multiplanar, multiecho pulse sequences of the brain  and surrounding structures were obtained without intravenous contrast. Angiographic images of the head were obtained using MRA technique without contrast. COMPARISON:  CTA head neck reported separately. FINDINGS: MRI HEAD FINDINGS Brain: In the RIGHT paramedian pons, there is a heterogeneous 6 x 11 mm region of restricted and facilitated diffusion, more restricted medially, low to normal ADC, and correlating with abnormal T2 and FLAIR hyperintensity, but not frank lacunar change, consistent with an acute/subacute brainstem infarct. Immediately adjacent, and slightly more cephalad on the LEFT, there is a large chronic LEFT paramedian pons lacunar infarct. Generalized atrophy. T2 and FLAIR hyperintensities throughout the white matter, moderately advanced, consistent with small vessel disease. In addition to the brainstem ischemic change,  BILATERAL basal ganglia and deep white matter lacunar infarcts are observed. Punctate foci of chronic hemorrhage affects the LEFT parietal cortex and subcortical white matter, which could represent a remote ischemic or traumatic insult. The LEFT upper pontine lacune is hemorrhagic as well. Vascular: Reported separately. Skull and upper cervical spine: Normal marrow signal. No upper cervical lesions. Sinuses/Orbits: Chronic maxillary sinus disease. No layering fluid. Negative orbits. Other: None. MRA HEAD FINDINGS The internal carotid arteries are widely patent. The basilar artery is supplied solely by the LEFT vertebral, which is mildly irregular in its V4 segment. The basilar demonstrates mild to moderate irregularity without flow-limiting stenosis; this can be seen in its mid to distal aspects. Fetal origin RIGHT PCA. 50-75% stenosis LEFT P2 PCA ambient segment. Moderate to severely diseased RIGHT PCA P3 segment and beyond. Mild irregularity of the M2 and M3 MCA segments consistent with intracranial atherosclerotic disease. IMPRESSION: Acute to subacute RIGHT mid pontine infarct. Correlate clinically as a cause for LEFT leg and LEFT facial weakness. Extensive chronic ischemia, throughout the cerebral hemispheres and brainstem, some foci of which show chronic hemorrhage. Atrophy and small vessel disease. No proximal intracranial flow reducing lesion. Mild to moderate basilar irregularity without flow-limiting stenosis. Electronically Signed   By: Staci Righter M.D.   On: 04/07/2018 20:56    Microbiology: No results found for this or any previous visit (from the past 240 hour(s)).   Labs: Basic Metabolic Panel: Recent Labs  Lab 04/07/18 1240 04/08/18 1028 04/09/18 0636  NA 138 134* 139  K 3.5 4.2 3.8  CL 104 102 107  CO2 '23 23 27  ' GLUCOSE 206* 264* 115*  BUN '18 21 16  ' CREATININE 1.21* 1.36* 1.10*  CALCIUM 9.2 8.7* 8.8*   Liver Function Tests: Recent Labs  Lab 04/07/18 1240  AST 21  ALT 17    ALKPHOS 80  BILITOT 0.6  PROT 8.1  ALBUMIN 3.5   No results for input(s): LIPASE, AMYLASE in the last 168 hours. No results for input(s): AMMONIA in the last 168 hours. CBC: Recent Labs  Lab 04/07/18 1240 04/08/18 1028 04/09/18 0636  WBC 12.2* 6.7 6.4  NEUTROABS 6.8  --   --   HGB 10.7* 9.7* 9.9*  HCT 33.4* 29.9* 30.0*  MCV 85.9 86.9 85.0  PLT 449* 361 366   Cardiac Enzymes: No results for input(s): CKTOTAL, CKMB, CKMBINDEX, TROPONINI in the last 168 hours. BNP: BNP (last 3 results) No results for input(s): BNP in the last 8760 hours.  ProBNP (last 3 results) No results for input(s): PROBNP in the last 8760 hours.  CBG: Recent Labs  Lab 04/09/18 0609 04/09/18 1106 04/09/18 1653 04/09/18 2135 04/10/18 0631  GLUCAP 110* 227* 153* 141* 127*       Signed:  Kayleen Memos, MD [facility]  04/10/2018, 10:48 AM

## 2018-04-10 NOTE — Consult Note (Signed)
[facility] CM Primary Care Navigator  04/10/2018  Lacresha Fusilier 1945/11/21 [id number]   Met withpatientat the bedside to identify possible discharge needs.Patient is getting ready for discharge at this time.   Patientreports that she felt "very tired and drained" when attending service at church where she "passed out" that had resulted to this admission. (acute to subacute right mid pontine infarct, CVA- cerebral vascular accident, TIA- transient ischemic attack)  PatientendorsesDr. Abelino Derrick with [place] [facility] as her primary care provider.    Patient shared using [facility] pharmacy on [address] to obtain medications without any problem.  Patient has beenmanaging her own medications at [facility] use of "pill box" system filled once a week.  Patientverbalized that her children have been providingtransportationto her doctors'appointments.  Patientstates that her niece Felicity Coyer) will be staying with her for few weeks after discharge. Her children will be there to assist if needed.  Anticipateddischargeplan ishome with home health services per therapy recommendation.   Patientvoiced understanding to call primary care provider's office when she returns home, for a post discharge follow-up within1- 2weeksor sooner if needs arise. Patient letter (with PCP's contact number) was provided asher reminder.   Noted order for EMMI Stroke callsalreadyin place to follow-up with her recovery. Patient was made aware of it.  Discussed with patient regarding [facility] CM services available for health management and resources at homeand she expressed interest about it. Patient was encouraged to discuss with primary care provider in her next visit, about further needsand assistancein managing her health conditions (mainly DM) and she agreed.  Patient expressed understandingto seek referral from primary care provider to [facility] care  management if deemed necessary and appropriate for services in the near future.   [facility] care management information was provided for future needs thatshe may have.   For additional questions please contact:  Edwena Felty A. Ogechi Kuehnel, BSN, RN-BC [facility] PRIMARY CARE Navigator Cell: [phone]

## 2018-04-10 NOTE — Progress Notes (Signed)
NURSING PROGRESS NOTE  Tara Conner [id number] Discharge Data: 04/10/2018 3:20 PM Attending Provider: Kayleen Memos, DO [id number], Mortimer Fries, MD     Tara Conner to be D/C'd Home per MD order.  Discussed with the patient the After Visit Summary and all questions fully answered. All IV's discontinued with no bleeding noted. All belongings returned to patient for patient to take home.   Last Vital Signs:  Blood pressure (!) 143/82, pulse 84, temperature 98.2 F (36.8 C), temperature source Oral, resp. rate 20, height 5' 6.5" (1.689 m), weight 90.7 kg, SpO2 97 %.  Discharge Medication List Allergies as of 04/10/2018      Reactions   Sulfa Antibiotics Other (See Comments)   Pt unable to report reaction      Medication List    STOP taking these medications   Dulaglutide 0.75 MG/0.5ML Sopn   lisinopril 20 MG tablet Commonly known as:  PRINIVIL,ZESTRIL   METAMUCIL FIBER PO     TAKE these medications   ACCU-CHEK AVIVA PLUS w/Device Kit Use to test blood sugars 2 times daily, insurance does not cover one touch anymore.   accu-chek soft touch lancets Use to check blood sugars two times daily.   amLODipine 5 MG tablet Commonly known as:  NORVASC Take 1 tablet (5 mg total) by mouth daily. Start taking on:  04/11/2018   aspirin EC 81 MG tablet Take 1 tablet (81 mg total) by mouth daily.   atorvastatin 40 MG tablet Commonly known as:  LIPITOR Take 1 tablet (40 mg total) by mouth daily.   clopidogrel 75 MG tablet Commonly known as:  PLAVIX Take 1 tablet (75 mg total) by mouth daily. Start taking on:  04/11/2018   ferrous sulfate 325 (65 FE) MG tablet Take 1 tablet (325 mg total) by mouth daily with breakfast. What changed:  how much to take   gabapentin 300 MG capsule Commonly known as:  NEURONTIN Take one at night for one week and then increase to one twice daily as tolerated. What changed:    how much to take  how to take this  when to take  this  reasons to take this  additional instructions   glucose blood test strip Use to test blood sugars two times daily.   insulin glargine 100 UNIT/ML injection Commonly known as:  LANTUS Inject 0.25 mLs (25 Units total) into the skin at bedtime. What changed:    how much to take  additional instructions   insulin lispro 100 UNIT/ML injection Commonly known as:  HUMALOG Inject 0-0.15 mLs (0-15 Units total) into the skin 3 (three) times daily with meals. Correction coverage: CBG<70- implement hypoglycemia protocol CBG 70-120; 0 units CBG 121-150: 2 Units CBG 151-200: 3 units CBG 201-250: 5 units CBG 251-300: 8 units CBG 301-350: 11 units CBG 351-400: 15 units CBG>400: Call MD   MULTIVITAMIN ADULT PO Take 1 tablet by mouth daily.   NEEDLE (DISP) 30 G 30G X 1" Misc To use with Lantus.   ranitidine 150 MG capsule Commonly known as:  ZANTAC Take 1 capsule (150 mg total) by mouth 2 (two) times daily. What changed:    when to take this  reasons to take this   REFRESH DRY EYE THERAPY OP Place 1-2 drops into both eyes 3 (three) times daily as needed (for dryness).            Durable Medical Equipment  (From admission, onward)         Start  Ordered   04/09/18 1224  For home use only DME 3 n 1  Once     04/09/18 1223   04/09/18 1224  For home use only DME Walker rolling  Once    Question:  Patient needs a walker to treat with the following condition  Answer:  Stroke ([name])   04/09/18 1223

## 2018-04-10 NOTE — Care Management Note (Signed)
Case Management Note  Patient Details  Name: Tara PaulsLillian Vanlanen MRN: [id number] Date of Birth: 03-02-1946  Subjective/Objective:                    Action/Plan: [facility]. CM provided choice and she selected [facility]. [facility]Don[facility] and [facility].  Walker and 3 in 1 at the bedside.  Pt has transportation home.   Expected Discharge Date:  04/10/18               Expected Discharge Plan:  Home w Home Health Services  In-House Referral:     Discharge planning Services  CM Consult  Post Acute Care Choice:  Durable Medical Equipment, Home Health Choice offered to:  Patient  DME Arranged:  3-N-1, Walker rolling DME Agency:  [facility]  HH Arranged:  PT, OT, [facility]  [facility]  Status of Service:  Completed, signed off  If discussed at Long Length of Stay Meetings, dates discussed:    Additional Comments:  Kermit BaloKelli F Kimaya Whitlatch, RN 04/10/2018, 11:50 AM

## 2018-04-10 NOTE — Progress Notes (Signed)
Physical Therapy Treatment Patient Details Name: Tara Conner MRN: [id number] DOB: [birth year] Today's Date: 04/10/2018    History of Present Illness Patient brought to ED s/p syncope 04/07/18. She had dizziness, nausea, and vomiting at time of episode. MRI +Rt pontine infarct and extensive chronic ischemia PMH-DM, neuropathy, HTN    PT Comments    Patient's tolerance to treatment today was good.  Patient was in bed with no family present upon PT arrival.  Patient ambulated to and from acute rehab gym occasionally requiring verbal cueing to look forward during ambulation.  Patient was able to ascend and descend stairs requiring increased time and min guard for safety.  Patient would continue to benefit from acute care PT in order to address mobility deficits.  Patient continues to be a good candidate for HHPT based on current functional status.    Follow Up Recommendations  Home health PT;Supervision/Assistance - 24 hour     Equipment Recommendations  Rolling walker with 5" wheels    Recommendations for Other Services       Precautions / Restrictions Precautions Precautions: Fall Restrictions Weight Bearing Restrictions: No    Mobility  Bed Mobility Overal bed mobility: Needs Assistance Bed Mobility: Supine to Sit     Supine to sit: Supervision     General bed mobility comments: pt required supervision for safety.  Transfers Overall transfer level: Needs assistance Equipment used: Rolling walker (2 wheeled);None Transfers: Sit to/from [employer] to Stand: Supervision Stand pivot transfers: Min guard       General transfer comment: Patient required min VC for correct hand placement on bed.  Pt required increased time to boost into standing and elevate trunk once standing in RW.  Patient required min guard for standing pivot transfer and backing to toilet for safety.  Patient was able to use bathroom railing to control descent to toilet.   Patient required min A to boost into standing from toilet due to lower surface.  Ambulation/Gait Ambulation/Gait assistance: Min guard;Supervision Gait Distance (Feet): 120 Feet Assistive device: Rolling walker (2 wheeled) Gait Pattern/deviations: Step-through pattern;Decreased stride length;Decreased step length - right;Decreased step length - left Gait velocity: Decreased   General Gait Details: Pt required VC to look forward during gait trial to and from acute rehab gym with noted gazing at floor occasionally.  Pt required min guard to supervision during ambulation for safety.  Therapist provided occasional VC for patient to maintain safe path of RW.   Stairs Stairs: Yes Stairs assistance: Min guard Stair Management: One rail Right;Step to pattern;Sideways Number of Stairs: 2 General stair comments: Pt required increased time to ascend and descend stairs.  Patient required min VC for safe hand placement on railing while turning at top of stairs.   Wheelchair Mobility    Modified Rankin (Stroke Patients Only) Modified Rankin (Stroke Patients Only) Pre-Morbid Rankin Score: Slight disability Modified Rankin: Moderately severe disability     Balance Overall balance assessment: Needs assistance Sitting-balance support: No upper extremity supported;Feet supported Sitting balance-Leahy Scale: Fair     Standing balance support: Bilateral upper extremity supported;During functional activity Standing balance-Leahy Scale: Fair Standing balance comment: pt reliant on UE support for dynamic mobility; able to static stand to wash hands with close supervision                            Cognition Arousal/Alertness: Awake/alert Behavior During Therapy: [facility] for tasks assessed/performed Overall Cognitive Status: Within Functional Limits for  tasks assessed Area of Impairment: Safety/judgement;Problem solving;Following commands                       Following  Commands: Follows one step commands consistently;Follows one step commands with increased time Safety/Judgement: Decreased awareness of safety   Problem Solving: Slow processing;Difficulty sequencing;Requires verbal cues General Comments: pt continues to require increased time for following multi-step instructions.      Exercises      General Comments        Pertinent Vitals/Pain Pain Assessment: No/denies pain    Home Living                      Prior Function            PT Goals (current goals can now be found in the care plan section) Acute Rehab PT Goals Patient Stated Goal: none stated PT Goal Formulation: With patient Time For Goal Achievement: 04/15/18 Potential to Achieve Goals: Good Progress towards PT goals: Progressing toward goals    Frequency    Min 4X/week      PT Plan Current plan remains appropriate    Co-evaluation              AM-PAC PT "6 Clicks" Daily Activity  Outcome Measure  Difficulty turning over in bed (including adjusting bedclothes, sheets and blankets)?: A Little Difficulty moving from lying on back to sitting on the side of the bed? : A Little Difficulty sitting down on and standing up from a chair with arms (e.g., wheelchair, bedside commode, etc,.)?: A Little Help needed moving to and from a bed to chair (including a wheelchair)?: A Little Help needed walking in hospital room?: A Little Help needed climbing 3-5 steps with a railing? : A Little 6 Click Score: 18    End of Session Equipment Utilized During Treatment: Gait belt Activity Tolerance: Patient tolerated treatment well Patient left: in chair;with call bell/phone within reach Nurse Communication: Mobility status PT Visit Diagnosis: Other abnormalities of gait and mobility (R26.89);Other symptoms and signs involving the nervous system (R29.898)     Time: 1206-1227 PT Time Calculation (min) (ACUTE ONLY): 21 min  Charges:  $Gait Training: 8-22  mins                     [address], SPTA   Hadas Jessop 04/10/2018, 3:28 PM

## 2018-04-11 ENCOUNTER — Encounter: Payer: Self-pay | Admitting: Family Medicine

## 2018-04-12 ENCOUNTER — Ambulatory Visit: Payer: Self-pay | Admitting: *Deleted

## 2018-04-12 ENCOUNTER — Telehealth: Payer: Self-pay | Admitting: Behavioral Health

## 2018-04-12 NOTE — Telephone Encounter (Signed)
Transition Care Management Follow-up Telephone Call  PCP: Mliss SaxKremer, William Alfred, MD  Admit date: 04/07/2018 Discharge date: 04/10/2018   Recommendations for Outpatient Follow-up:  1. Follow up with neurology 2. Follow up with PCP 3. Take your medications as prescribed 4. Continue PT 5. Fall precautions    How have you been since you were released from the hospital? Patient stated, "that she's feeling about the same as she did before going into the hospital."   Do you understand why you were in the hospital? yes, patient voiced that she had a stroke.   Do you understand the discharge instructions? yes   Where were you discharged to? Home   Items Reviewed:  Medications reviewed: yes  Allergies reviewed: yes  Dietary changes reviewed: yes, low carb diet  Referrals reviewed: yes, Follow up with PCP; Follow up with neurology   Functional Questionnaire:   Activities of Daily Living (ADLs):   She states they are independent in the following: ambulation, bathing and hygiene, feeding, continence, grooming, toileting and dressing States they require assistance with the following: None   Any transportation issues/concerns?: no   Any patient concerns? yes, patient voiced that she has not had a bowel movement in 4 days now, but had been advised in the hospital to get a stool softener once she was home.   Confirmed importance and date/time of follow-up visits scheduled yes, 04/16/18 at 11:30 AM.  Provider Appointment booked with Dr. Doreene BurkeKremer.  Confirmed with patient if condition begins to worsen call PCP or go to the ER.  Patient was given the office number and encouraged to call back with question or concerns.  : yes

## 2018-04-12 NOTE — Telephone Encounter (Signed)
Pt reports LBM 04/09/18. States normal pattern QOD except when taking iron, which is she currently taking, then every 3rd day. Denies any nausea, vomiting abdominal pain or distention.  States she was taking metamucil when needed which was effective. Pt is concerned because during recent hospitalization MD told her not to take metamucil. Home care advise given; pt questioning what OTC medication would be appropriate to take with new medications she is on since hospital admit.  Requesting CB Please advise: [phone]  Reason for Disposition . Mild constipation  Answer Assessment - Initial Assessment Questions 1. STOOL PATTERN OR FREQUENCY: "How often do you pass bowel movements (BMs)?"  (Normal range: tid to q 3 days)  "When was the last BM passed?"       Every other day, with iron every 3 days 2. STRAINING: "Do you have to strain to have a BM?"      No  But has urge 3. RECTAL PAIN: "Does your rectum hurt when the stool comes out?" If so, ask: "Do you have hemorrhoids? How bad is the pain?"  (Scale 1-10; or mild, moderate, severe)     no 4. STOOL COMPOSITION: "Are the stools hard?"      formed 5. BLOOD ON STOOLS: "Has there been any blood on the toilet tissue or on the surface of the BM?" If so, ask: "When was the last time?"      no 6. CHRONIC CONSTIPATION: "Is this a new problem for you?"  If no, ask: "How long have you had this problem?" (days, weeks, months)      No, happens when I take iron 7. CHANGES IN DIET: "Have there been any recent changes in your diet?"      Healthy diet, DM diet since in hospital 8. MEDICATIONS: "Have you been taking any new medications?"     yes 9. LAXATIVES: "Have you been using any laxatives or enemas?"  If yes, ask "What, how often, and when was the last time?"     Metamucil before she went to hospital, MD told her to stop  10. CAUSE: "What do you think is causing the constipation?"        Iron 11. OTHER SYMPTOMS: "Do you have any other symptoms?" (e.g.,  abdominal pain, fever, vomiting)       "Feel full"  Protocols used: CONSTIPATION-A-AH

## 2018-04-13 NOTE — Telephone Encounter (Signed)
Okay to take metamucil

## 2018-04-13 NOTE — Telephone Encounter (Signed)
Informed patient as instructed-verbalized understanding.

## 2018-04-16 ENCOUNTER — Encounter: Payer: Self-pay | Admitting: Family Medicine

## 2018-04-16 ENCOUNTER — Ambulatory Visit (INDEPENDENT_AMBULATORY_CARE_PROVIDER_SITE_OTHER): Payer: Medicare Other | Admitting: Family Medicine

## 2018-04-16 VITALS — BP 124/80 | HR 64 | Temp 97.7°F | Ht 66.5 in | Wt 202.0 lb

## 2018-04-16 DIAGNOSIS — I639 Cerebral infarction, unspecified: Secondary | ICD-10-CM

## 2018-04-16 DIAGNOSIS — E1140 Type 2 diabetes mellitus with diabetic neuropathy, unspecified: Secondary | ICD-10-CM

## 2018-04-16 DIAGNOSIS — Z09 Encounter for follow-up examination after completed treatment for conditions other than malignant neoplasm: Secondary | ICD-10-CM | POA: Insufficient documentation

## 2018-04-16 DIAGNOSIS — I10 Essential (primary) hypertension: Secondary | ICD-10-CM | POA: Diagnosis not present

## 2018-04-16 DIAGNOSIS — Z794 Long term (current) use of insulin: Secondary | ICD-10-CM

## 2018-04-16 DIAGNOSIS — D509 Iron deficiency anemia, unspecified: Secondary | ICD-10-CM

## 2018-04-16 MED ORDER — INSULIN LISPRO 100 UNIT/ML ~~LOC~~ SOLN: 0.0000 [IU] | Freq: Three times a day (TID) | SUBCUTANEOUS | 4 refills | Status: DC

## 2018-04-16 NOTE — Progress Notes (Signed)
Subjective:  Patient ID: Tara Conner, female    DOB: 1945-08-13  Age: 72 y.o. MRN: [id number]  CC: Hospitalization Follow-up   HPI Vasiliki Smaldone presents for follow-up status post hospitalization for acute/chronic CVA involving her right pontine pons.  She has been left with some left-sided weakness.  Currently ambulating with a walker.  While hospitalized patient was switched to lispro regular insulin on a sliding scale to be checked before each meal.  She has not filled the Trulicity.  She was also started on Plavix 75 mg daily to be taken with her aspirin.  Follow-up with neurology is on 1215.  She is back at home.  Fortunately neuro imaging studies did show mild stenosis involving the arteries to and around her brain but there was evidence of chronic small vessel ischemic change.  Outpatient Medications Prior to Visit  Medication Sig Dispense Refill  . amLODipine (NORVASC) 5 MG tablet Take 1 tablet (5 mg total) by mouth daily. 30 tablet 0  . aspirin EC 81 MG tablet Take 1 tablet (81 mg total) by mouth daily. 30 tablet 0  . atorvastatin (LIPITOR) 40 MG tablet Take 1 tablet (40 mg total) by mouth daily. 30 tablet 0  . Blood Glucose Monitoring Suppl (ACCU-CHEK AVIVA PLUS) w/Device KIT Use to test blood sugars 2 times daily, insurance does not cover one touch anymore. 1 kit 0  . clopidogrel (PLAVIX) 75 MG tablet Take 1 tablet (75 mg total) by mouth daily. 30 tablet 0  . ferrous sulfate 325 (65 FE) MG tablet Take 1 tablet (325 mg total) by mouth daily with breakfast. (Patient taking differently: Take 325-650 mg by mouth daily with breakfast. ) 90 tablet 1  . gabapentin (NEURONTIN) 300 MG capsule Take one at night for one week and then increase to one twice daily as tolerated. (Patient taking differently: Take 300 mg by mouth at bedtime as needed (for neuropathy). ) 60 capsule 3  . glucose blood (ACCU-CHEK AVIVA PLUS) test strip Use to test blood sugars two times daily. 100 each 5  .  Glycerin-Polysorbate 80 (REFRESH DRY EYE THERAPY OP) Place 1-2 drops into both eyes 3 (three) times daily as needed (for dryness).    . insulin glargine (LANTUS) 100 UNIT/ML injection Inject 0.25 mLs (25 Units total) into the skin at bedtime. 30 mL 0  . Lancets (ACCU-CHEK SOFT TOUCH) lancets Use to check blood sugars two times daily. 100 each 5  . Multiple Vitamins-Minerals (MULTIVITAMIN ADULT PO) Take 1 tablet by mouth daily.    Marland Kitchen NEEDLE, DISP, 30 G (B-D DISP NEEDLE 30GX1") 30G X 1" MISC To use with Lantus. 100 each 3  . ranitidine (ZANTAC) 150 MG capsule Take 1 capsule (150 mg total) by mouth 2 (two) times daily. (Patient taking differently: Take 150 mg by mouth 2 (two) times daily as needed for heartburn. ) 180 capsule 1  . insulin lispro (HUMALOG) 100 UNIT/ML injection Inject 0-0.15 mLs (0-15 Units total) into the skin 3 (three) times daily with meals. Correction coverage: CBG<70- implement hypoglycemia protocol CBG 70-120; 0 units CBG 121-150: 2 Units CBG 151-200: 3 units CBG 201-250: 5 units CBG 251-300: 8 units CBG 301-350: 11 units CBG 351-400: 15 units CBG>400: Call MD 30 mL 0   No facility-administered medications prior to visit.     ROS Review of Systems  Constitutional: Negative.   Eyes: Negative for photophobia.  Respiratory: Negative.   Cardiovascular: Negative.   Gastrointestinal: Negative.   Endocrine: Negative for polyphagia and polyuria.  Genitourinary: Negative.   Musculoskeletal: Positive for gait problem. Negative for joint swelling.  Skin: Negative for pallor.  Neurological: Positive for weakness and numbness. Negative for headaches.  Hematological: Does not bruise/bleed easily.  Psychiatric/Behavioral: Negative.     Objective:  BP 124/80 (BP Location: Right Arm, Patient Position: Sitting, Cuff Size: Normal)   Pulse 64   Temp 97.7 F (36.5 C) (Oral)   Ht 5' 6.5" (1.689 m)   Wt 202 lb (91.6 kg)   LMP  (LMP Unknown)   SpO2 99%   BMI 32.12 kg/m   BP  Readings from Last 3 Encounters:  04/16/18 124/80  04/10/18 (!) 143/82  03/27/18 (!) 183/85    Wt Readings from Last 3 Encounters:  04/16/18 202 lb (91.6 kg)  04/07/18 200 lb (90.7 kg)  03/20/18 204 lb 6 oz (92.7 kg)    Physical Exam  Constitutional: She is oriented to person, place, and time. She appears well-developed and well-nourished. No distress.  HENT:  Head: Normocephalic and atraumatic.  Right Ear: External ear normal.  Left Ear: External ear normal.  Mouth/Throat: Oropharynx is clear and moist.  Eyes: Pupils are equal, round, and reactive to light. Conjunctivae and EOM are normal. Right eye exhibits no discharge. Left eye exhibits no discharge. No scleral icterus.  Neck: No tracheal deviation present.  Cardiovascular: Normal rate, regular rhythm and normal heart sounds.  Pulmonary/Chest: Effort normal.  Neurological: She is alert and oriented to person, place, and time.  4/5 left sided grip strength.  5/5 strength in in LLE.   Skin: Skin is warm and dry.  Psychiatric: She has a normal mood and affect. Her behavior is normal.    Lab Results  Component Value Date   WBC 6.4 04/09/2018   HGB 9.9 (L) 04/09/2018   HCT 30.0 (L) 04/09/2018   PLT 366 04/09/2018   GLUCOSE 115 (H) 04/09/2018   CHOL 120 04/08/2018   TRIG 109 04/08/2018   HDL 32 (L) 04/08/2018   LDLDIRECT 80.0 01/30/2018   LDLCALC 66 04/08/2018   ALT 17 04/07/2018   AST 21 04/07/2018   NA 139 04/09/2018   K 3.8 04/09/2018   CL 107 04/09/2018   CREATININE 1.10 (H) 04/09/2018   BUN 16 04/09/2018   CO2 27 04/09/2018   HGBA1C 8.3 (H) 04/08/2018   MICROALBUR 4.0 (H) 01/30/2018    Ct Angio Head W Or Wo Contrast  Result Date: 04/07/2018 CLINICAL DATA:  Syncopal episode at church earlier today. LEFT-sided weakness. EXAM: CT ANGIOGRAPHY HEAD AND NECK TECHNIQUE: Multidetector CT imaging of the head and neck was performed using the standard protocol during bolus administration of intravenous contrast.  Multiplanar CT image reconstructions and MIPs were obtained to evaluate the vascular anatomy. Carotid stenosis measurements (when applicable) are obtained utilizing NASCET criteria, using the distal internal carotid diameter as the denominator. CONTRAST:  161m ISOVUE-370 IOPAMIDOL (ISOVUE-370) INJECTION 76% COMPARISON:  CT head earlier today. FINDINGS: CTA NECK FINDINGS Aortic arch: Standard branching. Imaged portion shows no evidence of aneurysm or dissection. No significant stenosis of the major arch vessel origins. Moderate calcific atheromatous change of the proximal RIGHT subclavian. Right carotid system: No evidence of dissection, stenosis (50% or greater) or occlusion. Mild to moderate calcific plaque extends into the RIGHT ICA, non flow reducing. Left carotid system: No evidence of dissection, stenosis (50% or greater) or occlusion. Mild to moderate calcific plaque extends into the LEFT ICA, non flow reducing. Vertebral arteries: Dominant LEFT vertebral.  Both are patent. Skeleton: No worrisome  osseous lesion. Other neck: Mild thyromegaly.  No adenopathy or mass. Upper chest: Lung apices are clear. Review of the MIP images confirms the above findings CTA HEAD FINDINGS Anterior circulation: Nonstenotic atheromatous calcification of the cavernous carotid arteries bilaterally. ICA termini widely patent. No ACA or MCA stenosis or occlusion. Posterior circulation: LEFT vertebral is the dominant/sole contributor to the basilar. 50% stenosis, LEFT V4 segment. RIGHT vertebral ends in PICA. There is nonstenotic irregularity of the distal basilar, see for instance 8:109, 9:93. Fetal origin RIGHT PCA. LEFT P2 PCA stenosis in its ambient segment, 50-75%. Venous sinuses: As permitted by contrast timing, patent. Anatomic variants: Fetal RIGHT PCA. Delayed phase: No abnormal postcontrast enhancement. Review of the MIP images confirms the above findings IMPRESSION: Nonstenotic extracranial atheromatous change, without flow  reducing carotid or vertebral stenosis. Mild irregularity of the mid to distal basilar, without flow reducing stenosis. No abnormal postcontrast enhancement. Electronically Signed   By: Staci Righter M.D.   On: 04/07/2018 20:17   Dg Chest 2 View  Result Date: 04/07/2018 CLINICAL DATA:  TIA, syncope. EXAM: CHEST - 2 VIEW COMPARISON:  None. FINDINGS: Heart size is upper normal. Lungs are clear. No pleural effusion or pneumothorax seen. No acute or suspicious osseous finding. IMPRESSION: No active cardiopulmonary disease. Electronically Signed   By: Franki Cabot M.D.   On: 04/07/2018 19:08   Ct Head Wo Contrast  Result Date: 04/07/2018 CLINICAL DATA:  Patient with syncopal episode. EXAM: CT HEAD WITHOUT CONTRAST TECHNIQUE: Contiguous axial images were obtained from the base of the skull through the vertex without intravenous contrast. COMPARISON:  None. FINDINGS: Brain: Ventricles and sulci are prominent compatible with atrophy. Periventricular and subcortical white matter hypodensity compatible with chronic microvascular ischemic changes. Old right basal ganglia lacunar infarct. No evidence for acute cortically based infarct, intracranial hemorrhage, mass lesion or mass-effect. Vascular: Unremarkable. Skull: Intact. Sinuses/Orbits: Paranasal sinuses are well aerated. Mastoid air cells are unremarkable. Orbits are unremarkable. Other: None. IMPRESSION: No acute intracranial process. Atrophy and chronic microvascular ischemic changes. Electronically Signed   By: Lovey Newcomer M.D.   On: 04/07/2018 14:16   Ct Angio Neck W Or Wo Contrast  Result Date: 04/07/2018 CLINICAL DATA:  Syncopal episode at church earlier today. LEFT-sided weakness. EXAM: CT ANGIOGRAPHY HEAD AND NECK TECHNIQUE: Multidetector CT imaging of the head and neck was performed using the standard protocol during bolus administration of intravenous contrast. Multiplanar CT image reconstructions and MIPs were obtained to evaluate the vascular  anatomy. Carotid stenosis measurements (when applicable) are obtained utilizing NASCET criteria, using the distal internal carotid diameter as the denominator. CONTRAST:  16m ISOVUE-370 IOPAMIDOL (ISOVUE-370) INJECTION 76% COMPARISON:  CT head earlier today. FINDINGS: CTA NECK FINDINGS Aortic arch: Standard branching. Imaged portion shows no evidence of aneurysm or dissection. No significant stenosis of the major arch vessel origins. Moderate calcific atheromatous change of the proximal RIGHT subclavian. Right carotid system: No evidence of dissection, stenosis (50% or greater) or occlusion. Mild to moderate calcific plaque extends into the RIGHT ICA, non flow reducing. Left carotid system: No evidence of dissection, stenosis (50% or greater) or occlusion. Mild to moderate calcific plaque extends into the LEFT ICA, non flow reducing. Vertebral arteries: Dominant LEFT vertebral.  Both are patent. Skeleton: No worrisome osseous lesion. Other neck: Mild thyromegaly.  No adenopathy or mass. Upper chest: Lung apices are clear. Review of the MIP images confirms the above findings CTA HEAD FINDINGS Anterior circulation: Nonstenotic atheromatous calcification of the cavernous carotid arteries bilaterally. ICA termini  widely patent. No ACA or MCA stenosis or occlusion. Posterior circulation: LEFT vertebral is the dominant/sole contributor to the basilar. 50% stenosis, LEFT V4 segment. RIGHT vertebral ends in PICA. There is nonstenotic irregularity of the distal basilar, see for instance 8:109, 9:93. Fetal origin RIGHT PCA. LEFT P2 PCA stenosis in its ambient segment, 50-75%. Venous sinuses: As permitted by contrast timing, patent. Anatomic variants: Fetal RIGHT PCA. Delayed phase: No abnormal postcontrast enhancement. Review of the MIP images confirms the above findings IMPRESSION: Nonstenotic extracranial atheromatous change, without flow reducing carotid or vertebral stenosis. Mild irregularity of the mid to distal  basilar, without flow reducing stenosis. No abnormal postcontrast enhancement. Electronically Signed   By: Staci Righter M.D.   On: 04/07/2018 20:17   Mr Brain Wo Contrast  Result Date: 04/07/2018 CLINICAL DATA:  Malaise for 3-4 weeks after receiving a high-dose flu shot. Recurring headache and dizziness. Syncopal episode today, now with LEFT-sided weakness. EXAM: MRI HEAD WITHOUT CONTRAST MRA HEAD WITHOUT CONTRAST TECHNIQUE: Multiplanar, multiecho pulse sequences of the brain and surrounding structures were obtained without intravenous contrast. Angiographic images of the head were obtained using MRA technique without contrast. COMPARISON:  CTA head neck reported separately. FINDINGS: MRI HEAD FINDINGS Brain: In the RIGHT paramedian pons, there is a heterogeneous 6 x 11 mm region of restricted and facilitated diffusion, more restricted medially, low to normal ADC, and correlating with abnormal T2 and FLAIR hyperintensity, but not frank lacunar change, consistent with an acute/subacute brainstem infarct. Immediately adjacent, and slightly more cephalad on the LEFT, there is a large chronic LEFT paramedian pons lacunar infarct. Generalized atrophy. T2 and FLAIR hyperintensities throughout the white matter, moderately advanced, consistent with small vessel disease. In addition to the brainstem ischemic change, BILATERAL basal ganglia and deep white matter lacunar infarcts are observed. Punctate foci of chronic hemorrhage affects the LEFT parietal cortex and subcortical white matter, which could represent a remote ischemic or traumatic insult. The LEFT upper pontine lacune is hemorrhagic as well. Vascular: Reported separately. Skull and upper cervical spine: Normal marrow signal. No upper cervical lesions. Sinuses/Orbits: Chronic maxillary sinus disease. No layering fluid. Negative orbits. Other: None. MRA HEAD FINDINGS The internal carotid arteries are widely patent. The basilar artery is supplied solely by the  LEFT vertebral, which is mildly irregular in its V4 segment. The basilar demonstrates mild to moderate irregularity without flow-limiting stenosis; this can be seen in its mid to distal aspects. Fetal origin RIGHT PCA. 50-75% stenosis LEFT P2 PCA ambient segment. Moderate to severely diseased RIGHT PCA P3 segment and beyond. Mild irregularity of the M2 and M3 MCA segments consistent with intracranial atherosclerotic disease. IMPRESSION: Acute to subacute RIGHT mid pontine infarct. Correlate clinically as a cause for LEFT leg and LEFT facial weakness. Extensive chronic ischemia, throughout the cerebral hemispheres and brainstem, some foci of which show chronic hemorrhage. Atrophy and small vessel disease. No proximal intracranial flow reducing lesion. Mild to moderate basilar irregularity without flow-limiting stenosis. Electronically Signed   By: Staci Righter M.D.   On: 04/07/2018 20:56   Mr Cervical Spine Wo Contrast  Result Date: 04/07/2018 CLINICAL DATA:  Three-week history syncope, nausea, vomiting and headaches and neck pain. EXAM: MRI CERVICAL SPINE WITHOUT CONTRAST TECHNIQUE: Multiplanar, multisequence MR imaging of the cervical spine was performed. No intravenous contrast was administered. COMPARISON:  Neck CT 04/07/2018 FINDINGS: Alignment: Normal Vertebrae: Endplate reactive changes but no bone lesions or fractures. Cord: Moderate artifact through the cord but no definite cord lesions or syrinx. Posterior Fossa,  vertebral arteries, paraspinal tissues: Evidence of white matter disease and or prior brainstem infarcts. Moderate cerebellar atrophy. Moderate degenerative changes at C1-2 but no significant pannus formation or mass effect on the upper cervical cord. Disc levels: C2-3: No significant findings. C3-4: Very shallow central disc protrusion with minimal impression on the ventral thecal sac. No spinal or foraminal stenosis. C4-5: No significant findings. C5-6: Moderate disc disease and facet  disease. Shallow central disc protrusion with focal mass effect on the ventral thecal sac and slight narrowing the ventral CSF space. No foraminal stenosis. C6-7: No significant findings. C7-T1: Shallow central disc protrusion with mild focal impression on the ventral thecal sac. No foraminal stenosis. IMPRESSION: 1. Shallow central disc protrusions at C3-4, C5-6 and C7-T1 without significant neural compression. 2. No findings for foraminal stenosis. Electronically Signed   By: Marijo Sanes M.D.   On: 04/07/2018 20:58   Mr Jodene Nam Head Wo Contrast  Result Date: 04/07/2018 CLINICAL DATA:  Malaise for 3-4 weeks after receiving a high-dose flu shot. Recurring headache and dizziness. Syncopal episode today, now with LEFT-sided weakness. EXAM: MRI HEAD WITHOUT CONTRAST MRA HEAD WITHOUT CONTRAST TECHNIQUE: Multiplanar, multiecho pulse sequences of the brain and surrounding structures were obtained without intravenous contrast. Angiographic images of the head were obtained using MRA technique without contrast. COMPARISON:  CTA head neck reported separately. FINDINGS: MRI HEAD FINDINGS Brain: In the RIGHT paramedian pons, there is a heterogeneous 6 x 11 mm region of restricted and facilitated diffusion, more restricted medially, low to normal ADC, and correlating with abnormal T2 and FLAIR hyperintensity, but not frank lacunar change, consistent with an acute/subacute brainstem infarct. Immediately adjacent, and slightly more cephalad on the LEFT, there is a large chronic LEFT paramedian pons lacunar infarct. Generalized atrophy. T2 and FLAIR hyperintensities throughout the white matter, moderately advanced, consistent with small vessel disease. In addition to the brainstem ischemic change, BILATERAL basal ganglia and deep white matter lacunar infarcts are observed. Punctate foci of chronic hemorrhage affects the LEFT parietal cortex and subcortical white matter, which could represent a remote ischemic or traumatic insult.  The LEFT upper pontine lacune is hemorrhagic as well. Vascular: Reported separately. Skull and upper cervical spine: Normal marrow signal. No upper cervical lesions. Sinuses/Orbits: Chronic maxillary sinus disease. No layering fluid. Negative orbits. Other: None. MRA HEAD FINDINGS The internal carotid arteries are widely patent. The basilar artery is supplied solely by the LEFT vertebral, which is mildly irregular in its V4 segment. The basilar demonstrates mild to moderate irregularity without flow-limiting stenosis; this can be seen in its mid to distal aspects. Fetal origin RIGHT PCA. 50-75% stenosis LEFT P2 PCA ambient segment. Moderate to severely diseased RIGHT PCA P3 segment and beyond. Mild irregularity of the M2 and M3 MCA segments consistent with intracranial atherosclerotic disease. IMPRESSION: Acute to subacute RIGHT mid pontine infarct. Correlate clinically as a cause for LEFT leg and LEFT facial weakness. Extensive chronic ischemia, throughout the cerebral hemispheres and brainstem, some foci of which show chronic hemorrhage. Atrophy and small vessel disease. No proximal intracranial flow reducing lesion. Mild to moderate basilar irregularity without flow-limiting stenosis. Electronically Signed   By: Staci Righter M.D.   On: 04/07/2018 20:56    Assessment & Plan:   Hagan was seen today for hospitalization follow-up.  Diagnoses and all orders for this visit:  Cerebrovascular accident (CVA), unspecified mechanism ([place])  Essential hypertension  Type 2 diabetes mellitus with diabetic neuropathy, with long-term current use of insulin ([place]) -     insulin  lispro (HUMALOG) 100 UNIT/ML injection; Inject 0-0.15 mLs (0-15 Units total) into the skin 3 (three) times daily with meals. Correction coverage: CBG<70- implement hypoglycemia protocol CBG 70-120; 0 units CBG 121-150: 2 Units CBG 151-200: 3 units CBG 201-250: 5 units CBG 251-300: 8 units CBG 301-350: 11 units CBG 351-400: 15  units CBG>400: Call MD  Iron deficiency anemia, unspecified iron deficiency anemia type  Hospital discharge follow-up   I am having [name] maintain her gabapentin, ranitidine, ACCU-CHEK AVIVA PLUS, glucose blood, ferrous sulfate, accu-chek soft touch, Multiple Vitamins-Minerals (MULTIVITAMIN ADULT PO), NEEDLE (DISP) 30 G, Glycerin-Polysorbate 80 (REFRESH DRY EYE THERAPY OP), aspirin EC, atorvastatin, clopidogrel, insulin glargine, amLODipine, and insulin lispro.  Meds ordered this encounter  Medications  . insulin lispro (HUMALOG) 100 UNIT/ML injection    Sig: Inject 0-0.15 mLs (0-15 Units total) into the skin 3 (three) times daily with meals. Correction coverage: CBG<70- implement hypoglycemia protocol CBG 70-120; 0 units CBG 121-150: 2 Units CBG 151-200: 3 units CBG 201-250: 5 units CBG 251-300: 8 units CBG 301-350: 11 units CBG 351-400: 15 units CBG>400: Call MD    Dispense:  30 mL    Refill:  4   Advised patient not to fill Trulicity.  We will use preprandial lispro as given by hospitalist.  Patient is comfortable with this regimen.  Continue all medicines as above including Metamucil.  She will use MiraLAX if the Metamucil becomes ineffective.  Continue iron therapy daily.  Follow-up with with neurology on 12/15.  Stressed the importance of taking her Lipitor, Plavix and aspirin.  Follow-up: Return in about 2 months (around 06/16/2018).  Libby Maw, MD

## 2018-04-17 ENCOUNTER — Telehealth: Payer: Self-pay | Admitting: Family Medicine

## 2018-04-17 NOTE — Telephone Encounter (Signed)
Copied from CRM [id number]. Topic: Quick Communication - Home Health Verbal Orders >> Apr 17, 2018  2:03 PM Crist InfanteHarrald, Kathy J wrote: Caller/Agency: [facility]Amber Callback Number: [phone] [facility] for home health PT 2 wk 3 1 wk 1

## 2018-04-17 NOTE — Telephone Encounter (Signed)
Okay for verbal order

## 2018-04-17 NOTE — Telephone Encounter (Signed)
Okay and thanks.  

## 2018-04-17 NOTE — Telephone Encounter (Signed)
Verbal orders given  

## 2018-04-18 ENCOUNTER — Telehealth: Payer: Self-pay

## 2018-04-18 ENCOUNTER — Telehealth: Payer: Self-pay | Admitting: Family Medicine

## 2018-04-18 NOTE — Telephone Encounter (Signed)
Okay 

## 2018-04-18 NOTE — Telephone Encounter (Signed)
Verbal given 

## 2018-04-18 NOTE — Telephone Encounter (Signed)
Okay for verbal 

## 2018-04-18 NOTE — Telephone Encounter (Signed)
Copied from CRM [id number]. Topic: Quick Communication - Home Health Verbal Orders >> Apr 18, 2018  3:43 PM Lynne LoganHudson, Caryn D wrote: Caller/Agency: [facility] / Starr SinclairRitu Callback Number: [phone] Requesting OT/PT/Skilled Nursing/Social Work: OT Frequency:  2 week 2 1 week 2

## 2018-04-18 NOTE — Telephone Encounter (Signed)
Copied from CRM [id number]. Topic: General - Other >> Apr 18, 2018 10:18 AM Gerrianne ScalePayne, Sol Englert L wrote: Reason for CRM: PT  assistant Princess from [facility]  [phone] calling for verbal nursing orders for 1 week 3 and PRN visits asap

## 2018-04-19 NOTE — Telephone Encounter (Signed)
Verbal order given  

## 2018-04-19 NOTE — Telephone Encounter (Signed)
Okay 

## 2018-04-25 ENCOUNTER — Telehealth: Payer: Self-pay

## 2018-04-25 NOTE — Telephone Encounter (Signed)
I really can't bless this trip with her recent stroke. I will leave it up to her.

## 2018-04-25 NOTE — Telephone Encounter (Signed)
Copied from CRM [id number]. Topic: General - Other >> Apr 25, 2018  9:29 AM Gean BirchwoodWilliams-Neal, Sade R wrote: Patient wants to know if its ok for her to get on a plane and go see her kids for christmas. She is wanting a call back from a nurse or Dr Doreene BurkeKremer

## 2018-04-30 NOTE — Telephone Encounter (Signed)
I called and spoke with patient. We went over the message below and she verbalized understanding. The plane ticket has been bought and she will be going on the 17th. Patient is aware that she has an appointment with us on 12/12 and stated that she is doing better.

## 2018-05-03 ENCOUNTER — Ambulatory Visit: Payer: Medicare HMO | Admitting: Family Medicine

## 2018-05-03 ENCOUNTER — Other Ambulatory Visit: Payer: Self-pay | Admitting: Family Medicine

## 2018-05-03 DIAGNOSIS — E1140 Type 2 diabetes mellitus with diabetic neuropathy, unspecified: Secondary | ICD-10-CM

## 2018-05-03 DIAGNOSIS — Z794 Long term (current) use of insulin: Principal | ICD-10-CM

## 2018-05-03 MED ORDER — GLUCOSE BLOOD VI STRP: ORAL_STRIP | 5 refills | Status: DC

## 2018-05-03 MED ORDER — INSULIN LISPRO 100 UNIT/ML ~~LOC~~ SOLN: 0.0000 [IU] | Freq: Three times a day (TID) | SUBCUTANEOUS | 4 refills | Status: DC

## 2018-05-03 NOTE — Telephone Encounter (Signed)
Copied from CRM [id number]. Topic: Quick Communication - Rx Refill/Question >> May 03, 2018  2:54 PM Floria RavelingStovall, Shana A wrote: Medication: insulin lispro (HUMALOG) 100 UNIT/ML injection [[id number]][[id number]] and the test strips  Has the patient contacted their pharmacy? No.- Pharmacy needs new script.  Pt rec this in the hosp.  Pt had Hosp fu with pcp on 11/18 (Agent: If no, request that the patient contact the pharmacy for the refill.) (Agent: If yes, when and what did the pharmacy advise?)  Preferred Pharmacy (with phone number or street name): [id number] - [place], [place] - [address] AT [address] [phone] (Phone)   Agent: Please be advised that RX refills may take up to 3 business days. We ask that you follow-up with your pharmacy.

## 2018-05-07 ENCOUNTER — Other Ambulatory Visit: Payer: Self-pay | Admitting: Family Medicine

## 2018-05-07 ENCOUNTER — Other Ambulatory Visit: Payer: Self-pay

## 2018-05-07 DIAGNOSIS — E1140 Type 2 diabetes mellitus with diabetic neuropathy, unspecified: Secondary | ICD-10-CM

## 2018-05-07 DIAGNOSIS — Z794 Long term (current) use of insulin: Principal | ICD-10-CM

## 2018-05-07 DIAGNOSIS — E7800 Pure hypercholesterolemia, unspecified: Secondary | ICD-10-CM

## 2018-05-07 MED ORDER — INSULIN LISPRO 100 UNIT/ML ~~LOC~~ SOLN: 0.0000 [IU] | Freq: Three times a day (TID) | SUBCUTANEOUS | 4 refills | Status: DC

## 2018-05-07 NOTE — Telephone Encounter (Signed)
Copied from CRM [phone]. Topic: General - Other >> May 07, 2018  2:14 PM Ronney LionArrington, Shykila A wrote: Medication:  Lancets (ACCU-CHEK SOFT TOUCH) lancets, clopidogrel (PLAVIX) 75 MG tablet, atorvastatin (LIPITOR) 40 MG tablet, amLODipine (NORVASC) 5 MG tablet  Has the patient contacted their pharmacy? Yes  Preferred Pharmacy (with phone number or street name): [id number] - [place], [place] - [address] AT [address]  [phone] (Phone) [phone] (Fax)    Agent: Please be advised that RX refills may take up to 3 business days. We ask that you follow-up with your pharmacy.

## 2018-05-08 ENCOUNTER — Other Ambulatory Visit: Payer: Self-pay | Admitting: Family Medicine

## 2018-05-08 ENCOUNTER — Other Ambulatory Visit: Payer: Self-pay

## 2018-05-08 DIAGNOSIS — E7800 Pure hypercholesterolemia, unspecified: Secondary | ICD-10-CM

## 2018-05-08 MED ORDER — CLOPIDOGREL BISULFATE 75 MG PO TABS: 75.0000 mg | ORAL_TABLET | Freq: Every day | ORAL | 1 refills | Status: DC

## 2018-05-08 MED ORDER — ACCU-CHEK SOFT TOUCH LANCETS MISC: 99 refills | Status: DC

## 2018-05-08 MED ORDER — AMLODIPINE BESYLATE 5 MG PO TABS: 5.0000 mg | ORAL_TABLET | Freq: Every day | ORAL | 1 refills | Status: DC

## 2018-05-08 MED ORDER — ATORVASTATIN CALCIUM 40 MG PO TABS: 40.0000 mg | ORAL_TABLET | Freq: Every day | ORAL | 1 refills | Status: DC

## 2018-05-09 NOTE — Progress Notes (Addendum)
Established Patient Office Visit  Subjective:  Patient ID: Tara Conner, female    DOB: 11-Sep-1945  Age: 72 y.o. MRN: [id number]  CC:  Chief Complaint  Patient presents with  . Follow-up    HPI Tara Conner presents for follow-up of her GERD, diabetes and anemia status post her CVA back in November.  She has follow-up with neurology tomorrow.  Prep stable discussed the duration of her Plavix therapy.  She is getting used to Santiago Glad deal regular insulin therapy along with her Lantus at night.  She is checking her blood sugars now 4 times a day and needs new prescriptions for her lancets and strips.  Diabetes seems to be under much better control.  Due for repeat hemoglobin A1c in 3 months.  She has been taking her iron as directed.  Follow-up on her anemia today.  Ranitidine has been recalled.  She has been using this mostly at night on a as needed basis.  She brings in an extensive list of blood pressures that have been running in the 120-130s over 60-80 range.  Pressure well controlled with the amlodipine.  She is having no edema with that drug.  History reviewed. No pertinent past medical history.  History reviewed. No pertinent surgical history.  History reviewed. No pertinent family history.  Social History   Socioeconomic History  . Marital status: Single    Spouse name: Not on file  . Number of children: Not on file  . Years of education: Not on file  . Highest education level: Not on file  Occupational History  . Not on file  Social Needs  . Financial resource strain: Not on file  . Food insecurity:    Worry: Not on file    Inability: Not on file  . Transportation needs:    Medical: Not on file    Non-medical: Not on file  Tobacco Use  . Smoking status: Never Smoker  . Smokeless tobacco: Never Used  Substance and Sexual Activity  . Alcohol use: Never    Frequency: Never  . Drug use: Never  . Sexual activity: Not Currently  Lifestyle  . Physical  activity:    Days per week: Not on file    Minutes per session: Not on file  . Stress: Not on file  Relationships  . Social connections:    Talks on phone: Not on file    Gets together: Not on file    Attends religious service: Not on file    Active member of club or organization: Not on file    Attends meetings of clubs or organizations: Not on file    Relationship status: Not on file  . Intimate partner violence:    Fear of current or ex partner: Not on file    Emotionally abused: Not on file    Physically abused: Not on file    Forced sexual activity: Not on file  Other Topics Concern  . Not on file  Social History Narrative  . Not on file    Outpatient Medications Prior to Visit  Medication Sig Dispense Refill  . aspirin EC 81 MG tablet Take 1 tablet (81 mg total) by mouth daily. 30 tablet 0  . atorvastatin (LIPITOR) 40 MG tablet TAKE 1 TABLET(40 MG) BY MOUTH DAILY 90 tablet 1  . Blood Glucose Monitoring Suppl (ACCU-CHEK AVIVA PLUS) w/Device KIT Use to test blood sugars 2 times daily, insurance does not cover one touch anymore. 1 kit 0  . clopidogrel (PLAVIX)  75 MG tablet TAKE 1 TABLET(75 MG) BY MOUTH DAILY 90 tablet 0  . ferrous sulfate 325 (65 FE) MG tablet Take 1 tablet (325 mg total) by mouth daily with breakfast. (Patient taking differently: Take 325-650 mg by mouth daily with breakfast. ) 90 tablet 1  . gabapentin (NEURONTIN) 300 MG capsule Take one at night for one week and then increase to one twice daily as tolerated. (Patient taking differently: Take 300 mg by mouth at bedtime as needed (for neuropathy). ) 60 capsule 3  . Glycerin-Polysorbate 80 (REFRESH DRY EYE THERAPY OP) Place 1-2 drops into both eyes 3 (three) times daily as needed (for dryness).    . insulin glargine (LANTUS) 100 UNIT/ML injection Inject 0.25 mLs (25 Units total) into the skin at bedtime. 30 mL 0  . insulin lispro (HUMALOG) 100 UNIT/ML injection Inject 0-0.15 mLs (0-15 Units total) into the skin 3  (three) times daily with meals. 30 mL 4  . Multiple Vitamins-Minerals (MULTIVITAMIN ADULT PO) Take 1 tablet by mouth daily.    Marland Kitchen NEEDLE, DISP, 30 G (B-D DISP NEEDLE 30GX1") 30G X 1" MISC To use with Lantus. 100 each 3  . amLODipine (NORVASC) 5 MG tablet TAKE 1 TABLET(5 MG) BY MOUTH DAILY 90 tablet 1  . glucose blood (ACCU-CHEK AVIVA PLUS) test strip Use to test blood sugars two times daily. 100 each 5  . Lancets (ACCU-CHEK SOFT TOUCH) lancets Use to check blood sugars two times daily. 100 each PRN  . ranitidine (ZANTAC) 150 MG capsule Take 1 capsule (150 mg total) by mouth 2 (two) times daily. (Patient taking differently: Take 150 mg by mouth 2 (two) times daily as needed for heartburn. ) 180 capsule 1   No facility-administered medications prior to visit.     Allergies  Allergen Reactions  . Sulfa Antibiotics Other (See Comments)    Pt unable to report reaction    ROS Review of Systems  Constitutional: Negative.   HENT: Negative.   Eyes: Negative for photophobia and visual disturbance.  Respiratory: Negative.   Cardiovascular: Negative.   Gastrointestinal: Negative.   Endocrine: Negative for polyphagia and polyuria.  Genitourinary: Negative.   Musculoskeletal: Positive for gait problem. Negative for joint swelling.  Skin: Negative for pallor and rash.  Allergic/Immunologic: Negative for immunocompromised state.  Neurological: Negative for light-headedness, numbness and headaches.  Hematological: Does not bruise/bleed easily.  Psychiatric/Behavioral: Negative.       Objective:    Physical Exam  Constitutional: She is oriented to person, place, and time. She appears well-developed and well-nourished. No distress.  HENT:  Head: Normocephalic and atraumatic.  Right Ear: External ear normal.  Left Ear: External ear normal.  Eyes: Right eye exhibits no discharge. Left eye exhibits no discharge. No scleral icterus.  Neck: No JVD present. No tracheal deviation present.    Cardiovascular: Normal rate, regular rhythm and normal heart sounds.  Pulmonary/Chest: Effort normal and breath sounds normal. No stridor.  Musculoskeletal:        General: No edema.  Neurological: She is alert and oriented to person, place, and time.  Skin: Skin is warm and dry. She is not diaphoretic.  Psychiatric: She has a normal mood and affect. Her behavior is normal.    BP 130/80   Ht 5' 6.5" (1.689 m)   Wt 202 lb (91.6 kg)   LMP  (LMP Unknown)   BMI 32.12 kg/m  Wt Readings from Last 3 Encounters:  05/10/18 202 lb (91.6 kg)  04/16/18 202 lb (91.6  kg)  04/07/18 200 lb (90.7 kg)   BP Readings from Last 3 Encounters:  05/10/18 130/80  04/16/18 124/80  04/10/18 (!) 143/82   Health Maintenance Due  Topic Date Due  . Hepatitis C Screening  04/23/47  . FOOT EXAM  01/18/1957  . OPHTHALMOLOGY EXAM  01/18/1957  . TETANUS/TDAP  01/18/1966  . MAMMOGRAM  01/18/1997  . COLONOSCOPY  01/18/1997  . DEXA SCAN  01/19/2012  . PNA vac Low Risk Adult (1 of 2 - PCV13) 01/19/2012    There are no preventive care reminders to display for this patient.  No results found for: TSH Lab Results  Component Value Date   WBC 6.4 04/09/2018   HGB 9.9 (L) 04/09/2018   HCT 30.0 (L) 04/09/2018   MCV 85.0 04/09/2018   PLT 366 04/09/2018   Lab Results  Component Value Date   NA 139 04/09/2018   K 3.8 04/09/2018   CO2 27 04/09/2018   GLUCOSE 115 (H) 04/09/2018   BUN 16 04/09/2018   CREATININE 1.10 (H) 04/09/2018   BILITOT 0.6 04/07/2018   ALKPHOS 80 04/07/2018   AST 21 04/07/2018   ALT 17 04/07/2018   PROT 8.1 04/07/2018   ALBUMIN 3.5 04/07/2018   CALCIUM 8.8 (L) 04/09/2018   ANIONGAP 5 04/09/2018   GFR 61.67 01/30/2018   Lab Results  Component Value Date   CHOL 120 04/08/2018   Lab Results  Component Value Date   HDL 32 (L) 04/08/2018   Lab Results  Component Value Date   LDLCALC 66 04/08/2018   Lab Results  Component Value Date   TRIG 109 04/08/2018   Lab Results   Component Value Date   CHOLHDL 3.8 04/08/2018   Lab Results  Component Value Date   HGBA1C 8.3 (H) 04/08/2018      Assessment & Plan:   Problem List Items Addressed This Visit      Cardiovascular and Mediastinum   Essential hypertension - Primary   Relevant Medications   amLODipine (NORVASC) 5 MG tablet   Other Relevant Orders   Basic metabolic panel     Digestive   Gastroesophageal reflux disease   Relevant Medications   cimetidine (TAGAMET) 300 MG tablet     Endocrine   Type 2 diabetes mellitus with diabetic neuropathy, with long-term current use of insulin (HCC)   Relevant Medications   glucose blood (ACCU-CHEK AVIVA PLUS) test strip   Lancets (ACCU-CHEK SOFT TOUCH) lancets     Other   Anemia   Relevant Orders   CBC   Iron, TIBC and Ferritin Panel   Iron deficiency anemia   Relevant Orders   CBC   Iron, TIBC and Ferritin Panel      Meds ordered this encounter  Medications  . glucose blood (ACCU-CHEK AVIVA PLUS) test strip    Sig: Test blood glucose 4 times daily    Dispense:  200 each    Refill:  5    Dx:E11.40, please use accu-chek aviva plus  Patient is now using prandial insulin.  . Lancets (ACCU-CHEK SOFT TOUCH) lancets    Sig: Use 4 times daily.   Patient now using prandial insulin.    Dispense:  100 each    Refill:  PRN    Dx:E11.40  . cimetidine (TAGAMET) 300 MG tablet    Sig: Take 1 tablet (300 mg total) by mouth at bedtime.    Dispense:  90 tablet    Refill:  4  . amLODipine (NORVASC) 5 MG tablet  Sig: TAKE 1 TABLET(5 MG) BY MOUTH DAILY    Dispense:  90 tablet    Refill:  1    **Patient requests 90 days supply**    Follow-up: Return in about 3 months (around 08/09/2018).

## 2018-05-10 ENCOUNTER — Ambulatory Visit (INDEPENDENT_AMBULATORY_CARE_PROVIDER_SITE_OTHER): Payer: Medicare Other | Admitting: Family Medicine

## 2018-05-10 ENCOUNTER — Encounter: Payer: Self-pay | Admitting: Family Medicine

## 2018-05-10 VITALS — BP 130/80 | Ht 66.5 in | Wt 202.0 lb

## 2018-05-10 DIAGNOSIS — D509 Iron deficiency anemia, unspecified: Secondary | ICD-10-CM

## 2018-05-10 DIAGNOSIS — I10 Essential (primary) hypertension: Secondary | ICD-10-CM | POA: Diagnosis not present

## 2018-05-10 DIAGNOSIS — K219 Gastro-esophageal reflux disease without esophagitis: Secondary | ICD-10-CM

## 2018-05-10 DIAGNOSIS — E1140 Type 2 diabetes mellitus with diabetic neuropathy, unspecified: Secondary | ICD-10-CM

## 2018-05-10 DIAGNOSIS — Z794 Long term (current) use of insulin: Secondary | ICD-10-CM

## 2018-05-10 DIAGNOSIS — D649 Anemia, unspecified: Secondary | ICD-10-CM

## 2018-05-10 MED ORDER — AMLODIPINE BESYLATE 5 MG PO TABS: ORAL_TABLET | ORAL | 1 refills | Status: DC

## 2018-05-10 MED ORDER — CIMETIDINE 300 MG PO TABS: 300.0000 mg | ORAL_TABLET | Freq: Every day | ORAL | 4 refills | Status: DC

## 2018-05-10 MED ORDER — GLUCOSE BLOOD VI STRP: ORAL_STRIP | 5 refills | Status: DC

## 2018-05-10 MED ORDER — ACCU-CHEK SOFT TOUCH LANCETS MISC: 99 refills | Status: DC

## 2018-05-10 NOTE — Addendum Note (Signed)
Addended by: Andrez GrimeKREMER, WILLIAM A on: 05/10/2018 02:58 PM   Modules accepted: Orders

## 2018-05-11 LAB — IRON,TIBC AND FERRITIN PANEL
%SAT: 14 % (calc) — ABNORMAL LOW (ref 16–45)
Ferritin: 70 ng/mL (ref 16–288)
Iron: 48 ug/dL (ref 45–160)
TIBC: 346 mcg/dL (calc) (ref 250–450)

## 2018-05-11 LAB — CBC
HCT: 30.1 % — ABNORMAL LOW (ref 36.0–46.0)
Hemoglobin: 10 g/dL — ABNORMAL LOW (ref 12.0–15.0)
MCHC: 33.3 g/dL (ref 30.0–36.0)
MCV: 86.2 fl (ref 78.0–100.0)
Platelets: 458 10*3/uL — ABNORMAL HIGH (ref 150.0–400.0)
RBC: 3.49 Mil/uL — ABNORMAL LOW (ref 3.87–5.11)
RDW: 13.8 % (ref 11.5–15.5)
WBC: 7.7 10*3/uL (ref 4.0–10.5)

## 2018-05-14 ENCOUNTER — Encounter: Payer: Self-pay | Admitting: Neurology

## 2018-05-14 ENCOUNTER — Ambulatory Visit (INDEPENDENT_AMBULATORY_CARE_PROVIDER_SITE_OTHER): Payer: Medicare Other | Admitting: Neurology

## 2018-05-14 VITALS — BP 128/74 | HR 103 | Ht 66.0 in | Wt 206.6 lb

## 2018-05-14 DIAGNOSIS — I6381 Other cerebral infarction due to occlusion or stenosis of small artery: Secondary | ICD-10-CM | POA: Diagnosis not present

## 2018-05-14 DIAGNOSIS — R269 Unspecified abnormalities of gait and mobility: Secondary | ICD-10-CM | POA: Diagnosis not present

## 2018-05-14 NOTE — Patient Instructions (Signed)
I had a long d/w patient about her recent lacunar [place] stroke, risk for recurrent stroke/TIAs, personally independently reviewed imaging studies and stroke evaluation results and answered questions.Continue aspirin 81 mg daily  for secondary stroke prevention and maintain strict control of hypertension with blood pressure goal below 130/90, diabetes with hemoglobin A1c goal below 6.5% and lipids with LDL cholesterol goal below 70 mg/dL. I also advised the patient to eat a healthy diet with plenty of whole grains, cereals, fruits and vegetables, exercise regularly and maintain ideal body weight Followup in the future with my nurse practitioner Shanda Bumps in 6 months  Or call earlier if necessary   Stroke Prevention Some medical conditions and behaviors are associated with a higher chance of having a stroke. You can help prevent a stroke by making nutrition, lifestyle, and other changes, including managing any medical conditions you may have. What nutrition changes can be made?  Eat healthy foods. You can do this by: ? Choosing foods high in fiber, such as fresh fruits and vegetables and whole grains. ? Eating at least 5 or more servings of fruits and vegetables a day. Try to fill half of your plate at each meal with fruits and vegetables. ? Choosing lean protein foods, such as lean cuts of meat, poultry without skin, fish, tofu, beans, and nuts. ? Eating low-fat dairy products. ? Avoiding foods that are high in salt (sodium). This can help lower blood pressure. ? Avoiding foods that have saturated fat, trans fat, and cholesterol. This can help prevent high cholesterol. ? Avoiding processed and premade foods.  Follow your health care provider's specific guidelines for losing weight, controlling high blood pressure (hypertension), lowering high cholesterol, and managing diabetes. These may include: ? Reducing your daily calorie intake. ? Limiting your daily sodium intake to 1,500 milligrams  (mg). ? Using only healthy fats for cooking, such as olive oil, canola oil, or sunflower oil. ? Counting your daily carbohydrate intake. What lifestyle changes can be made?  Maintain a healthy weight. Talk to your health care provider about your ideal weight.  Get at least 30 minutes of moderate physical activity at least 5 days a week. Moderate activity includes brisk walking, biking, and swimming.  Do not use any products that contain nicotine or tobacco, such as cigarettes and e-cigarettes. If you need help quitting, ask your health care provider. It may also be helpful to avoid exposure to secondhand smoke.  Limit alcohol intake to no more than 1 drink a day for nonpregnant women and 2 drinks a day for men. One drink equals 12 oz of beer, 5 oz of wine, or 1 oz of hard liquor.  Stop any illegal drug use.  Avoid taking birth control pills. Talk to your health care provider about the risks of taking birth control pills if: ? You are over 68 years old. ? You smoke. ? You get migraines. ? You have ever had a blood clot. What other changes can be made?  Manage your cholesterol levels. ? Eating a healthy diet is important for preventing high cholesterol. If cholesterol cannot be managed through diet alone, you may also need to take medicines. ? Take any prescribed medicines to control your cholesterol as told by your health care provider.  Manage your diabetes. ? Eating a healthy diet and exercising regularly are important parts of managing your blood sugar. If your blood sugar cannot be managed through diet and exercise, you may need to take medicines. ? Take any prescribed medicines to control  your diabetes as told by your health care provider.  Control your hypertension. ? To reduce your risk of stroke, try to keep your blood pressure below 130/80. ? Eating a healthy diet and exercising regularly are an important part of controlling your blood pressure. If your blood pressure cannot  be managed through diet and exercise, you may need to take medicines. ? Take any prescribed medicines to control hypertension as told by your health care provider. ? Ask your health care provider if you should monitor your blood pressure at home. ? Have your blood pressure checked every year, even if your blood pressure is normal. Blood pressure increases with age and some medical conditions.  Get evaluated for sleep disorders (sleep apnea). Talk to your health care provider about getting a sleep evaluation if you snore a lot or have excessive sleepiness.  Take over-the-counter and prescription medicines only as told by your health care provider. Aspirin or blood thinners (antiplatelets or anticoagulants) may be recommended to reduce your risk of forming blood clots that can lead to stroke.  Make sure that any other medical conditions you have, such as atrial fibrillation or atherosclerosis, are managed. What are the warning signs of a stroke? The warning signs of a stroke can be easily remembered as BEFAST.  B is for balance. Signs include: ? Dizziness. ? Loss of balance or coordination. ? Sudden trouble walking.  E is for eyes. Signs include: ? A sudden change in vision. ? Trouble seeing.  F is for face. Signs include: ? Sudden weakness or numbness of the face. ? The face or eyelid drooping to one side.  A is for arms. Signs include: ? Sudden weakness or numbness of the arm, usually on one side of the body.  S is for speech. Signs include: ? Trouble speaking (aphasia). ? Trouble understanding.  T is for time. ? These symptoms may represent a serious problem that is an emergency. Do not wait to see if the symptoms will go away. Get medical help right away. Call your local emergency services (911 in the [place] Do not drive yourself to the hospital.  Other signs of stroke may include: ? A sudden, severe headache with no known cause. ? Nausea or vomiting. ? Seizure.  Where to  find more information: For more information, visit:  American Stroke Association: www.strokeassociation.org  National Stroke Association: www.stroke.org  Summary  You can prevent a stroke by eating healthy, exercising, not smoking, limiting alcohol intake, and managing any medical conditions you may have.  Do not use any products that contain nicotine or tobacco, such as cigarettes and e-cigarettes. If you need help quitting, ask your health care provider. It may also be helpful to avoid exposure to secondhand smoke.  Remember BEFAST for warning signs of stroke. Get help right away if you or a loved one has any of these signs. This information is not intended to replace advice given to you by your health care provider. Make sure you discuss any questions you have with your health care provider. Document Released: 06/23/2004 Document Revised: 06/21/2016 Document Reviewed: 06/21/2016 Elsevier Interactive Patient Education  [employer]2018 [employer]

## 2018-05-14 NOTE — Progress Notes (Signed)
[facility] [address] [place]. [place] [address] [phone]       OFFICE FOLLOW-UP NOTE  [name] Date of Birth:  1945/09/06 Medical Record Number:  [id number]   HPI: Tara Conner is a pleasant 72 year old African-American lady seen today for initial office follow-up visit following hospital admission for stroke in November 2019.  History is obtained from the patient and review of electronic medical records.  I personally reviewed imaging films in PACS. HPI ( per Dr Rory Percy ) Tara Conner an 72 y.o.femaleWith PMH significant for DM 2, HTN presented to the ED With complaints of syncope, n/v, dizziness, generalized weakness and transient left facial weakness, facial numbness.  Per patient she has not been feeling herself for thepast 3-4 weeks since she received a high dose flu shot. Since then she has had some some recurrent HA (relieved with tylenol and advil), some dizziness (described as room spinning and sometimes lightheaded), and diarrhea that have completely resolved in between episodes.  Today while she was at church and standing she had a sudden onset of whole body weakness. So she asked to lean over on her son. She apparently then lost consciousness for about 30 secondsbefore coming around. Her son and others helped lower her to floor. She did not fall nor hit her head. She then regained consciousness and then had nausea and vomiting. This has never happened before and she has never had a stroke before. Denies any HA, CP, SOB, blurred vision during this episode.  Over the past 3 -4 weeks she has had intermittent episodes of dizziness/ lightheaded, Blurry vision. She also states that her HA will begin in the nape of herneck and radiate up her neck to about the middle of her neck. This comes and goesand she is completely normal in between episodes.  In the ED she had an episode of left facial numbness, and some left facial droopwitnessed  by ER RN - lasted 20 min. Admitted for stroke/TIA work up.Denies taking ASA daily. ED course:BP:161/100 BG: 206 CTH: no hemorrhage Date last known well:Date:04/07/2018 Time last known well:Time:11:30 tPA Given:No:outside of window Modified Rankin:Rankin Score=0 NIHSS:2; facial droop and left leg drift MRI scan of the brain showed acute to subacute right pontine infarct.  CT angiogram of the brain and neck showed nonstenotic mild extracranial atherosclerotic changes.  Carotid Doppler was not performed.  Transthoracic echo showed normal cardiac ejection fraction without any cardiac source of emboli.  LDL cholesterol was 66 mg percent and hemoglobin A1c was elevated at 8.3.  Patient was started on dual antiplatelet therapy aspirin and Plavix and discharged home with home physical and occupational therapy which she finished and she has recently finished outpatient therapy a week ago.  She is now able to walk with a 4 pronged cane which she uses mostly for outdoors.  She can walk indoors without it.  She has still mild dragging of her leg but states she is made near complete recovery.  She is still on both aspirin and Plavix and does bruise easily.  She states her blood pressure is well controlled and today it is 128/79.  She is tolerating her Lipitor well without muscle aches and pains.  She is on Humalog insulin now and this feels her sugars are better controlled.  She is excited about traveling to [place] to be with her family for Christmas and is asking for permission for the same. ROS:   14 system review of systems is positive for feeling hot, joint pain,  gait and balance difficulties and all other systems negative  PMH:  Past Medical History:  Diagnosis Date  . Diabetes mellitus without complication ([place])   . Hypertension   . Stroke [facility])     Social History:  Social History   Socioeconomic History  . Marital status: Single    Spouse name: Not on file  . Number of children: Not on file   . Years of education: Not on file  . Highest education level: Not on file  Occupational History  . Not on file  Social Needs  . Financial resource strain: Not on file  . Food insecurity:    Worry: Not on file    Inability: Not on file  . Transportation needs:    Medical: Not on file    Non-medical: Not on file  Tobacco Use  . Smoking status: Never Smoker  . Smokeless tobacco: Never Used  Substance and Sexual Activity  . Alcohol use: Never    Frequency: Never  . Drug use: Never  . Sexual activity: Not Currently  Lifestyle  . Physical activity:    Days per week: Not on file    Minutes per session: Not on file  . Stress: Not on file  Relationships  . Social connections:    Talks on phone: Not on file    Gets together: Not on file    Attends religious service: Not on file    Active member of club or organization: Not on file    Attends meetings of clubs or organizations: Not on file    Relationship status: Not on file  . Intimate partner violence:    Fear of current or ex partner: Not on file    Emotionally abused: Not on file    Physically abused: Not on file    Forced sexual activity: Not on file  Other Topics Concern  . Not on file  Social History Narrative  . Not on file    Medications:   Current Outpatient Medications on File Prior to Visit  Medication Sig Dispense Refill  . amLODipine (NORVASC) 5 MG tablet TAKE 1 TABLET(5 MG) BY MOUTH DAILY 90 tablet 1  . aspirin EC 81 MG tablet Take 1 tablet (81 mg total) by mouth daily. 30 tablet 0  . atorvastatin (LIPITOR) 40 MG tablet TAKE 1 TABLET(40 MG) BY MOUTH DAILY 90 tablet 1  . Blood Glucose Monitoring Suppl (ACCU-CHEK AVIVA PLUS) w/Device KIT Use to test blood sugars 2 times daily, insurance does not cover one touch anymore. 1 kit 0  . cimetidine (TAGAMET) 300 MG tablet Take 1 tablet (300 mg total) by mouth at bedtime. 90 tablet 4  . clopidogrel (PLAVIX) 75 MG tablet TAKE 1 TABLET(75 MG) BY MOUTH DAILY 90 tablet 0    . ferrous sulfate 325 (65 FE) MG tablet Take 1 tablet (325 mg total) by mouth daily with breakfast. (Patient taking differently: Take 325-650 mg by mouth daily with breakfast. ) 90 tablet 1  . gabapentin (NEURONTIN) 300 MG capsule Take one at night for one week and then increase to one twice daily as tolerated. (Patient taking differently: Take 300 mg by mouth at bedtime as needed (for neuropathy). ) 60 capsule 3  . glucose blood (ACCU-CHEK AVIVA PLUS) test strip Test blood glucose 4 times daily 200 each 5  . Glycerin-Polysorbate 80 (REFRESH DRY EYE THERAPY OP) Place 1-2 drops into both eyes 3 (three) times daily as needed (for dryness).    . insulin glargine (LANTUS) 100  UNIT/ML injection Inject 0.25 mLs (25 Units total) into the skin at bedtime. 30 mL 0  . insulin lispro (HUMALOG) 100 UNIT/ML injection Inject 0-0.15 mLs (0-15 Units total) into the skin 3 (three) times daily with meals. 30 mL 4  . Lancets (ACCU-CHEK SOFT TOUCH) lancets Use 4 times daily.   Patient now using prandial insulin. 100 each PRN  . Multiple Vitamins-Minerals (MULTIVITAMIN ADULT PO) Take 1 tablet by mouth daily.    Marland Kitchen NEEDLE, DISP, 30 G (B-D DISP NEEDLE 30GX1") 30G X 1" MISC To use with Lantus. 100 each 3   No current facility-administered medications on file prior to visit.     Allergies:   Allergies  Allergen Reactions  . Sulfa Antibiotics Other (See Comments)    Pt unable to report reaction    Physical Exam General: Mildly obese elderly African-American lady, seated, in no evident distress Head: head normocephalic and atraumatic.  Neck: supple with no carotid or supraclavicular bruits Cardiovascular: regular rate and rhythm, no murmurs Musculoskeletal: no deformity Skin:  no rash/petichiae Vascular:  Normal pulses all extremities Vitals:   05/14/18 1132  BP: 128/74  Pulse: (!) 103   Neurologic Exam Mental Status: Awake and fully alert. Oriented to place and time. Recent and remote memory intact.  Attention span, concentration and fund of knowledge appropriate. Mood and affect appropriate.  Cranial Nerves: Fundoscopic exam reveals sharp disc margins. Pupils equal, briskly reactive to light. Extraocular movements full without nystagmus. Visual fields full to confrontation. Hearing intact. Facial sensation intact.  Mild left lower facial asymmetry when she smiles., tongue, palate moves normally and symmetrically.  Motor: Normal bulk and tone. Normal strength in all tested extremity muscles mild weakness of left grip.  Diminished fine finger movements on the left.  Orbits right over left upper extremity.. Sensory.: intact to touch ,pinprick .position and vibratory sensation.  Coordination: Rapid alternating movements normal in all extremities. Finger-to-nose and heel-to-shin performed accurately bilaterally. Gait and Station: Arises from chair without difficulty. Stance is normal. Gait demonstrates normal stride length and and uses a 4 pronged cane.  There is slight dragging of the left leg..  Not able to heel, toe and tandem walk   Reflexes: 1+ and asymmetric and slightly brisker on the left.. Toes downgoing.   NIHSS  1 Modified Rankin  2   ASSESSMENT: 72 year old African-American lady with right pontine lacunar infarct due to small vessel disease in November 2019.  Vascular risk factors of diabetes, hypertension, hyperlipidemia and mild obesity.     PLAN: I had a long d/w patient about her recent lacunar Pontine stroke, risk for recurrent stroke/TIAs, personally independently reviewed imaging studies and stroke evaluation results and answered questions.Continue aspirin 81 mg daily  for secondary stroke prevention and maintain strict control of hypertension with blood pressure goal below 130/90, diabetes with hemoglobin A1c goal below 6.5% and lipids with LDL cholesterol goal below 70 mg/dL. I also advised the patient to eat a healthy diet with plenty of whole grains, cereals, fruits and  vegetables, exercise regularly and maintain ideal body weight Followup in the future with my nurse practitioner Tara Conner in 6 months  Or call earlier if necessary Greater than 50% of time during this 25 minute visit was spent on counseling,explanation of diagnosis, planning of further management, discussion with patient and family and coordination of care Tara Contras, MD  [facility] [address] [place], [place] [address]  Phone [phone] Fax [phone] Note: This document was prepared with digital dictation and  possible smart Company secretary. Any transcriptional errors that result from this process are unintentional

## 2018-06-29 ENCOUNTER — Ambulatory Visit: Payer: Medicare Other | Admitting: Family Medicine

## 2018-07-06 ENCOUNTER — Encounter: Payer: Self-pay | Admitting: Family Medicine

## 2018-07-06 ENCOUNTER — Ambulatory Visit (INDEPENDENT_AMBULATORY_CARE_PROVIDER_SITE_OTHER): Payer: Medicare Other | Admitting: Family Medicine

## 2018-07-06 VITALS — BP 120/72 | HR 97 | Temp 98.1°F | Ht 66.0 in | Wt 208.1 lb

## 2018-07-06 DIAGNOSIS — D509 Iron deficiency anemia, unspecified: Secondary | ICD-10-CM | POA: Diagnosis not present

## 2018-07-06 DIAGNOSIS — E7800 Pure hypercholesterolemia, unspecified: Secondary | ICD-10-CM | POA: Diagnosis not present

## 2018-07-06 DIAGNOSIS — I10 Essential (primary) hypertension: Secondary | ICD-10-CM

## 2018-07-06 DIAGNOSIS — E1140 Type 2 diabetes mellitus with diabetic neuropathy, unspecified: Secondary | ICD-10-CM | POA: Diagnosis not present

## 2018-07-06 DIAGNOSIS — Z794 Long term (current) use of insulin: Secondary | ICD-10-CM

## 2018-07-06 NOTE — Progress Notes (Signed)
Established Patient Office Visit  Subjective:  Patient ID: Tara Conner, female    DOB: 17-Jul-1945  Age: 73 y.o. MRN: [id number]  CC:  Chief Complaint  Patient presents with  . Follow-up    HPI Tara Conner presents for follow-up of her hypertension, diabetes elevated cholesterol.  Blood pressure is been well controlled with the Norvasc.  She is taking Lantus nightly and preprandial insulin pending her blood sugars before meals.  She is compliant with her atorvastatin.  She realizes the importance of controlling her blood pressure, diabetes cholesterol to prevent a further stroke.  She will continue Plavix.  She does claim compliance with her iron pills.  She is due for a wellness exam next month.  Past Medical History:  Diagnosis Date  . Diabetes mellitus without complication ([place])   . Hypertension   . Stroke [facility])     Past Surgical History:  Procedure Laterality Date  . GALLBLADDER SURGERY      History reviewed. No pertinent family history.  Social History   Socioeconomic History  . Marital status: Single    Spouse name: Not on file  . Number of children: Not on file  . Years of education: Not on file  . Highest education level: Not on file  Occupational History  . Not on file  Social Needs  . Financial resource strain: Not on file  . Food insecurity:    Worry: Not on file    Inability: Not on file  . Transportation needs:    Medical: Not on file    Non-medical: Not on file  Tobacco Use  . Smoking status: Never Smoker  . Smokeless tobacco: Never Used  Substance and Sexual Activity  . Alcohol use: Never    Frequency: Never  . Drug use: Never  . Sexual activity: Not Currently  Lifestyle  . Physical activity:    Days per week: Not on file    Minutes per session: Not on file  . Stress: Not on file  Relationships  . Social connections:    Talks on phone: Not on file    Gets together: Not on file    Attends religious service: Not on file   Active member of club or organization: Not on file    Attends meetings of clubs or organizations: Not on file    Relationship status: Not on file  . Intimate partner violence:    Fear of current or ex partner: Not on file    Emotionally abused: Not on file    Physically abused: Not on file    Forced sexual activity: Not on file  Other Topics Concern  . Not on file  Social History Narrative  . Not on file    Outpatient Medications Prior to Visit  Medication Sig Dispense Refill  . amLODipine (NORVASC) 5 MG tablet TAKE 1 TABLET(5 MG) BY MOUTH DAILY 90 tablet 1  . aspirin EC 81 MG tablet Take 1 tablet (81 mg total) by mouth daily. 30 tablet 0  . atorvastatin (LIPITOR) 40 MG tablet TAKE 1 TABLET(40 MG) BY MOUTH DAILY 90 tablet 1  . Blood Glucose Monitoring Suppl (ACCU-CHEK AVIVA PLUS) w/Device KIT Use to test blood sugars 2 times daily, insurance does not cover one touch anymore. 1 kit 0  . cimetidine (TAGAMET) 300 MG tablet Take 1 tablet (300 mg total) by mouth at bedtime. 90 tablet 4  . clopidogrel (PLAVIX) 75 MG tablet TAKE 1 TABLET(75 MG) BY MOUTH DAILY 90 tablet 0  .  ferrous sulfate 325 (65 FE) MG tablet Take 1 tablet (325 mg total) by mouth daily with breakfast. (Patient taking differently: Take 325-650 mg by mouth daily with breakfast. ) 90 tablet 1  . gabapentin (NEURONTIN) 300 MG capsule Take one at night for one week and then increase to one twice daily as tolerated. (Patient taking differently: Take 300 mg by mouth at bedtime as needed (for neuropathy). ) 60 capsule 3  . glucose blood (ACCU-CHEK AVIVA PLUS) test strip Test blood glucose 4 times daily 200 each 5  . Glycerin-Polysorbate 80 (REFRESH DRY EYE THERAPY OP) Place 1-2 drops into both eyes 3 (three) times daily as needed (for dryness).    . insulin glargine (LANTUS) 100 UNIT/ML injection Inject 0.25 mLs (25 Units total) into the skin at bedtime. 30 mL 0  . insulin lispro (HUMALOG) 100 UNIT/ML injection Inject 0-0.15 mLs (0-15  Units total) into the skin 3 (three) times daily with meals. 30 mL 4  . Lancets (ACCU-CHEK SOFT TOUCH) lancets Use 4 times daily.   Patient now using prandial insulin. 100 each PRN  . Multiple Vitamins-Minerals (MULTIVITAMIN ADULT PO) Take 1 tablet by mouth daily.    Marland Kitchen NEEDLE, DISP, 30 G (B-D DISP NEEDLE 30GX1") 30G X 1" MISC To use with Lantus. 100 each 3   No facility-administered medications prior to visit.     Allergies  Allergen Reactions  . Sulfa Antibiotics Other (See Comments)    Pt unable to report reaction    ROS Review of Systems  Constitutional: Negative.   Respiratory: Negative.   Cardiovascular: Negative.   Gastrointestinal: Negative.   Endocrine: Negative for polyphagia and polyuria.  Neurological: Negative for seizures, speech difficulty, weakness, light-headedness and headaches.  Psychiatric/Behavioral: Negative.       Objective:    Physical Exam  Constitutional: She is oriented to person, place, and time. She appears well-developed and well-nourished. No distress.  HENT:  Head: Normocephalic and atraumatic.  Right Ear: External ear normal.  Left Ear: External ear normal.  Eyes: Right eye exhibits no discharge. Left eye exhibits no discharge. No scleral icterus.  Neck: No JVD present. No tracheal deviation present.  Cardiovascular: Normal rate, regular rhythm and normal heart sounds.  Pulmonary/Chest: Effort normal and breath sounds normal. No stridor.  Neurological: She is alert and oriented to person, place, and time.  Skin: Skin is warm and dry. She is not diaphoretic.  Psychiatric: She has a normal mood and affect. Her behavior is normal.    BP 120/72   Pulse 97   Temp 98.1 F (36.7 C) (Oral)   Ht '5\' 6"'  (1.676 m)   Wt 208 lb 2 oz (94.4 kg)   LMP  (LMP Unknown)   SpO2 98%   BMI 33.59 kg/m  Wt Readings from Last 3 Encounters:  07/06/18 208 lb 2 oz (94.4 kg)  05/14/18 206 lb 9.6 oz (93.7 kg)  05/10/18 202 lb (91.6 kg)   BP Readings from  Last 3 Encounters:  07/06/18 120/72  05/14/18 128/74  05/10/18 130/80   Guideline developer:  UpToDate (see UpToDate for funding source) Date Released: June 2014  Health Maintenance Due  Topic Date Due  . Hepatitis C Screening  Dec 30, 1945  . FOOT EXAM  01/18/1957  . OPHTHALMOLOGY EXAM  01/18/1957  . TETANUS/TDAP  01/18/1966  . MAMMOGRAM  01/18/1997  . COLONOSCOPY  01/18/1997  . DEXA SCAN  01/19/2012  . PNA vac Low Risk Adult (1 of 2 - PCV13) 01/19/2012  There are no preventive care reminders to display for this patient.  No results found for: TSH Lab Results  Component Value Date   WBC 7.7 05/10/2018   HGB 10.0 (L) 05/10/2018   HCT 30.1 (L) 05/10/2018   MCV 86.2 05/10/2018   PLT 458.0 (H) 05/10/2018   Lab Results  Component Value Date   NA 139 04/09/2018   K 3.8 04/09/2018   CO2 27 04/09/2018   GLUCOSE 115 (H) 04/09/2018   BUN 16 04/09/2018   CREATININE 1.10 (H) 04/09/2018   BILITOT 0.6 04/07/2018   ALKPHOS 80 04/07/2018   AST 21 04/07/2018   ALT 17 04/07/2018   PROT 8.1 04/07/2018   ALBUMIN 3.5 04/07/2018   CALCIUM 8.8 (L) 04/09/2018   ANIONGAP 5 04/09/2018   GFR 61.67 01/30/2018   Lab Results  Component Value Date   CHOL 120 04/08/2018   Lab Results  Component Value Date   HDL 32 (L) 04/08/2018   Lab Results  Component Value Date   LDLCALC 66 04/08/2018   Lab Results  Component Value Date   TRIG 109 04/08/2018   Lab Results  Component Value Date   CHOLHDL 3.8 04/08/2018   Lab Results  Component Value Date   HGBA1C 8.3 (H) 04/08/2018      Assessment & Plan:   Problem List Items Addressed This Visit      Cardiovascular and Mediastinum   Essential hypertension - Primary     Endocrine   Type 2 diabetes mellitus with diabetic neuropathy, with long-term current use of insulin ([place])   Relevant Orders   Ambulatory referral to Ophthalmology     Other   Elevated LDL cholesterol level   Iron deficiency anemia      No orders of the  defined types were placed in this encounter.   Follow-up: Return in about 1 month (around 08/04/2018), or return fasting for wellness check. Marland Kitchen

## 2018-07-16 ENCOUNTER — Other Ambulatory Visit: Payer: Self-pay | Admitting: Family Medicine

## 2018-07-23 ENCOUNTER — Ambulatory Visit: Payer: Self-pay | Admitting: *Deleted

## 2018-07-23 NOTE — Telephone Encounter (Signed)
Contacted pt to discuss her concerns; she states that she bought some vitamins and would like to discuss them; the pt says that since her stroke she is not as strong as she used to be; the pt says she was told to contact her physician before taking these medications; they are as follows:   1. vitamin D3; she was told by a MD in [place] to take these  2. B12 super B complex with vitamin C: to help bones and muscles  3. calcium 600 mg 4. B6 5. B50 complex :because it was good for different things,  6. vitamin A 3000 mcg 7. Glucosamamine and chonrdritin  8. Magnesium oxide 400 mg 9. Chromium and picolinate Explained to pt that even though these vitamins are thought to be "good", having too much can also cause problems; further explained that recommendations are often made based on lab values; the pt also says that she was instructed to schedule an appointment with Dr Doreene Burke around 08/04/2018;the pt would like to discuss the previously listed supplements at that time; she also says that she is to have fasting labs at that time; pt offered appointment with Dr Nadene Rubins, [facility], 08/02/2018 at 1030.     Reason for Disposition . Caller requesting a NON-URGENT new prescription or refill and triager unable to refill per unit policy  Answer Assessment - Initial Assessment Questions 1. SYMPTOMS: "Do you have any symptoms?"     no 2. SEVERITY: If symptoms are present, ask "Are they mild, moderate or severe?"     no  Protocols used: MEDICATION QUESTION CALL-A-AH

## 2018-07-23 NOTE — Telephone Encounter (Signed)
FYI. She is coming in on 3/5 at 10:30am.

## 2018-08-02 ENCOUNTER — Encounter: Payer: Self-pay | Admitting: Gastroenterology

## 2018-08-02 ENCOUNTER — Ambulatory Visit (INDEPENDENT_AMBULATORY_CARE_PROVIDER_SITE_OTHER): Payer: Medicare Other | Admitting: Family Medicine

## 2018-08-02 ENCOUNTER — Encounter: Payer: Self-pay | Admitting: Family Medicine

## 2018-08-02 VITALS — BP 120/74 | HR 102 | Temp 98.0°F | Ht 66.0 in | Wt 204.4 lb

## 2018-08-02 DIAGNOSIS — I10 Essential (primary) hypertension: Secondary | ICD-10-CM | POA: Diagnosis not present

## 2018-08-02 DIAGNOSIS — D509 Iron deficiency anemia, unspecified: Secondary | ICD-10-CM

## 2018-08-02 DIAGNOSIS — E7800 Pure hypercholesterolemia, unspecified: Secondary | ICD-10-CM

## 2018-08-02 LAB — COMPREHENSIVE METABOLIC PANEL
ALT: 11 U/L (ref 0–35)
AST: 17 U/L (ref 0–37)
Albumin: 3.7 g/dL (ref 3.5–5.2)
Alkaline Phosphatase: 83 U/L (ref 39–117)
BUN: 32 mg/dL — ABNORMAL HIGH (ref 6–23)
CO2: 27 mEq/L (ref 19–32)
Calcium: 9.4 mg/dL (ref 8.4–10.5)
Chloride: 104 mEq/L (ref 96–112)
Creatinine, Ser: 1.14 mg/dL (ref 0.40–1.20)
GFR: 56.77 mL/min — ABNORMAL LOW (ref >60.00)
Glucose, Bld: 114 mg/dL — ABNORMAL HIGH (ref 70–99)
Potassium: 4.1 mEq/L (ref 3.5–5.1)
Sodium: 138 mEq/L (ref 135–145)
Total Bilirubin: 0.3 mg/dL (ref 0.2–1.2)
Total Protein: 8 g/dL (ref 6.0–8.3)

## 2018-08-02 LAB — LIPID PANEL
Cholesterol: 145 mg/dL (ref 0–200)
HDL: 42.5 mg/dL (ref >39.00)
LDL Cholesterol: 86 mg/dL (ref 0–99)
NonHDL: 102.78
Total CHOL/HDL Ratio: 3
Triglycerides: 85 mg/dL (ref 0.0–149.0)
VLDL: 17 mg/dL (ref 0.0–40.0)

## 2018-08-02 LAB — URINALYSIS, ROUTINE W REFLEX MICROSCOPIC
Bilirubin Urine: NEGATIVE
Hgb urine dipstick: NEGATIVE
Ketones, ur: NEGATIVE
Leukocytes,Ua: NEGATIVE
Nitrite: NEGATIVE
RBC / HPF: NONE SEEN (ref 0–?)
Specific Gravity, Urine: 1.01 (ref 1.000–1.030)
Total Protein, Urine: NEGATIVE
Urine Glucose: NEGATIVE
Urobilinogen, UA: 0.2 (ref 0.0–1.0)
pH: 6 (ref 5.0–8.0)

## 2018-08-02 LAB — TSH: TSH: 1.22 u[IU]/mL (ref 0.35–4.50)

## 2018-08-02 LAB — CBC
HCT: 31.8 % — ABNORMAL LOW (ref 36.0–46.0)
Hemoglobin: 10.7 g/dL — ABNORMAL LOW (ref 12.0–15.0)
MCHC: 33.7 g/dL (ref 30.0–36.0)
MCV: 81.9 fl (ref 78.0–100.0)
Platelets: 521 10*3/uL — ABNORMAL HIGH (ref 150.0–400.0)
RBC: 3.89 Mil/uL (ref 3.87–5.11)
RDW: 13.2 % (ref 11.5–15.5)
WBC: 5.9 10*3/uL (ref 4.0–10.5)

## 2018-08-02 NOTE — Progress Notes (Signed)
Established Patient Office Visit  Subjective:  Patient ID: Tara Conner, female    DOB: [birth year]  Age: 73 y.o. MRN: [id number]  CC:  Chief Complaint  Patient presents with  . Follow-up    HPI Tara Conner presents for follow-up over hypertension, elevated LDL cholesterol and iron deficiency anemia.  She is taking her blood pressure medicines as directed and her blood pressure has been well controlled.  Continues to take the atorvastatin.  She does have iron deficiency anemia and has been tolerating 1 iron pill daily.  She does not tolerate more than this.  Stools are dark from the iron but she is seeing no blood in the stool toilet bowl or in her urine.  She has not been able to go for the colonoscopy yet.  Continues to take Plavix with her history of CVA.  He brings in multiple bottles multiple vitamins for me to approve.  Continues to struggle somewhat with her diabetes control.  Past Medical History:  Diagnosis Date  . Diabetes mellitus without complication ([place])   . Hypertension   . Stroke [facility])     Past Surgical History:  Procedure Laterality Date  . GALLBLADDER SURGERY      History reviewed. No pertinent family history.  Social History   Socioeconomic History  . Marital status: Single    Spouse name: Not on file  . Number of children: Not on file  . Years of education: Not on file  . Highest education level: Not on file  Occupational History  . Not on file  Social Needs  . Financial resource strain: Not on file  . Food insecurity:    Worry: Not on file    Inability: Not on file  . Transportation needs:    Medical: Not on file    Non-medical: Not on file  Tobacco Use  . Smoking status: Never Smoker  . Smokeless tobacco: Never Used  Substance and Sexual Activity  . Alcohol use: Never    Frequency: Never  . Drug use: Never  . Sexual activity: Not Currently  Lifestyle  . Physical activity:    Days per week: Not on file    Minutes per session:  Not on file  . Stress: Not on file  Relationships  . Social connections:    Talks on phone: Not on file    Gets together: Not on file    Attends religious service: Not on file    Active member of club or organization: Not on file    Attends meetings of clubs or organizations: Not on file    Relationship status: Not on file  . Intimate partner violence:    Fear of current or ex partner: Not on file    Emotionally abused: Not on file    Physically abused: Not on file    Forced sexual activity: Not on file  Other Topics Concern  . Not on file  Social History Narrative  . Not on file    Outpatient Medications Prior to Visit  Medication Sig Dispense Refill  . amLODipine (NORVASC) 5 MG tablet TAKE 1 TABLET(5 MG) BY MOUTH DAILY 90 tablet 1  . aspirin EC 81 MG tablet Take 1 tablet (81 mg total) by mouth daily. 30 tablet 0  . atorvastatin (LIPITOR) 40 MG tablet TAKE 1 TABLET(40 MG) BY MOUTH DAILY 90 tablet 1  . Blood Glucose Monitoring Suppl (ACCU-CHEK AVIVA PLUS) w/Device KIT Use to test blood sugars 2 times daily, insurance does not cover one  touch anymore. 1 kit 0  . cimetidine (TAGAMET) 300 MG tablet Take 1 tablet (300 mg total) by mouth at bedtime. 90 tablet 4  . clopidogrel (PLAVIX) 75 MG tablet TAKE 1 TABLET(75 MG) BY MOUTH DAILY 90 tablet 2  . ferrous sulfate 325 (65 FE) MG tablet Take 1 tablet (325 mg total) by mouth daily with breakfast. (Patient taking differently: Take 325-650 mg by mouth daily with breakfast. ) 90 tablet 1  . gabapentin (NEURONTIN) 300 MG capsule Take one at night for one week and then increase to one twice daily as tolerated. (Patient taking differently: Take 300 mg by mouth at bedtime as needed (for neuropathy). ) 60 capsule 3  . glucose blood (ACCU-CHEK AVIVA PLUS) test strip Test blood glucose 4 times daily 200 each 5  . Glycerin-Polysorbate 80 (REFRESH DRY EYE THERAPY OP) Place 1-2 drops into both eyes 3 (three) times daily as needed (for dryness).    .  insulin glargine (LANTUS) 100 UNIT/ML injection Inject 0.25 mLs (25 Units total) into the skin at bedtime. 30 mL 0  . insulin lispro (HUMALOG) 100 UNIT/ML injection Inject 0-0.15 mLs (0-15 Units total) into the skin 3 (three) times daily with meals. 30 mL 4  . Lancets (ACCU-CHEK SOFT TOUCH) lancets Use 4 times daily.   Patient now using prandial insulin. 100 each PRN  . Multiple Vitamins-Minerals (MULTIVITAMIN ADULT PO) Take 1 tablet by mouth daily.    Marland Kitchen NEEDLE, DISP, 30 G (B-D DISP NEEDLE 30GX1") 30G X 1" MISC To use with Lantus. 100 each 3   No facility-administered medications prior to visit.     Allergies  Allergen Reactions  . Sulfa Antibiotics Other (See Comments)    Pt unable to report reaction    ROS Review of Systems  Constitutional: Negative for diaphoresis, fatigue, fever and unexpected weight change.  HENT: Negative.   Eyes: Negative for photophobia and visual disturbance.  Respiratory: Negative for chest tightness and shortness of breath.   Cardiovascular: Negative.   Gastrointestinal: Negative for abdominal pain, anal bleeding and blood in stool.  Endocrine: Negative for polyphagia and polyuria.  Genitourinary: Negative for dysuria, frequency and hematuria.  Musculoskeletal: Positive for gait problem. Negative for joint swelling.  Skin: Negative for pallor.  Allergic/Immunologic: Negative for immunocompromised state.  Neurological: Negative for light-headedness and headaches.  Hematological: Negative.   Psychiatric/Behavioral: Negative.       Objective:    Physical Exam  Constitutional: She is oriented to person, place, and time. She appears well-developed and well-nourished. No distress.  HENT:  Head: Normocephalic and atraumatic.  Right Ear: External ear normal.  Left Ear: External ear normal.  Eyes: Conjunctivae are normal. Right eye exhibits no discharge. Left eye exhibits no discharge. No scleral icterus.  Neck: No JVD present. No tracheal deviation  present.  Cardiovascular: Normal rate, regular rhythm and normal heart sounds.  Pulmonary/Chest: Effort normal.  Neurological: She is alert and oriented to person, place, and time.  Skin: Skin is warm and dry. She is not diaphoretic.  Psychiatric: She has a normal mood and affect. Her behavior is normal.    BP 120/74   Pulse (!) 102   Temp 98 F (36.7 C) (Oral)   Ht '5\' 6"'  (1.676 m)   Wt 204 lb 6 oz (92.7 kg)   LMP  (LMP Unknown)   SpO2 96%   BMI 32.99 kg/m  Wt Readings from Last 3 Encounters:  08/02/18 204 lb 6 oz (92.7 kg)  07/06/18 208 lb 2  oz (94.4 kg)  05/14/18 206 lb 9.6 oz (93.7 kg)   BP Readings from Last 3 Encounters:  08/02/18 120/74  07/06/18 120/72  05/14/18 128/74   Guideline developer:  UpToDate (see UpToDate for funding source) Date Released: June 2014  Health Maintenance Due  Topic Date Due  . Hepatitis C Screening  19-Jun-1945  . FOOT EXAM  01/18/1957  . OPHTHALMOLOGY EXAM  01/18/1957  . TETANUS/TDAP  01/18/1966  . MAMMOGRAM  01/18/1997  . COLONOSCOPY  01/18/1997  . DEXA SCAN  01/19/2012  . PNA vac Low Risk Adult (1 of 2 - PCV13) 01/19/2012    There are no preventive care reminders to display for this patient.  No results found for: TSH Lab Results  Component Value Date   WBC 7.7 05/10/2018   HGB 10.0 (L) 05/10/2018   HCT 30.1 (L) 05/10/2018   MCV 86.2 05/10/2018   PLT 458.0 (H) 05/10/2018   Lab Results  Component Value Date   NA 139 04/09/2018   K 3.8 04/09/2018   CO2 27 04/09/2018   GLUCOSE 115 (H) 04/09/2018   BUN 16 04/09/2018   CREATININE 1.10 (H) 04/09/2018   BILITOT 0.6 04/07/2018   ALKPHOS 80 04/07/2018   AST 21 04/07/2018   ALT 17 04/07/2018   PROT 8.1 04/07/2018   ALBUMIN 3.5 04/07/2018   CALCIUM 8.8 (L) 04/09/2018   ANIONGAP 5 04/09/2018   GFR 61.67 01/30/2018   Lab Results  Component Value Date   CHOL 120 04/08/2018   Lab Results  Component Value Date   HDL 32 (L) 04/08/2018   Lab Results  Component Value Date    LDLCALC 66 04/08/2018   Lab Results  Component Value Date   TRIG 109 04/08/2018   Lab Results  Component Value Date   CHOLHDL 3.8 04/08/2018   Lab Results  Component Value Date   HGBA1C 8.3 (H) 04/08/2018      Assessment & Plan:   Problem List Items Addressed This Visit      Cardiovascular and Mediastinum   Essential hypertension - Primary   Relevant Orders   CBC   Comprehensive metabolic panel   Urinalysis, Routine w reflex microscopic     Other   Elevated LDL cholesterol level   Relevant Orders   Comprehensive metabolic panel   Lipid panel   Iron deficiency anemia   Relevant Orders   CBC   TSH   Urinalysis, Routine w reflex microscopic   Ambulatory referral to Gastroenterology   Fecal occult blood, imunochemical([facility])      No orders of the defined types were placed in this encounter.   Follow-up: Return in about 3 months (around 11/02/2018).   She thinks that she may be able to go for the colonoscopy consultation.  Stressed the importance of this exam for her regarding her iron deficiency anemia.  I advised her to continue with multivitamin, B complex, thousand international units of vitamin D daily and 600 mg of calcium twice daily.  She will discontinue her other vitamins.

## 2018-08-06 NOTE — Progress Notes (Signed)
[facility] Note  08/08/2018     CHIEF COMPLAINT Patient presents for Diabetic Eye Exam   HISTORY OF PRESENT ILLNESS: Tara Conner is a 73 y.o. female who presents to the clinic today for:   HPI    Diabetic Eye Exam    Vision is stable (Lost glasses).  Associated Symptoms Negative for Flashes, Blind Spot, Photophobia, Scalp Tenderness, Fever, Floaters, Pain, Glare, Jaw Claudication, Weight Loss, Distortion, Redness, Trauma, Shoulder/Hip pain and Fatigue.  Diabetes characteristics include Type 2 and on insulin.  This started 20 years ago.  Blood sugar level fluctuates.  Last Blood Glucose 160.  Last A1C: Checked last week-waiting on results .  Associated Diagnosis Neuropathy.  I, the attending physician,  performed the HPI with the patient and updated documentation appropriately.          Comments    Patient states referred by primary care doctor for diabetic eye exam. Last eye exam about 1 year ago. BS (control) runs around 160-170 in am. Lost glasses about 3 months ago. Had stroke in October or November of 2019. Patient hasn't noticed any effect on vision from stroke. Stroke affected mobility on left side-physical therapy has helped with mobility.        Last edited by Bernarda Caffey, MD on 08/08/2018  2:01 PM. (History)    pt states she was sent here by her PCP, Dr. Abelino Derrick for DM exam, pt states she lost her glasses recently, she moved here from [place] about 2 years ago, she states she had cataract sx about 20 years ago and had laser sx about 10 years, she states she has not seen an eye dr since she moved here, she states the laser procedure was done in both eyes bc she had bleeding in her eyes, pt states she had a slight stroke around November 2019, she states it did not impact her vision and she has no complaints about her vision  Referring physician: Libby Maw, MD [place], [place] [id number]  HISTORICAL  INFORMATION:   Selected notes from the MEDICAL RECORD NUMBER Referred by Dr. Abelino Derrick for DM exam LEE:  Ocular Hx- PMH-DM (A1C: 8.3 takes Lantus), HTN, stroke    CURRENT MEDICATIONS: Current Outpatient Medications (Ophthalmic Drugs)  Medication Sig  . Glycerin-Polysorbate 80 (REFRESH DRY EYE THERAPY OP) Place 1-2 drops into both eyes 3 (three) times daily as needed (for dryness).   No current facility-administered medications for this visit.  (Ophthalmic Drugs)   Current Outpatient Medications (Other)  Medication Sig  . amLODipine (NORVASC) 5 MG tablet TAKE 1 TABLET(5 MG) BY MOUTH DAILY  . atorvastatin (LIPITOR) 40 MG tablet TAKE 1 TABLET(40 MG) BY MOUTH DAILY  . Blood Glucose Monitoring Suppl (ACCU-CHEK AVIVA PLUS) w/Device KIT Use to test blood sugars 2 times daily, insurance does not cover one touch anymore.  . cimetidine (TAGAMET) 300 MG tablet Take 1 tablet (300 mg total) by mouth at bedtime.  . clopidogrel (PLAVIX) 75 MG tablet TAKE 1 TABLET(75 MG) BY MOUTH DAILY  . ferrous sulfate 325 (65 FE) MG tablet Take 1 tablet (325 mg total) by mouth daily with breakfast. (Patient taking differently: Take 325-650 mg by mouth daily with breakfast. )  . gabapentin (NEURONTIN) 300 MG capsule Take one at night for one week and then increase to one twice daily as tolerated. (Patient taking differently: Take 300 mg by mouth at bedtime as needed (for neuropathy). )  . glucose blood (  ACCU-CHEK AVIVA PLUS) test strip Test blood glucose 4 times daily  . insulin glargine (LANTUS) 100 UNIT/ML injection Inject 0.25 mLs (25 Units total) into the skin at bedtime.  . insulin lispro (HUMALOG) 100 UNIT/ML injection Inject 0-0.15 mLs (0-15 Units total) into the skin 3 (three) times daily with meals.  . Lancets (ACCU-CHEK SOFT TOUCH) lancets Use 4 times daily.   Patient now using prandial insulin.  . Multiple Vitamins-Minerals (MULTIVITAMIN ADULT PO) Take 1 tablet by mouth daily.  Marland Kitchen NEEDLE, DISP, 30 G (B-D  DISP NEEDLE 30GX1") 30G X 1" MISC To use with Lantus.  Marland Kitchen aspirin EC 81 MG tablet Take 1 tablet (81 mg total) by mouth daily. (Patient not taking: Reported on 08/08/2018)   No current facility-administered medications for this visit.  (Other)      REVIEW OF SYSTEMS: ROS    Positive for: Neurological, Endocrine, Eyes   Negative for: Constitutional, Gastrointestinal, Skin, Genitourinary, Musculoskeletal, HENT, Cardiovascular, Respiratory, Psychiatric, Allergic/Imm, Heme/Lymph   Last edited by Roselee Nova D on 08/08/2018  1:34 PM. (History)       ALLERGIES Allergies  Allergen Reactions  . Sulfa Antibiotics Other (See Comments)    Pt unable to report reaction    PAST MEDICAL HISTORY Past Medical History:  Diagnosis Date  . Diabetes mellitus without complication ([place])   . Hypertension   . Stroke [facility]) 2019   Past Surgical History:  Procedure Laterality Date  . CATARACT EXTRACTION Bilateral   . GALLBLADDER SURGERY      FAMILY HISTORY Family History  Problem Relation Age of Onset  . Diabetes Mother   . Diabetes Sister   . Diabetes Brother     SOCIAL HISTORY Social History   Tobacco Use  . Smoking status: Never Smoker  . Smokeless tobacco: Never Used  Substance Use Topics  . Alcohol use: Never    Frequency: Never  . Drug use: Never         OPHTHALMIC EXAM:  Base Eye Exam    Visual Acuity (Snellen - Linear)      Right Left   Dist [place] 20/40 -2 20/30 +2   Dist ph [place] 20/30 NI       Tonometry (Tonopen, 1:48 PM)      Right Left   Pressure 18 13       Pupils      Dark Light Shape React APD   Right 3 2 Round Slow None   Left 3 2 Round Slow None       Visual Fields (Counting fingers)      Left Right    Full Full       Extraocular Movement      Right Left    Full, Ortho Full, Ortho       Neuro/Psych    Oriented x3:  Yes   Mood/Affect:  Normal       Dilation    Both eyes:  1.0% Mydriacyl, 2.5% Phenylephrine @ 1:48 PM        Slit Lamp and  Fundus Exam    Slit Lamp Exam      Right Left   Lids/Lashes Dermatochalasis - upper lid, Meibomian gland dysfunction Dermatochalasis - upper lid, Meibomian gland dysfunction   Conjunctiva/Sclera mild, nasal Pinguecula, mild Melanosis nasal Pinguecula, mild Melanosis   Cornea Arcus, 2-3+ Punctate epithelial erosions Arcus, 2+ Punctate epithelial erosions   Anterior Chamber Deep and quiet Deep and quiet   Iris slightly irregular pupil, No NVI slightly irregular pupil, No  NVI   Lens PC IOL in good position PC IOL in good position, 3+white PCO   Vitreous mild Vitreous syneresis mild Vitreous syneresis       Fundus Exam      Right Left   Disc Sharp rim, 2+ Pallor trace Pallor, Sharp rim   C/D Ratio 0.65 0.6   Macula Flat, Blunted foveal reflex, mild Retinal pigment epithelial mottling Blunted foveal reflex, Epiretinal membrane, Focal laser scars   Vessels Vascular attenuation, +sclerosis Vascular attenuation, early sclerosis/sheathing, AV crossing changes   Periphery Attached, 360 PRP -- mild room for fill in Attached, 360 PRP -- room for fill in peripherally        Refraction    Manifest Refraction (Retinoscopy)      Sphere Cylinder Axis Dist VA   Right -1.25 +2.00 020 20/25-1   Left +0.75 +2.00 020 20/25          IMAGING AND PROCEDURES  Imaging and Procedures for '@TODAY' @  OCT, Retina - OU - Both Eyes       Right Eye Quality was good. Central Foveal Thickness: 260. Progression has no prior data. Findings include normal foveal contour, no IRF, no SRF (diffuse atrophy and retinal thinning, inferior greater than superior macula, trace ERM, partial PVD).   Left Eye Quality was borderline. Central Foveal Thickness: 279. Progression has no prior data. Findings include normal foveal contour, no SRF, no IRF (Trace ERM).   Notes *Images captured and stored on drive  Diagnosis / Impression:  No DME OU Diffuse atrophy OD   Clinical management:  See below  Abbreviations: NFP -  Normal foveal profile. CME - cystoid macular edema. PED - pigment epithelial detachment. IRF - intraretinal fluid. SRF - subretinal fluid. EZ - ellipsoid zone. ERM - epiretinal membrane. ORA - outer retinal atrophy. ORT - outer retinal tubulation. SRHM - subretinal hyper-reflective material        Fluorescein Angiography Optos (Transit OD)       Right Eye   Progression has no prior data. Early phase findings include delayed filling, leakage, retinal neovascularization, vascular perfusion defect, microaneurysm, staining. Mid/Late phase findings include staining, leakage, microaneurysm, retinal neovascularization, vascular perfusion defect.   Left Eye   Progression has no prior data. Early phase findings include leakage, staining, microaneurysm, retinal neovascularization, vascular perfusion defect. Mid/Late phase findings include leakage, staining, microaneurysm, retinal neovascularization, vascular perfusion defect.   Notes **Images stored on drive**  Impression: PDR OU Leaking NV OU (OD > OS) Scattered capillary nonperfusion                 ASSESSMENT/PLAN:    ICD-10-CM   1. Proliferative diabetic retinopathy of both eyes without macular edema associated with type 2 diabetes mellitus (HCC) I69.6295 Fluorescein Angiography Optos (Transit OD)  2. Retinal ischemia due to type 2 diabetes mellitus (HCC) E11.39    H34.9   3. Retinal edema H35.81 OCT, Retina - OU - Both Eyes  4. Essential hypertension I10   5. Hypertensive retinopathy of both eyes H35.033 Fluorescein Angiography Optos (Transit OD)  6. Pseudophakia of both eyes Z96.1     1-3. Proliferative diabetic retinopathy w/o DME, OU (OD > OS) - history of PRP OU in [place] -- moved here appr. 2 years  - The incidence, risk factors for progression, natural history and treatment options for diabetic retinopathy were discussed with patient.   - The need for close monitoring of blood glucose, blood pressure, and serum  lipids, avoiding cigarette or any type of  tobacco, and the need for long term follow up was also discussed with patient. - exam shows ischemic looking macula with sclerotic vessels and flat fibrotic NV OU - FA today, 03.11.20, shows patches of residual NVE with active leakage OU (OD > OS) -- recommend PRP fill in OU (OD first) - OCT without diabetic macular edema, both eyes -- but diffuse atrophy OU - BCVA 20/25 OU - f/u in 4 weeks -- DFE/OCT and likely PRP fill in OD  4,5. Hypertensive retinopathy OU - discussed importance of tight BP control - monitor  6. Pseudophakia OU  - s/p CE/IOL OU - 20 years ago, [place]  - doing well  - OS with significant fibrotic PCO  - monitor   Ophthalmic Meds Ordered this visit:  No orders of the defined types were placed in this encounter.      Return in about 4 weeks (around 09/05/2018) for f/u DM exam, DFE, OCT.  There are no Patient Instructions on file for this visit.   Explained the diagnoses, plan, and follow up with the patient and they expressed understanding.  Patient expressed understanding of the importance of proper follow up care.   This document serves as a record of services personally performed by Gardiner Sleeper, MD, PhD. It was created on their behalf by Ernest Mallick, OA, an ophthalmic assistant. The creation of this record is the provider's dictation and/or activities during the visit.    Electronically signed by: Ernest Mallick, OA  03.09.2020 4:21 PM    Gardiner Sleeper, M.D., Ph.D. Diseases & Surgery of the Retina and Vitreous [facility]  I have reviewed the above documentation for accuracy and completeness, and I agree with the above. Gardiner Sleeper, M.D., Ph.D. 08/08/18 4:21 PM      Abbreviations: M myopia (nearsighted); A astigmatism; H hyperopia (farsighted); P presbyopia; Mrx spectacle prescription;  CTL contact lenses; OD right eye; OS left eye; OU both eyes  XT exotropia; ET esotropia; PEK  punctate epithelial keratitis; PEE punctate epithelial erosions; DES dry eye syndrome; MGD meibomian gland dysfunction; ATs artificial tears; PFAT's preservative free artificial tears; [place] nuclear sclerotic cataract; PSC posterior subcapsular cataract; ERM epi-retinal membrane; PVD posterior vitreous detachment; RD retinal detachment; DM diabetes mellitus; DR diabetic retinopathy; NPDR non-proliferative diabetic retinopathy; PDR proliferative diabetic retinopathy; CSME clinically significant macular edema; DME diabetic macular edema; dbh dot blot hemorrhages; CWS cotton wool spot; POAG primary open angle glaucoma; C/D cup-to-disc ratio; HVF humphrey visual field; GVF goldmann visual field; OCT optical coherence tomography; IOP intraocular pressure; BRVO Branch retinal vein occlusion; CRVO central retinal vein occlusion; CRAO central retinal artery occlusion; BRAO branch retinal artery occlusion; RT retinal tear; SB scleral buckle; PPV pars plana vitrectomy; VH Vitreous hemorrhage; PRP panretinal laser photocoagulation; IVK intravitreal kenalog; VMT vitreomacular traction; MH Macular hole;  NVD neovascularization of the disc; NVE neovascularization elsewhere; AREDS age related eye disease study; ARMD age related macular degeneration; POAG primary open angle glaucoma; EBMD epithelial/anterior basement membrane dystrophy; ACIOL anterior chamber intraocular lens; IOL intraocular lens; PCIOL posterior chamber intraocular lens; Phaco/IOL phacoemulsification with intraocular lens placement; [place] photorefractive keratectomy; LASIK laser assisted in situ keratomileusis; HTN hypertension; DM diabetes mellitus; COPD chronic obstructive pulmonary disease

## 2018-08-08 ENCOUNTER — Other Ambulatory Visit: Payer: Self-pay

## 2018-08-08 ENCOUNTER — Ambulatory Visit (INDEPENDENT_AMBULATORY_CARE_PROVIDER_SITE_OTHER): Payer: Medicare Other | Admitting: Ophthalmology

## 2018-08-08 ENCOUNTER — Encounter (INDEPENDENT_AMBULATORY_CARE_PROVIDER_SITE_OTHER): Payer: Self-pay | Admitting: Ophthalmology

## 2018-08-08 DIAGNOSIS — E1139 Type 2 diabetes mellitus with other diabetic ophthalmic complication: Secondary | ICD-10-CM

## 2018-08-08 DIAGNOSIS — H3581 Retinal edema: Secondary | ICD-10-CM | POA: Diagnosis not present

## 2018-08-08 DIAGNOSIS — Z961 Presence of intraocular lens: Secondary | ICD-10-CM

## 2018-08-08 DIAGNOSIS — I10 Essential (primary) hypertension: Secondary | ICD-10-CM

## 2018-08-08 DIAGNOSIS — H35033 Hypertensive retinopathy, bilateral: Secondary | ICD-10-CM

## 2018-08-08 DIAGNOSIS — E113593 Type 2 diabetes mellitus with proliferative diabetic retinopathy without macular edema, bilateral: Secondary | ICD-10-CM | POA: Diagnosis not present

## 2018-08-08 DIAGNOSIS — H349 Unspecified retinal vascular occlusion: Secondary | ICD-10-CM

## 2018-08-09 ENCOUNTER — Telehealth: Payer: Self-pay | Admitting: Family Medicine

## 2018-08-09 MED ORDER — GLUCOSE BLOOD VI STRP: ORAL_STRIP | 6 refills | Status: DC

## 2018-08-09 NOTE — Telephone Encounter (Signed)
Rx sent in

## 2018-08-09 NOTE — Telephone Encounter (Signed)
Patient needs One Touch Ultra 2 Test Strips, wrong one was sent in. Not on medication list.  Pharmacy:  [id number] - [place], [place] - [address] AT [address] [phone] (Phone) [phone] (Fax)

## 2018-08-09 NOTE — Telephone Encounter (Signed)
Copied from CRM [id number]. Topic: Quick Communication - Rx Refill/Question >> Aug 09, 2018 10:44 AM Floria Raveling A wrote: Medication: pt called in and stated that she needs the one touch ultra 2 test strips.  She stated the wrong strips was called in .  She would like   Has the patient contacted their pharmacy? no (Agent: If no, request that the patient contact the pharmacy for the refill.) (Agent: If yes, when and what did the pharmacy advise?)  Preferred Pharmacy (with phone number or street name) [id number] - [place], [place] - [address] AT [address] [phone] (Phone) [phone] (Fax)    Agent: Please be advised that RX refills may take up to 3 business days. We ask that you follow-up with your pharmacy.

## 2018-08-25 ENCOUNTER — Other Ambulatory Visit: Payer: Self-pay | Admitting: Family Medicine

## 2018-08-31 ENCOUNTER — Ambulatory Visit: Payer: Medicare Other | Admitting: Gastroenterology

## 2018-09-07 ENCOUNTER — Encounter (INDEPENDENT_AMBULATORY_CARE_PROVIDER_SITE_OTHER): Payer: Medicare Other | Admitting: Ophthalmology

## 2018-09-14 ENCOUNTER — Other Ambulatory Visit: Payer: Self-pay | Admitting: Family Medicine

## 2018-09-14 DIAGNOSIS — E1140 Type 2 diabetes mellitus with diabetic neuropathy, unspecified: Secondary | ICD-10-CM

## 2018-09-14 DIAGNOSIS — Z794 Long term (current) use of insulin: Principal | ICD-10-CM

## 2018-09-21 ENCOUNTER — Other Ambulatory Visit: Payer: Self-pay | Admitting: Family Medicine

## 2018-09-21 NOTE — Telephone Encounter (Signed)
Dr. Doreene Burke please advise you have never prescribed this medication. Last refilled 04/10/2018 by Dow Adolph

## 2018-10-10 ENCOUNTER — Encounter (INDEPENDENT_AMBULATORY_CARE_PROVIDER_SITE_OTHER): Payer: Medicare Other | Admitting: Ophthalmology

## 2018-10-18 ENCOUNTER — Telehealth: Payer: Self-pay | Admitting: Family Medicine

## 2018-10-18 NOTE — Telephone Encounter (Signed)
Copied from CRM [id number]. Topic: Quick Communication - Rx Refill/Question >> Oct 18, 2018  5:11 PM Maye Hides wrote: Medication: ACCU-CHEK  machine. Already has lancets  Has the patient contacted their pharmacy? yes   Preferred Pharmacy (with phone number or street name):[id number] - [place], [place] - [address] AT [address] [phone] (Phone) [phone] (Fax)    Agent: Please be advised that RX refills may take up to 3 business days. We ask that you follow-up with your pharmacy.

## 2018-10-19 MED ORDER — ACCU-CHEK AVIVA PLUS W/DEVICE KIT: PACK | 0 refills | Status: DC

## 2018-10-19 NOTE — Telephone Encounter (Signed)
Rx sent in

## 2018-10-31 ENCOUNTER — Telehealth: Payer: Medicare Other | Admitting: Gastroenterology

## 2018-11-02 ENCOUNTER — Telehealth (INDEPENDENT_AMBULATORY_CARE_PROVIDER_SITE_OTHER): Payer: Medicare Other | Admitting: Gastroenterology

## 2018-11-02 ENCOUNTER — Encounter: Payer: Self-pay | Admitting: Gastroenterology

## 2018-11-02 ENCOUNTER — Other Ambulatory Visit: Payer: Self-pay

## 2018-11-02 VITALS — Ht 67.0 in | Wt 205.0 lb

## 2018-11-02 DIAGNOSIS — D509 Iron deficiency anemia, unspecified: Secondary | ICD-10-CM | POA: Diagnosis not present

## 2018-11-02 MED ORDER — NA SULFATE-K SULFATE-MG SULF 17.5-3.13-1.6 GM/177ML PO SOLN: 1.0000 | Freq: Once | ORAL | 0 refills | Status: AC

## 2018-11-02 NOTE — Progress Notes (Signed)
Chief Complaint: Iron deficiency anemia  Referring Provider:   Libby Maw, MD   HPI:    Due to current restrictions/limitations of in-office visits due to the COVID-19 pandemic, this scheduled clinical appointment was converted to a telehealth consultation via telephone.  -The patient did consent to this telephone visit and is aware of possible charges through their insurance for this visit.  -Names of all parties present: Tara Conner (patient), Gerrit Heck, DO, [facility] (physician)  Tara Conner is a 73 y.o. female with a history of hypertension, hyperlipidemia, CVA (on Plavix), diabetes, referred to the Gastroenterology Clinic for evaluation of IDA.  She has a history of IDA, with most recent H/H 10.7/31.8 with normal MCV and RDW.  H/H has been stable since 10/19.  Iron deficiency diagnosed in 01/2018 with iron 30, TIBC 353, iron saturation 8%, ferritin 14.  Improved indices in 04/2018 with daily supplementation and increased dietary iron, with ferritin 70 and saturation 14%.  Normal B12 and folate.  Moved from [place] nearly 2 years ago, so prior records not available for review. Thinks she has had intermittent iron deficiency in the past, intermittently treated with iron supplementation before. Had a colonoscopy and EGD many years ago in [place] which she reports were both normal. Unclear if this was for IDA or abdominal pain (was having abdominal pain and eventually diagnosed with biliary colic, resolved w/ [facility]cy). No recent hematochezia or melena.  No known history of colon polyps.  Otherwise in her usual state of health, tolerating all p.o. intake, no n/v/d/c/f/c, weight loss, night sweats.  No known family history of CRC, GI malignancy, liver disease, pancreatic disease, or IBD.   Past medical history, past surgical history, social history, family history, medications, and allergies reviewed in the chart and with patient over the phone.  Past Medical History:   Diagnosis Date  . Diabetes mellitus without complication ([place])   . Hypertension   . Stroke [facility]) 2019     Past Surgical History:  Procedure Laterality Date  . CATARACT EXTRACTION Bilateral   . [place] possibly but not sure. Michela Pitcher it was negative  . ESOPHAGOGASTRODUODENOSCOPY     around the same time said it was negative   . GALLBLADDER SURGERY     Family History  Problem Relation Age of Onset  . Diabetes Mother   . Diabetes Sister   . Diabetes Brother   . Colon cancer Neg Hx   . Esophageal cancer Neg Hx    Social History   Tobacco Use  . Smoking status: Never Smoker  . Smokeless tobacco: Never Used  Substance Use Topics  . Alcohol use: Never    Frequency: Never  . Drug use: Never   Current Outpatient Medications  Medication Sig Dispense Refill  . amLODipine (NORVASC) 5 MG tablet TAKE 1 TABLET(5 MG) BY MOUTH DAILY 90 tablet 1  . Ascorbic Acid (VITAMIN C PO) Take 1 tablet by mouth as needed.    . Blood Glucose Monitoring Suppl (ACCU-CHEK AVIVA PLUS) w/Device KIT Use to test blood sugars 2 times daily, insurance does not cover one touch anymore. 1 kit 0  . cimetidine (TAGAMET) 300 MG tablet Take 1 tablet (300 mg total) by mouth at bedtime. (Patient taking differently: Take 300 mg by mouth as needed. ) 90 tablet 4  . clopidogrel (PLAVIX) 75 MG tablet TAKE 1 TABLET(75 MG) BY MOUTH DAILY 90 tablet 2  .  ferrous sulfate 325 (65 FE) MG tablet Take 1 tablet (325 mg total) by mouth daily with breakfast. 90 tablet 1  . gabapentin (NEURONTIN) 300 MG capsule TAKE ONE CAPSULE BY MOUTH AT NIGHT FOR 1 WEEK AND THEN INCREASE TO 1 CAPSULE TWICE DAILY AS TOLERATED (Patient taking differently: Take 300 mg by mouth daily as needed (will take 2 if needed). TAKE ONE CAPSULE BY MOUTH AT NIGHT FOR 1 WEEK AND THEN INCREASE TO 1 CAPSULE TWICE DAILY AS TOLERATED) 60 capsule 3  . glucose blood (ONE TOUCH ULTRA TEST) test strip Use to test blood sugars up to 4 times daily. 100  each 6  . Glycerin-Polysorbate 80 (REFRESH DRY EYE THERAPY OP) Place 1-2 drops into both eyes as needed (for dryness).     . insulin lispro (HUMALOG) 100 UNIT/ML injection Inject 0-0.15 mLs (0-15 Units total) into the skin 3 (three) times daily with meals. 30 mL 4  . Lancets (ACCU-CHEK SOFT TOUCH) lancets Use 4 times daily.   Patient now using prandial insulin. 100 each PRN  . LANTUS 100 UNIT/ML injection INJECT 40 UNITS INTO SKIN EVERY NIGHT AT BEDTIME 40 mL 1  . Multiple Vitamins-Minerals (MULTIVITAMIN ADULT PO) Take 1 tablet by mouth daily.    Marland Kitchen NEEDLE, DISP, 30 G (B-D DISP NEEDLE 30GX1") 30G X 1" MISC To use with Lantus. 100 each 3  . VITAMIN D PO Take 1 tablet by mouth daily.     No current facility-administered medications for this visit.    Allergies  Allergen Reactions  . Penicillins     Said she was young had a reaction   . Sulfa Antibiotics Other (See Comments)    Pt unable to report reaction Said she had a rash     Review of Systems: All systems reviewed and negative except where noted in HPI.     Physical Exam:    Physical exam not completed due to the nature of this telehealth communication.  Patient was otherwise alert and oriented and well communicative.   ASSESSMENT AND PLAN;   1) Iron Deficiency Anemia: Discussed etiology for IDA at length, to include occult GI blood loss, malabsorption, and poor dietary iron intake.  Her diet is otherwise rich in iron-containing foods, so low suspicion for dietary etiology, and improved iron indices with oral iron supplementation, so less suspicious for malabsorption.  Will evaluate as below:  - EGD with duodenal biopsies - Colonoscopy - Resume iron therapy as currently prescribed - Stop iron 7 days prior to colonoscopy - Depending on EGD findings, can consider Celiac serologies - We will send vitamin D to check for co-malabsorption.  We will hold off on additional fat-soluble vitamins at this time pending results - If  the above evaluation is unrevealing, will likely proceed with VCE for small bowel interrogation versus cross-sectional imaging - To follow-up in the GI clinic after the above studies are complete   The indications, risks, and benefits of EGD and colonoscopy were explained to the patient in detail. Risks include but are not limited to bleeding, perforation, adverse reaction to medications, and cardiopulmonary compromise. Sequelae include but are not limited to the possibility of surgery, hositalization, and mortality. The patient verbalized understanding and wished to proceed. All questions answered, referred to scheduler and bowel prep ordered. Further recommendations pending results of the exam.   I spent a total of 30 minutes of time with the patient and reviewing chart with her. Greater than 50% of the time was spent counseling and coordinating  care.    Lavena Bullion, DO, FACG  11/02/2018, 1:32 PM   Libby Maw,*

## 2018-11-02 NOTE — Patient Instructions (Addendum)
If you are age 72 or older, your body mass index should be between 23-30. Your Body mass index is 32.11 kg/m. If this is out of the aforementioned range listed, please consider follow up with your Primary Care Provider.  If you are age 22 or younger, your body mass index should be between 19-25. Your Body mass index is 32.11 kg/m. If this is out of the aformentioned range listed, please consider follow up with your Primary Care Provider.   To help prevent the possible spread of infection to our patients, communities, and staff; we will be implementing the following measures:  As of now we are not allowing any visitors/family members to accompany you to any upcoming appointments with [facility]. If you have any concerns about this please contact our office to discuss prior to the appointment.  You have been scheduled for an endoscopy and colonoscopy. Please follow the written instructions given to you at your visit today. Please pick up your prep supplies at the pharmacy within the next 1-3 days. If you use inhalers (even only as needed), please bring them with you on the day of your procedure. Your physician has requested that you go to www.startemmi.com and enter the access code given to you at your visit today. This web site gives a general overview about your procedure. However, you should still follow specific instructions given to you by our office regarding your preparation for the procedure.  We have sent the following medications to your pharmacy for you to pick up at your convenience: Suprep  Hold your Iron for 7 days prior to Colonoscopy.  Your provider has requested that you go to the basement level for lab work at our [place] location ([address] [place]. [place] [address]) . Press "B" on the elevator. The lab is located at the first door on the left as you exit the elevator. You may go at whatever time is convienent for you. The current hours of operations are Monday-  Friday 7:30am-4:30pm.   Thank you,  Dr. Lynann Bologna

## 2018-11-08 ENCOUNTER — Telehealth: Payer: Self-pay

## 2018-11-08 NOTE — Telephone Encounter (Signed)
I called pt that her visit will be change to mychart video visit. I stated its due to COVID 19. Pt was text link to her cell phone for mychart set up. Pt knows from the link to create a user name and password for the visit on May 16 at [id number]. Pt knows to log in [place] and create her username and password and to save it. Pt gave verbal consent to do video and to file insurance. I stated to log in mychart and click appt for video.PT verbalized understanding.

## 2018-11-13 ENCOUNTER — Telehealth (INDEPENDENT_AMBULATORY_CARE_PROVIDER_SITE_OTHER): Payer: Medicare Other | Admitting: Adult Health

## 2018-11-13 ENCOUNTER — Encounter: Payer: Self-pay | Admitting: Adult Health

## 2018-11-13 DIAGNOSIS — E785 Hyperlipidemia, unspecified: Secondary | ICD-10-CM

## 2018-11-13 DIAGNOSIS — I10 Essential (primary) hypertension: Secondary | ICD-10-CM | POA: Diagnosis not present

## 2018-11-13 DIAGNOSIS — Z794 Long term (current) use of insulin: Secondary | ICD-10-CM

## 2018-11-13 DIAGNOSIS — I6381 Other cerebral infarction due to occlusion or stenosis of small artery: Secondary | ICD-10-CM

## 2018-11-13 DIAGNOSIS — E1140 Type 2 diabetes mellitus with diabetic neuropathy, unspecified: Secondary | ICD-10-CM | POA: Diagnosis not present

## 2018-11-13 NOTE — Progress Notes (Signed)
[facility] [address] [place]. [place] [address] [phone]       FOLLOW-UP NOTE  [name] Date of Birth:  01/23/1946 Medical Record Number:  [id number]   Virtual Visit via Video Note  I connected with Tara Conner on 11/13/18 at 12:45 PM EDT by a video enabled telemedicine application located remotely in my own home and verified that I am speaking with the correct person using two identifiers who was located at their own home.   I discussed the limitations of evaluation and management by telemedicine and the availability of in person appointments. The patient expressed understanding and agreed to proceed.    HPI:  11/13/18 VIRTUAL VISIT Ms. Rosenburg is a 73 year old female who is being seen today for follow up regarding stroke in 03/2018. Initially scheduled today for in office visit but due to [place] safety precautions, visit transitioned to telemedicine visit via [email or web address] with patients consent. She has been stable from a stroke standpoint with very mild gait difficulties. She believes it is mainly back to normal. She no longer needs assistive device unless going into crowded public areas. Denies any falls. She continues on plavix without bleeding or bruising. Continues on atorvastatin 81m without side effects of myalgais. Blood pressure has been stable on current medications as she does monitor at home. Glucose levels stable. She continues to follow with her PCP for management.  No concerns. Denies new or worsening stroke/TIA symptoms.     Initial visit 05/14/18 Dr. SLeonie Man  Ms. LEnyeartis a pleasant 73year old African-American lady seen today for initial office follow-up visit following hospital admission for stroke in November 2019.  History is obtained from the patient and review of electronic medical records.  I personally reviewed imaging films in PACS. HPI ( per Dr ARory Percy) LTempie Hoistan 73y.o.femaleWith PMH significant  for DM 2, HTN presented to the ED With complaints of syncope, n/v, dizziness, generalized weakness and transient left facial weakness, facial numbness. Over the past 3 -4 weeks she has had intermittent episodes of dizziness/ lightheaded, Blurry vision. She also states that her HA will begin in the nape of herneck and radiate up her neck to about the middle of her neck. This comes and goesand she is completely normal in between episodes. In the ED she had an episode of left facial numbness, and some left facial droopwitnessed by ER RN - lasted 20 min. Admitted for stroke/TIA work up.Denies taking ASA daily. MRI scan of the brain showed acute to subacute right pontine infarct.  CT angiogram of the brain and neck showed nonstenotic mild extracranial atherosclerotic changes.  Carotid Doppler was not performed.  Transthoracic echo showed normal cardiac ejection fraction without any cardiac source of emboli.  LDL cholesterol was 66 mg percent and hemoglobin A1c was elevated at 8.3.  Patient was started on dual antiplatelet therapy aspirin and Plavix and discharged home with home physical and occupational therapy which she finished and she has recently finished outpatient therapy a week ago.   She is now able to walk with a 4 pronged cane which she uses mostly for outdoors.  She can walk indoors without it.  She has still mild dragging of her leg but states she is made near complete recovery.  She is still on both aspirin and Plavix and does bruise easily.  She states her blood pressure is well controlled and today it is 128/79.  She is tolerating her Lipitor well without muscle aches and pains.  She is on Humalog insulin now and this feels her sugars are better controlled.  She is excited about traveling to [place] to be with her family for Christmas and is asking for permission for the same.    ROS:   14 system review of systems is positive for mild balance difficulties and all other systems negative  PMH:   Past Medical History:  Diagnosis Date  . Diabetes mellitus without complication ([place])   . Hypertension   . Stroke [facility]) 2019    Social History:  Social History   Socioeconomic History  . Marital status: Single    Spouse name: Not on file  . Number of children: Not on file  . Years of education: Not on file  . Highest education level: Not on file  Occupational History  . Not on file  Social Needs  . Financial resource strain: Not on file  . Food insecurity    Worry: Not on file    Inability: Not on file  . Transportation needs    Medical: Not on file    Non-medical: Not on file  Tobacco Use  . Smoking status: Never Smoker  . Smokeless tobacco: Never Used  Substance and Sexual Activity  . Alcohol use: Never    Frequency: Never  . Drug use: Never  . Sexual activity: Not Currently  Lifestyle  . Physical activity    Days per week: Not on file    Minutes per session: Not on file  . Stress: Not on file  Relationships  . Social Herbalist on phone: Not on file    Gets together: Not on file    Attends religious service: Not on file    Active member of club or organization: Not on file    Attends meetings of clubs or organizations: Not on file    Relationship status: Not on file  . Intimate partner violence    Fear of current or ex partner: Not on file    Emotionally abused: Not on file    Physically abused: Not on file    Forced sexual activity: Not on file  Other Topics Concern  . Not on file  Social History Narrative  . Not on file    Medications:   Current Outpatient Medications on File Prior to Visit  Medication Sig Dispense Refill  . amLODipine (NORVASC) 5 MG tablet TAKE 1 TABLET(5 MG) BY MOUTH DAILY 90 tablet 1  . Ascorbic Acid (VITAMIN C PO) Take 1 tablet by mouth as needed.    . Blood Glucose Monitoring Suppl (ACCU-CHEK AVIVA PLUS) w/Device KIT Use to test blood sugars 2 times daily, insurance does not cover one touch anymore. 1 kit 0  .  cimetidine (TAGAMET) 300 MG tablet Take 1 tablet (300 mg total) by mouth at bedtime. (Patient taking differently: Take 300 mg by mouth as needed. ) 90 tablet 4  . clopidogrel (PLAVIX) 75 MG tablet TAKE 1 TABLET(75 MG) BY MOUTH DAILY 90 tablet 2  . ferrous sulfate 325 (65 FE) MG tablet Take 1 tablet (325 mg total) by mouth daily with breakfast. 90 tablet 1  . gabapentin (NEURONTIN) 300 MG capsule TAKE ONE CAPSULE BY MOUTH AT NIGHT FOR 1 WEEK AND THEN INCREASE TO 1 CAPSULE TWICE DAILY AS TOLERATED (Patient taking differently: Take 300 mg by mouth daily as needed (will take 2 if needed). TAKE ONE CAPSULE BY MOUTH AT NIGHT FOR 1 WEEK AND THEN INCREASE TO 1 CAPSULE TWICE DAILY AS  TOLERATED) 60 capsule 3  . glucose blood (ONE TOUCH ULTRA TEST) test strip Use to test blood sugars up to 4 times daily. 100 each 6  . Glycerin-Polysorbate 80 (REFRESH DRY EYE THERAPY OP) Place 1-2 drops into both eyes as needed (for dryness).     . insulin lispro (HUMALOG) 100 UNIT/ML injection Inject 0-0.15 mLs (0-15 Units total) into the skin 3 (three) times daily with meals. 30 mL 4  . Lancets (ACCU-CHEK SOFT TOUCH) lancets Use 4 times daily.   Patient now using prandial insulin. 100 each PRN  . LANTUS 100 UNIT/ML injection INJECT 40 UNITS INTO SKIN EVERY NIGHT AT BEDTIME (Patient taking differently: 30 to 35 units) 40 mL 1  . Multiple Vitamins-Minerals (MULTIVITAMIN ADULT PO) Take 1 tablet by mouth daily.    Marland Kitchen NEEDLE, DISP, 30 G (B-D DISP NEEDLE 30GX1") 30G X 1" MISC To use with Lantus. 100 each 3  . VITAMIN D PO Take 1 tablet by mouth daily.     No current facility-administered medications on file prior to visit.     Allergies:   Allergies  Allergen Reactions  . Penicillins     Said she was young had a reaction   . Sulfa Antibiotics Other (See Comments)    Pt unable to report reaction Said she had a rash    Physical Exam General: well developed, well nourished, pleasant elderly AA female, seated, in no evident  distress Head: head normocephalic and atraumatic.    Neurologic Exam Mental Status: Awake and fully alert. Oriented to place and time. Recent and remote memory intact. Attention span, concentration and fund of knowledge appropriate. Mood and affect appropriate.  Cranial Nerves: Extraocular movements full without nystagmus. Hearing intact to voice. Facial sensation intact. Face, tongue, palate moves normally and symmetrically.  Shoulder shrug symmetric. Motor: No evidence of weakness per drift assessment Sensory.: intact to light touch Coordination: Rapid alternating movements normal in all extremities. Finger-to-nose and heel-to-shin performed accurately bilaterally. Gait and Station: Arises from chair without difficulty. Stance is normal. Gait demonstrates normal stride length and balance .  Reflexes: UTA    ASSESSMENT: 73 year old African-American lady with right pontine lacunar infarct due to small vessel disease in November 2019.  Vascular risk factors of diabetes, hypertension, hyperlipidemia and mild obesity.  Recovered well with residual deficit of mild gait difficulty.     PLAN: 1. Lacunar infarct : Continue clopidogrel 75 mg daily  and lipitor 38m  for secondary stroke prevention. Maintain strict control of hypertension with blood pressure goal below 130/90, diabetes with hemoglobin A1c goal below 6.5% and cholesterol with LDL cholesterol (bad cholesterol) goal below 70 mg/dL.  I also advised the patient to eat a healthy diet with plenty of whole grains, cereals, fruits and vegetables, exercise regularly with at least 30 minutes of continuous activity daily and maintain ideal body weight. 2. HTN: Advised to continue current treatment regimen.  Advised to continue to monitor at home along with continued follow-up with PCP for management 3. HLD: Advised to continue current treatment regimen along with continued follow-up with PCP for future prescribing and monitoring of lipid panel.   Prior lipid panel on 08/02/2018 showed LDL 86.  Advised patient at follow-up visit with PCP to have lipid panel rechecked and if LDL greater than 70, recommend increasing atorvastatin dosage 4. DMII: Advised to continue to monitor glucose levels at home along with continued follow-up with PCP for management and monitoring.  Patient questioning ongoing use of Humalog which was started during  hospitalization and advised her to discuss with PCP as he will manage ongoing  Follow up as needed as stable from stroke standpoint  Greater than 50% of time during this 15 minute non-face-to-face visit was spent on counseling,explanation of diagnosis, planning of further management, discussion with patient and coordination of care.  Answered all questions to patient satisfaction.  Venancio Poisson, AGNP-BC  [facility] [address] [place], [place] [address]  Phone [phone] Fax [phone] Note: This document was prepared with digital dictation and possible smart phrase technology. Any transcriptional errors that result from this process are unintentional.

## 2018-11-13 NOTE — Telephone Encounter (Signed)
I called pt to update her chart. Meds and pharmacy were updated with chart.

## 2018-11-13 NOTE — Progress Notes (Signed)
I agree with the above plan 

## 2018-12-04 ENCOUNTER — Telehealth: Payer: Self-pay | Admitting: Gastroenterology

## 2018-12-04 NOTE — Telephone Encounter (Signed)
Understood. Thank you.

## 2018-12-04 NOTE — Telephone Encounter (Signed)
Pt canceled procedures for tomorrow because she is not comfortable doing it with the Covid-19 on the rise.  She will call back at a later date to reschedule

## 2018-12-05 ENCOUNTER — Encounter: Payer: Medicare Other | Admitting: Gastroenterology

## 2019-01-10 ENCOUNTER — Other Ambulatory Visit: Payer: Self-pay | Admitting: Family Medicine

## 2019-01-10 DIAGNOSIS — I10 Essential (primary) hypertension: Secondary | ICD-10-CM

## 2019-01-15 ENCOUNTER — Other Ambulatory Visit: Payer: Self-pay | Admitting: Family Medicine

## 2019-01-23 ENCOUNTER — Telehealth: Payer: Self-pay | Admitting: Family Medicine

## 2019-01-23 NOTE — Telephone Encounter (Signed)
Tried to call pt because based on last appt Dr Ethelene Hal wanted her to return in June for a 3 month follow up, called to see if she would like set one up, had to leave a message.

## 2019-01-31 ENCOUNTER — Other Ambulatory Visit: Payer: Self-pay

## 2019-01-31 ENCOUNTER — Ambulatory Visit (INDEPENDENT_AMBULATORY_CARE_PROVIDER_SITE_OTHER): Payer: Medicare Other | Admitting: Family Medicine

## 2019-01-31 ENCOUNTER — Encounter: Payer: Self-pay | Admitting: Family Medicine

## 2019-01-31 VITALS — BP 120/76 | HR 86 | Ht 67.0 in | Wt 217.0 lb

## 2019-01-31 DIAGNOSIS — D509 Iron deficiency anemia, unspecified: Secondary | ICD-10-CM

## 2019-01-31 DIAGNOSIS — Z794 Long term (current) use of insulin: Secondary | ICD-10-CM | POA: Diagnosis not present

## 2019-01-31 DIAGNOSIS — Z23 Encounter for immunization: Secondary | ICD-10-CM

## 2019-01-31 DIAGNOSIS — E7800 Pure hypercholesterolemia, unspecified: Secondary | ICD-10-CM | POA: Diagnosis not present

## 2019-01-31 DIAGNOSIS — E1140 Type 2 diabetes mellitus with diabetic neuropathy, unspecified: Secondary | ICD-10-CM

## 2019-01-31 DIAGNOSIS — I10 Essential (primary) hypertension: Secondary | ICD-10-CM

## 2019-01-31 LAB — COMPREHENSIVE METABOLIC PANEL
ALT: 16 U/L (ref 0–35)
AST: 19 U/L (ref 0–37)
Albumin: 3.7 g/dL (ref 3.5–5.2)
Alkaline Phosphatase: 90 U/L (ref 39–117)
BUN: 20 mg/dL (ref 6–23)
CO2: 28 mEq/L (ref 19–32)
Calcium: 9.4 mg/dL (ref 8.4–10.5)
Chloride: 101 mEq/L (ref 96–112)
Creatinine, Ser: 1.06 mg/dL (ref 0.40–1.20)
GFR: 61.65 mL/min (ref >60.00)
Glucose, Bld: 191 mg/dL — ABNORMAL HIGH (ref 70–99)
Potassium: 3.9 mEq/L (ref 3.5–5.1)
Sodium: 137 mEq/L (ref 135–145)
Total Bilirubin: 0.4 mg/dL (ref 0.2–1.2)
Total Protein: 7.8 g/dL (ref 6.0–8.3)

## 2019-01-31 LAB — LIPID PANEL
Cholesterol: 130 mg/dL (ref 0–200)
HDL: 38.1 mg/dL — ABNORMAL LOW (ref >39.00)
LDL Cholesterol: 68 mg/dL (ref 0–99)
NonHDL: 92.21
Total CHOL/HDL Ratio: 3
Triglycerides: 123 mg/dL (ref 0.0–149.0)
VLDL: 24.6 mg/dL (ref 0.0–40.0)

## 2019-01-31 LAB — MICROALBUMIN / CREATININE URINE RATIO
Creatinine,U: 90.2 mg/dL
Microalb Creat Ratio: 3.3 mg/g (ref 0.0–30.0)
Microalb, Ur: 2.9 mg/dL — ABNORMAL HIGH (ref 0.0–1.9)

## 2019-01-31 LAB — URINALYSIS, ROUTINE W REFLEX MICROSCOPIC
Bilirubin Urine: NEGATIVE
Ketones, ur: NEGATIVE
Nitrite: NEGATIVE
Specific Gravity, Urine: 1.01 (ref 1.000–1.030)
Total Protein, Urine: NEGATIVE
Urine Glucose: NEGATIVE
Urobilinogen, UA: 0.2 (ref 0.0–1.0)
pH: 6 (ref 5.0–8.0)

## 2019-01-31 LAB — CBC
HCT: 29.9 % — ABNORMAL LOW (ref 36.0–46.0)
Hemoglobin: 10.1 g/dL — ABNORMAL LOW (ref 12.0–15.0)
MCHC: 33.9 g/dL (ref 30.0–36.0)
MCV: 83.8 fl (ref 78.0–100.0)
Platelets: 449 10*3/uL — ABNORMAL HIGH (ref 150.0–400.0)
RBC: 3.57 Mil/uL — ABNORMAL LOW (ref 3.87–5.11)
RDW: 13.3 % (ref 11.5–15.5)
WBC: 6.9 10*3/uL (ref 4.0–10.5)

## 2019-01-31 LAB — HEMOGLOBIN A1C: Hgb A1c MFr Bld: 10.4 % — ABNORMAL HIGH (ref 4.6–6.5)

## 2019-01-31 MED ORDER — AMLODIPINE BESYLATE 5 MG PO TABS: 5.0000 mg | ORAL_TABLET | Freq: Every day | ORAL | 1 refills | Status: DC

## 2019-01-31 NOTE — Progress Notes (Signed)
Established Patient Office Visit  Subjective:  Patient ID: Tara Conner, female    DOB: [birth year]  Age: 73 y.o. MRN: [id number]  CC:  Chief Complaint  Patient presents with  . Follow-up    HPI Tara Conner presents for follow-up of her hypertension, elevated cholesterol iron deficiency anemia and diabetes.  Blood pressures been well controlled with amlodipine.  She has been compliant with her 40 mg of Lipitor she tells me.  She is having no issues taking it.  She says that she has been taking her iron pills daily.  She is seeing a gastroenterologist but follow-up colonoscopy is pending.  She was scheduled for an appointment to have her teeth cleaned but heard on the TV that teeth cleaning was not that importance that she canceled that.  Her diabetes has been out of control with fasting blood sugars running between 140 and 200.  She says that she is taking 30 units of Lantus nightly but is no longer taking the preprandial Humalog.  She is all the retinal specialist back in March and has diabetic retinopathy in both eyes.  It is proliferative.  Patient refuses the high-dose flu vaccine to date but agrees to take the regular.  Past Medical History:  Diagnosis Date  . Diabetes mellitus without complication ([place])   . Hypertension   . Stroke [facility]) 2019    Past Surgical History:  Procedure Laterality Date  . CATARACT EXTRACTION Bilateral   . [place] possibly but not sure. Michela Pitcher it was negative  . ESOPHAGOGASTRODUODENOSCOPY     around the same time said it was negative   . GALLBLADDER SURGERY      Family History  Problem Relation Age of Onset  . Diabetes Mother   . Diabetes Sister   . Diabetes Brother   . Colon cancer Neg Hx   . Esophageal cancer Neg Hx     Social History   Socioeconomic History  . Marital status: Single    Spouse name: Not on file  . Number of children: Not on file  . Years of education: Not on file  . Highest education  level: Not on file  Occupational History  . Not on file  Social Needs  . Financial resource strain: Not on file  . Food insecurity    Worry: Not on file    Inability: Not on file  . Transportation needs    Medical: Not on file    Non-medical: Not on file  Tobacco Use  . Smoking status: Never Smoker  . Smokeless tobacco: Never Used  Substance and Sexual Activity  . Alcohol use: Never    Frequency: Never  . Drug use: Never  . Sexual activity: Not Currently  Lifestyle  . Physical activity    Days per week: Not on file    Minutes per session: Not on file  . Stress: Not on file  Relationships  . Social Herbalist on phone: Not on file    Gets together: Not on file    Attends religious service: Not on file    Active member of club or organization: Not on file    Attends meetings of clubs or organizations: Not on file    Relationship status: Not on file  . Intimate partner violence    Fear of current or ex partner: Not on file    Emotionally abused: Not on file    Physically abused: Not on file  Forced sexual activity: Not on file  Other Topics Concern  . Not on file  Social History Narrative  . Not on file    Outpatient Medications Prior to Visit  Medication Sig Dispense Refill  . Ascorbic Acid (VITAMIN C PO) Take 1 tablet by mouth as needed.    Marland Kitchen atorvastatin (LIPITOR) 40 MG tablet Take 1 tablet by mouth daily.    . Blood Glucose Monitoring Suppl (ACCU-CHEK AVIVA PLUS) w/Device KIT USE TO TEST BLOOD SUGAR TWICE DAILY 1 kit 1  . cimetidine (TAGAMET) 300 MG tablet Take 1 tablet (300 mg total) by mouth at bedtime. (Patient taking differently: Take 300 mg by mouth as needed. ) 90 tablet 4  . clopidogrel (PLAVIX) 75 MG tablet TAKE 1 TABLET(75 MG) BY MOUTH DAILY 90 tablet 2  . ferrous sulfate 325 (65 FE) MG tablet Take 1 tablet (325 mg total) by mouth daily with breakfast. 90 tablet 1  . gabapentin (NEURONTIN) 300 MG capsule TAKE ONE CAPSULE BY MOUTH AT NIGHT FOR 1  WEEK AND THEN INCREASE TO 1 CAPSULE TWICE DAILY AS TOLERATED (Patient taking differently: Take 300 mg by mouth daily as needed (will take 2 if needed). TAKE ONE CAPSULE BY MOUTH AT NIGHT FOR 1 WEEK AND THEN INCREASE TO 1 CAPSULE TWICE DAILY AS TOLERATED) 60 capsule 3  . glucose blood (ONE TOUCH ULTRA TEST) test strip Use to test blood sugars up to 4 times daily. 100 each 6  . Glycerin-Polysorbate 80 (REFRESH DRY EYE THERAPY OP) Place 1-2 drops into both eyes as needed (for dryness).     . insulin lispro (HUMALOG) 100 UNIT/ML injection Inject 0-0.15 mLs (0-15 Units total) into the skin 3 (three) times daily with meals. 30 mL 4  . Lancets (ACCU-CHEK SOFT TOUCH) lancets Use 4 times daily.   Patient now using prandial insulin. 100 each PRN  . LANTUS 100 UNIT/ML injection INJECT 40 UNITS INTO SKIN EVERY NIGHT AT BEDTIME (Patient taking differently: 30 to 35 units) 40 mL 1  . Multiple Vitamins-Minerals (MULTIVITAMIN ADULT PO) Take 1 tablet by mouth daily.    Marland Kitchen NEEDLE, DISP, 30 G (B-D DISP NEEDLE 30GX1") 30G X 1" MISC To use with Lantus. 100 each 3  . VITAMIN D PO Take 1 tablet by mouth daily.    Marland Kitchen amLODipine (NORVASC) 5 MG tablet TAKE 1 TABLET(5 MG) BY MOUTH DAILY 90 tablet 1  . ATORVASTATIN CALCIUM PO 40 mg.      No facility-administered medications prior to visit.     Allergies  Allergen Reactions  . Penicillins     Said she was young had a reaction   . Sulfa Antibiotics Other (See Comments)    Pt unable to report reaction Said she had a rash    ROS Review of Systems  Constitutional: Negative for chills, diaphoresis, fatigue, fever and unexpected weight change.  HENT: Negative.   Eyes: Negative for photophobia and visual disturbance.  Respiratory: Negative.   Cardiovascular: Negative.   Gastrointestinal: Negative.   Endocrine: Negative for polyphagia and polyuria.  Genitourinary: Negative.   Musculoskeletal: Positive for gait problem.  Skin: Negative for pallor and rash.   Allergic/Immunologic: Negative for immunocompromised state.  Neurological: Negative for light-headedness and headaches.  Hematological: Does not bruise/bleed easily.  Psychiatric/Behavioral: Negative.    Diabetic Foot Exam - Simple   Simple Foot Form Diabetic Foot exam was performed with the following findings: Yes 01/31/2019  1:30 PM  Visual Inspection See comments: Yes Sensation Testing Intact to touch and  monofilament testing bilaterally: Yes Pulse Check See comments: Yes Comments  Feet are planus.  Pulses are diminished.       Objective:    Physical Exam  Constitutional: She is oriented to person, place, and time. She appears well-developed and well-nourished. No distress.  HENT:  Head: Normocephalic and atraumatic.  Right Ear: External ear normal.  Left Ear: External ear normal.  Eyes: Conjunctivae are normal. Right eye exhibits no discharge. Left eye exhibits no discharge. No scleral icterus.  Neck: Neck supple. No JVD present. No tracheal deviation present. No thyromegaly present.  Cardiovascular: Normal rate, regular rhythm and normal heart sounds.  Pulses:      Dorsalis pedis pulses are 1+ on the right side and 1+ on the left side.       Posterior tibial pulses are 1+ on the right side and 1+ on the left side.  Pulmonary/Chest: Effort normal and breath sounds normal. No stridor.  Abdominal: Bowel sounds are normal.  Lymphadenopathy:    She has no cervical adenopathy.  Neurological: She is alert and oriented to person, place, and time.  Skin: Skin is warm and dry. She is not diaphoretic.  Psychiatric: She has a normal mood and affect. Her behavior is normal.    BP 120/76   Pulse 86   Ht _0  (1.702 m)   Wt 217 lb (98.4 kg)   LMP  (LMP Unknown)   SpO2 96%   BMI 33.99 kg/m  Wt Readings from Last 3 Encounters:  01/31/19 217 lb (98.4 kg)  11/02/18 205 lb (93 kg)  08/02/18 204 lb 6 oz (92.7 kg)   BP Readings from Last 3 Encounters:  01/31/19 120/76   08/02/18 120/74  07/06/18 120/72   Guideline developer:  UpToDate (see UpToDate for funding source) Date Released: June 2014  Health Maintenance Due  Topic Date Due  . Hepatitis C Screening  06-30-1945  . FOOT EXAM  01/18/1957  . OPHTHALMOLOGY EXAM  01/18/1957  . TETANUS/TDAP  01/18/1966  . MAMMOGRAM  01/18/1997  . COLONOSCOPY  01/18/1997  . DEXA SCAN  01/19/2012  . PNA vac Low Risk Adult (1 of 2 - PCV13) 01/19/2012  . HEMOGLOBIN A1C  10/07/2018  . INFLUENZA VACCINE  12/29/2018  . URINE MICROALBUMIN  01/31/2019    There are no preventive care reminders to display for this patient.  Lab Results  Component Value Date   TSH 1.22 08/02/2018   Lab Results  Component Value Date   WBC 5.9 08/02/2018   HGB 10.7 (L) 08/02/2018   HCT 31.8 (L) 08/02/2018   MCV 81.9 08/02/2018   PLT 521.0 (H) 08/02/2018   Lab Results  Component Value Date   NA 138 08/02/2018   K 4.1 08/02/2018   CO2 27 08/02/2018   GLUCOSE 114 (H) 08/02/2018   BUN 32 (H) 08/02/2018   CREATININE 1.14 08/02/2018   BILITOT 0.3 08/02/2018   ALKPHOS 83 08/02/2018   AST 17 08/02/2018   ALT 11 08/02/2018   PROT 8.0 08/02/2018   ALBUMIN 3.7 08/02/2018   CALCIUM 9.4 08/02/2018   ANIONGAP 5 04/09/2018   GFR 56.77 (L) 08/02/2018   Lab Results  Component Value Date   CHOL 145 08/02/2018   Lab Results  Component Value Date   HDL 42.50 08/02/2018   Lab Results  Component Value Date   LDLCALC 86 08/02/2018   Lab Results  Component Value Date   TRIG 85.0 08/02/2018   Lab Results  Component Value Date  CHOLHDL 3 08/02/2018   Lab Results  Component Value Date   HGBA1C 8.3 (H) 04/08/2018      Assessment & Plan:   Problem List Items Addressed This Visit      Cardiovascular and Mediastinum   Essential hypertension - Primary   Relevant Medications   atorvastatin (LIPITOR) 40 MG tablet   amLODipine (NORVASC) 5 MG tablet   Other Relevant Orders   Comprehensive metabolic panel   CBC    Urinalysis, Routine w reflex microscopic   Microalbumin / creatinine urine ratio     Endocrine   Type 2 diabetes mellitus with diabetic neuropathy, with long-term current use of insulin (HCC)   Relevant Medications   atorvastatin (LIPITOR) 40 MG tablet   Other Relevant Orders   Hemoglobin A1c     Other   Elevated LDL cholesterol level   Relevant Orders   Lipid panel   Iron deficiency anemia   Relevant Orders   CBC   Iron, TIBC and Ferritin Panel      Meds ordered this encounter  Medications  . amLODipine (NORVASC) 5 MG tablet    Sig: Take 1 tablet (5 mg total) by mouth daily.    Dispense:  90 tablet    Refill:  1    Follow-up: Return in about 3 months (around 05/02/2019).   Stressed the importance of diabetic control and follow-up with all of her treating physicians.  She will do her best to try to lose some weight.

## 2019-02-02 LAB — IRON,TIBC AND FERRITIN PANEL
%SAT: 17 % (calc) (ref 16–45)
Ferritin: 68 ng/mL (ref 16–288)
Iron: 54 ug/dL (ref 45–160)
TIBC: 311 mcg/dL (calc) (ref 250–450)

## 2019-02-05 ENCOUNTER — Ambulatory Visit: Payer: Self-pay | Admitting: Family Medicine

## 2019-02-05 DIAGNOSIS — E1140 Type 2 diabetes mellitus with diabetic neuropathy, unspecified: Secondary | ICD-10-CM

## 2019-02-05 NOTE — Telephone Encounter (Signed)
Attempted to reach pt x2.  Clinical call message:   Summary: cb questions on med    "Pt questioning which medicine she was taking for her feet to keep them from burning. She just saw the Dr and did not know she was out Does not have bottle. "   Pt has been on gabapentin 300mg  BID. Could not reach pt.  CB# [phone]

## 2019-02-06 MED ORDER — GABAPENTIN 300 MG PO CAPS: ORAL_CAPSULE | ORAL | 3 refills | Status: DC

## 2019-02-06 NOTE — Telephone Encounter (Signed)
Okay to refill? 

## 2019-02-06 NOTE — Telephone Encounter (Signed)
Okay to refill rx for pt?

## 2019-02-06 NOTE — Telephone Encounter (Signed)
I left pt a voicemail letting her know Rx has been sent in & for her to call us back with any questions.

## 2019-02-06 NOTE — Addendum Note (Signed)
Addended by: Nathanial Millman E on: 02/06/2019 02:06 PM   Modules accepted: Orders

## 2019-02-18 ENCOUNTER — Telehealth: Payer: Self-pay | Admitting: *Deleted

## 2019-02-18 ENCOUNTER — Ambulatory Visit (INDEPENDENT_AMBULATORY_CARE_PROVIDER_SITE_OTHER): Payer: Medicare Other | Admitting: Podiatry

## 2019-02-18 ENCOUNTER — Encounter: Payer: Self-pay | Admitting: Podiatry

## 2019-02-18 ENCOUNTER — Other Ambulatory Visit: Payer: Self-pay

## 2019-02-18 VITALS — BP 132/79 | HR 98 | Resp 16

## 2019-02-18 DIAGNOSIS — E1149 Type 2 diabetes mellitus with other diabetic neurological complication: Secondary | ICD-10-CM

## 2019-02-18 DIAGNOSIS — R0989 Other specified symptoms and signs involving the circulatory and respiratory systems: Secondary | ICD-10-CM

## 2019-02-18 DIAGNOSIS — Z872 Personal history of diseases of the skin and subcutaneous tissue: Secondary | ICD-10-CM | POA: Diagnosis not present

## 2019-02-18 NOTE — Telephone Encounter (Signed)
-----   Message from Trula Slade, DPM sent at 02/18/2019  2:06 PM EDT ----- Can you please order arterial studies due to decreased pulses

## 2019-02-18 NOTE — Patient Instructions (Signed)
Neuropathic Pain Neuropathic pain is pain caused by damage to the nerves that are responsible for certain sensations in your body (sensory nerves). The pain can be caused by:  Damage to the sensory nerves that send signals to your spinal cord and brain (peripheral nervous system).  Damage to the sensory nerves in your brain or spinal cord (central nervous system). Neuropathic pain can make you more sensitive to pain. Even a minor sensation can feel very painful. This is usually a long-term condition that can be difficult to treat. The type of pain differs from person to person. It may:  Start suddenly (acute), or it may develop slowly and last for a long time (chronic).  Come and go as damaged nerves heal, or it may stay at the same level for years.  Cause emotional distress, loss of sleep, and a lower quality of life. What are the causes? The most common cause of this condition is diabetes. Many other diseases and conditions can also cause neuropathic pain. Causes of neuropathic pain can be classified as:  Toxic. This is caused by medicines and chemicals. The most common cause of toxic neuropathic pain is damage from cancer treatments (chemotherapy).  Metabolic. This can be caused by: ? Diabetes. This is the most common disease that damages the nerves. ? Lack of vitamin B from long-term alcohol abuse.  Traumatic. Any injury that cuts, crushes, or stretches a nerve can cause damage and pain. A common example is feeling pain after losing an arm or leg (phantom limb pain).  Compression-related. If a sensory nerve gets trapped or compressed for a long period of time, the blood supply to the nerve can be cut off.  Vascular. Many blood vessel diseases can cause neuropathic pain by decreasing blood supply and oxygen to nerves.  Autoimmune. This type of pain results from diseases in which the body's defense system (immune system) mistakenly attacks sensory nerves. Examples of autoimmune diseases  that can cause neuropathic pain include lupus and multiple sclerosis.  Infectious. Many types of viral infections can damage sensory nerves and cause pain. Shingles infection is a common cause of this type of pain.  Inherited. Neuropathic pain can be a symptom of many diseases that are passed down through families (genetic). What increases the risk? You are more likely to develop this condition if:  You have diabetes.  You smoke.  You drink too much alcohol.  You are taking certain medicines, including medicines that kill cancer cells (chemotherapy) or that treat immune system disorders. What are the signs or symptoms? The main symptom is pain. Neuropathic pain is often described as:  Burning.  Shock-like.  Stinging.  Hot or cold.  Itching. How is this diagnosed? No single test can diagnose neuropathic pain. It is diagnosed based on:  Physical exam and your symptoms. Your health care provider will ask you about your pain. You may be asked to use a pain scale to describe how bad your pain is.  Tests. These may be done to see if you have a high sensitivity to pain and to help find the cause and location of any sensory nerve damage. They include: ? Nerve conduction studies to test how well nerve signals travel through your sensory nerves (electrodiagnostic testing). ? Stimulating your sensory nerves through electrodes on your skin and measuring the response in your spinal cord and brain (somatosensory evoked potential).  Imaging studies, such as: ? X-rays. ? CT scan. ? MRI. How is this treated? Treatment for neuropathic pain may change  over time. You may need to try different treatment options or a combination of treatments. Some options include:  Treating the underlying cause of the neuropathy, such as diabetes, kidney disease, or vitamin deficiencies.  Stopping medicines that can cause neuropathy, such as chemotherapy.  Medicine to relieve pain. Medicines may  include: ? Prescription or over-the-counter pain medicine. ? Anti-seizure medicine. ? Antidepressant medicines. ? Pain-relieving patches that are applied to painful areas of skin. ? A medicine to numb the area (local anesthetic), which can be injected as a nerve block.  Transcutaneous nerve stimulation. This uses electrical currents to block painful nerve signals. The treatment is painless.  Alternative treatments, such as: ? Acupuncture. ? Meditation. ? Massage. ? Physical therapy. ? Pain management programs. ? Counseling. Follow these instructions at home: Medicines   Take over-the-counter and prescription medicines only as told by your health care provider.  Do not drive or use heavy machinery while taking prescription pain medicine.  If you are taking prescription pain medicine, take actions to prevent or treat constipation. Your health care provider may recommend that you: ? Drink enough fluid to keep your urine pale yellow. ? Eat foods that are high in fiber, such as fresh fruits and vegetables, whole grains, and beans. ? Limit foods that are high in fat and processed sugars, such as fried or sweet foods. ? Take an over-the-counter or prescription medicine for constipation. Lifestyle   Have a good support system at home.  Consider joining a chronic pain support group.  Do not use any products that contain nicotine or tobacco, such as cigarettes and e-cigarettes. If you need help quitting, ask your health care provider.  Do not drink alcohol. General instructions  Learn as much as you can about your condition.  Work closely with all your health care providers to find the treatment plan that works best for you.  Ask your health care provider what activities are safe for you.  Keep all follow-up visits as told by your health care provider. This is important. Contact a health care provider if:  Your pain treatments are not working.  You are having side effects  from your medicines.  You are struggling with tiredness (fatigue), mood changes, depression, or anxiety. Summary  Neuropathic pain is pain caused by damage to the nerves that are responsible for certain sensations in your body (sensory nerves).  Neuropathic pain may come and go as damaged nerves heal, or it may stay at the same level for years.  Neuropathic pain is usually a long-term condition that can be difficult to treat. Consider joining a chronic pain support group. This information is not intended to replace advice given to you by your health care provider. Make sure you discuss any questions you have with your health care provider. Document Released: 02/11/2004 Document Revised: 09/06/2018 Document Reviewed: 06/02/2017 Elsevier Patient Education  [place]. Diabetes Mellitus and [place] care is an important part of your health, especially when you have diabetes. Diabetes may cause you to have problems because of poor blood flow (circulation) to your feet and legs, which can cause your skin to:  Become thinner and drier.  Break more easily.  Heal more slowly.  Peel and crack. You may also have nerve damage (neuropathy) in your legs and feet, causing decreased feeling in them. This means that you may not notice minor injuries to your feet that could lead to more serious problems. Noticing and addressing any potential problems early is the best way  to prevent future foot problems. How to care for your feet Foot hygiene  Wash your feet daily with warm water and mild soap. Do not use hot water. Then, pat your feet and the areas between your toes until they are completely dry. Do not soak your feet as this can dry your skin.  Trim your toenails straight across. Do not dig under them or around the cuticle. File the edges of your nails with an emery board or nail file.  Apply a moisturizing lotion or petroleum jelly to the skin on your feet and to dry, brittle toenails.  Use lotion that does not contain alcohol and is unscented. Do not apply lotion between your toes. Shoes and socks  Wear clean socks or stockings every day. Make sure they are not too tight. Do not wear knee-high stockings since they may decrease blood flow to your legs.  Wear shoes that fit properly and have enough cushioning. Always look in your shoes before you put them on to be sure there are no objects inside.  To break in new shoes, wear them for just a few hours a day. This prevents injuries on your feet. Wounds, scrapes, corns, and calluses  Check your feet daily for blisters, cuts, bruises, sores, and redness. If you cannot see the bottom of your feet, use a mirror or ask someone for help.  Do not cut corns or calluses or try to remove them with medicine.  If you find a minor scrape, cut, or break in the skin on your feet, keep it and the skin around it clean and dry. You may clean these areas with mild soap and water. Do not clean the area with peroxide, alcohol, or iodine.  If you have a wound, scrape, corn, or callus on your foot, look at it several times a day to make sure it is healing and not infected. Check for: ? Redness, swelling, or pain. ? Fluid or blood. ? Warmth. ? Pus or a bad smell. General instructions  Do not cross your legs. This may decrease blood flow to your feet.  Do not use heating pads or hot water bottles on your feet. They may burn your skin. If you have lost feeling in your feet or legs, you may not know this is happening until it is too late.  Protect your feet from hot and cold by wearing shoes, such as at the beach or on hot pavement.  Schedule a complete foot exam at least once a year (annually) or more often if you have foot problems. If you have foot problems, report any cuts, sores, or bruises to your health care provider immediately. Contact a health care provider if:  You have a medical condition that increases your risk of infection and you  have any cuts, sores, or bruises on your feet.  You have an injury that is not healing.  You have redness on your legs or feet.  You feel burning or tingling in your legs or feet.  You have pain or cramps in your legs and feet.  Your legs or feet are numb.  Your feet always feel cold.  You have pain around a toenail. Get help right away if:  You have a wound, scrape, corn, or callus on your foot and: ? You have pain, swelling, or redness that gets worse. ? You have fluid or blood coming from the wound, scrape, corn, or callus. ? Your wound, scrape, corn, or callus feels warm to the touch. ?  You have pus or a bad smell coming from the wound, scrape, corn, or callus. ? You have a fever. ? You have a red line going up your leg. Summary  Check your feet every day for cuts, sores, red spots, swelling, and blisters.  Moisturize feet and legs daily.  Wear shoes that fit properly and have enough cushioning.  If you have foot problems, report any cuts, sores, or bruises to your health care provider immediately.  Schedule a complete foot exam at least once a year (annually) or more often if you have foot problems. This information is not intended to replace advice given to you by your health care provider. Make sure you discuss any questions you have with your health care provider. Document Released: 05/13/2000 Document Revised: 06/28/2017 Document Reviewed: 06/17/2016 Elsevier Patient Education  2020 [employer].

## 2019-02-19 DIAGNOSIS — Z872 Personal history of diseases of the skin and subcutaneous tissue: Secondary | ICD-10-CM | POA: Insufficient documentation

## 2019-02-19 NOTE — Telephone Encounter (Signed)
[facility] - MEDICAID NOTIFICATION STATEMENT, "THIS  MEDICAID MEMBER DOES NOT REQUIRE PRIOR AUTHORIZATION FOR OUTPATIENT RADIOLOGY THROUGH [facility] OR  [facility] AT THIS TIME." Faxed to [facility]. 

## 2019-02-19 NOTE — Progress Notes (Signed)
Subjective:   Patient ID: Tara Conner, female   DOB: 73 y.o.   MRN: [id number]   HPI 73 year old female presents the office today for concerns of numbness, burning, tingling to her feet which been ongoing for the last 1 year.  She states that it was getting worse and she was recently started on gabapentin which is been very helpful.  She still gets some symptoms but not as significant.  She does have a history of a foot ulcer while she was living in [place] but this did heal.  She is diabetic and last A1c was 10.4.   Review of Systems  All other systems reviewed and are negative.  Past Medical History:  Diagnosis Date  . Diabetes mellitus without complication ([place])   . Hypertension   . Stroke [facility]) 2019    Past Surgical History:  Procedure Laterality Date  . CATARACT EXTRACTION Bilateral   . [place] possibly but not sure. Michela Pitcher it was negative  . ESOPHAGOGASTRODUODENOSCOPY     around the same time said it was negative   . GALLBLADDER SURGERY       Current Outpatient Medications:  .  amLODipine (NORVASC) 5 MG tablet, Take 1 tablet (5 mg total) by mouth daily., Disp: 90 tablet, Rfl: 1 .  Ascorbic Acid (VITAMIN C PO), Take 1 tablet by mouth as needed., Disp: , Rfl:  .  atorvastatin (LIPITOR) 40 MG tablet, Take 1 tablet by mouth daily., Disp: , Rfl:  .  Blood Glucose Monitoring Suppl (ACCU-CHEK AVIVA PLUS) w/Device KIT, USE TO TEST BLOOD SUGAR TWICE DAILY, Disp: 1 kit, Rfl: 1 .  cimetidine (TAGAMET) 300 MG tablet, Take 1 tablet (300 mg total) by mouth at bedtime. (Patient taking differently: Take 300 mg by mouth as needed. ), Disp: 90 tablet, Rfl: 4 .  clopidogrel (PLAVIX) 75 MG tablet, TAKE 1 TABLET(75 MG) BY MOUTH DAILY, Disp: 90 tablet, Rfl: 2 .  ferrous sulfate 325 (65 FE) MG tablet, Take 1 tablet (325 mg total) by mouth daily with breakfast., Disp: 90 tablet, Rfl: 1 .  gabapentin (NEURONTIN) 300 MG capsule, TAKE ONE CAPSULE BY MOUTH AT NIGHT FOR 1  WEEK AND THEN INCREASE TO 1 CAPSULE TWICE DAILY AS TOLERATED, Disp: 60 capsule, Rfl: 3 .  glucose blood (ONE TOUCH ULTRA TEST) test strip, Use to test blood sugars up to 4 times daily., Disp: 100 each, Rfl: 6 .  Glycerin-Polysorbate 80 (REFRESH DRY EYE THERAPY OP), Place 1-2 drops into both eyes as needed (for dryness). , Disp: , Rfl:  .  insulin lispro (HUMALOG) 100 UNIT/ML injection, Inject 0-0.15 mLs (0-15 Units total) into the skin 3 (three) times daily with meals., Disp: 30 mL, Rfl: 4 .  Lancets (ACCU-CHEK SOFT TOUCH) lancets, Use 4 times daily.   Patient now using prandial insulin., Disp: 100 each, Rfl: PRN .  LANTUS 100 UNIT/ML injection, INJECT 40 UNITS INTO SKIN EVERY NIGHT AT BEDTIME (Patient taking differently: 30 to 35 units), Disp: 40 mL, Rfl: 1 .  Multiple Vitamins-Minerals (MULTIVITAMIN ADULT PO), Take 1 tablet by mouth daily., Disp: , Rfl:  .  NEEDLE, DISP, 30 G (B-D DISP NEEDLE 30GX1") 30G X 1" MISC, To use with Lantus., Disp: 100 each, Rfl: 3 .  VITAMIN D PO, Take 1 tablet by mouth daily., Disp: , Rfl:   Allergies  Allergen Reactions  . Penicillins     Said she was young had a reaction   . Sulfa Antibiotics Other (See Comments)  Pt unable to report reaction Said she had a rash          Objective:  Physical Exam  General: AAO x3, NAD  Dermatological: Skin is warm, dry and supple bilateral.  There are no open sores, no preulcerative lesions, no rash or signs of infection present.  Vascular: DP/PT pulses decreased. CRT < 3 seconds.  No pain with calf compression, swelling, warmth.  No erythema.  Neruologic: Sensation decreased to Semmes-Weinstein monofilament.  Musculoskeletal: No gross boney pedal deformities bilateral. No pain, crepitus, or limitation noted with foot and ankle range of motion bilateral. Muscular strength 5/5 in all groups tested bilateral.     Assessment:   Uncontrolled type 2 diabetes with neuropathy, decreased pedal pulse     Plan:   -Treatment options discussed including all alternatives, risks, and complications -Etiology of symptoms were discussed -We will continue current dose of gabapentin as this is been helpful for her.  Would order arterial studies given decreased pulses.  Discussed importance of daily foot inspection.  Return in about 3 months (around 05/20/2019).  Trula Slade DPM

## 2019-03-05 ENCOUNTER — Other Ambulatory Visit: Payer: Self-pay

## 2019-03-05 ENCOUNTER — Ambulatory Visit (HOSPITAL_COMMUNITY)
Admission: RE | Admit: 2019-03-05 | Discharge: 2019-03-05 | Disposition: A | Discharge status: Home / Self Care | Payer: Medicare Other | Source: Ambulatory Visit | Attending: Cardiovascular Disease | Admitting: Cardiovascular Disease

## 2019-03-05 DIAGNOSIS — R0989 Other specified symptoms and signs involving the circulatory and respiratory systems: Secondary | ICD-10-CM | POA: Diagnosis not present

## 2019-03-06 NOTE — Progress Notes (Signed)
[facility] Note  03/08/2019     CHIEF COMPLAINT Patient presents for Retina Follow Up   HISTORY OF PRESENT ILLNESS: Tara Conner is a 73 y.o. female who presents to the clinic today for:   HPI    Retina Follow Up    Patient presents with  Diabetic Retinopathy.  In both eyes.  This started 7 months ago.  Severity is moderate.  Duration of 7 months.  Since onset it is stable.  I, the attending physician,  performed the HPI with the patient and updated documentation appropriately.          Comments    73 y/o female pt here for 7 mo f/u for PDR OU.  Missed earlier f/u due to threat of covid-19.  No change in [place] OU.  Denies pain, flashes, floaters.  OS waters sometimes.  AT prn OU.  BS 122 yesterday.  A1C 9.0.       Last edited by Bernarda Caffey, MD on 03/09/2019 10:05 AM. (History)    pt states she feels like her vision has gotten better over the past month, she states she has been working on keeping her sugar under control   Referring physician: Libby Maw, MD [place],  [place] [id number]  HISTORICAL INFORMATION:   Selected notes from the MEDICAL RECORD NUMBER Referred by Dr. Abelino Derrick for DM exam LEE:  Ocular Hx- PMH-DM (A1C: 8.3 takes Lantus), HTN, stroke    CURRENT MEDICATIONS: Current Outpatient Medications (Ophthalmic Drugs)  Medication Sig  . Glycerin-Polysorbate 80 (REFRESH DRY EYE THERAPY OP) Place 1-2 drops into both eyes as needed (for dryness).    No current facility-administered medications for this visit.  (Ophthalmic Drugs)   Current Outpatient Medications (Other)  Medication Sig  . amLODipine (NORVASC) 5 MG tablet Take 1 tablet (5 mg total) by mouth daily.  . Ascorbic Acid (VITAMIN C PO) Take 1 tablet by mouth as needed.  Marland Kitchen atorvastatin (LIPITOR) 40 MG tablet Take 1 tablet by mouth daily.  . Blood Glucose Monitoring Suppl (ACCU-CHEK AVIVA PLUS) w/Device KIT USE TO TEST BLOOD SUGAR  TWICE DAILY  . cimetidine (TAGAMET) 300 MG tablet Take 1 tablet (300 mg total) by mouth at bedtime. (Patient taking differently: Take 300 mg by mouth as needed. )  . clopidogrel (PLAVIX) 75 MG tablet TAKE 1 TABLET(75 MG) BY MOUTH DAILY  . ferrous sulfate 325 (65 FE) MG tablet Take 1 tablet (325 mg total) by mouth daily with breakfast.  . gabapentin (NEURONTIN) 300 MG capsule TAKE ONE CAPSULE BY MOUTH AT NIGHT FOR 1 WEEK AND THEN INCREASE TO 1 CAPSULE TWICE DAILY AS TOLERATED  . glucose blood (ONE TOUCH ULTRA TEST) test strip Use to test blood sugars up to 4 times daily.  . insulin lispro (HUMALOG) 100 UNIT/ML injection Inject 0-0.15 mLs (0-15 Units total) into the skin 3 (three) times daily with meals.  . Lancets (ACCU-CHEK SOFT TOUCH) lancets Use 4 times daily.   Patient now using prandial insulin.  Marland Kitchen LANTUS 100 UNIT/ML injection INJECT 40 UNITS INTO SKIN EVERY NIGHT AT BEDTIME (Patient taking differently: 30 to 35 units)  . Multiple Vitamins-Minerals (MULTIVITAMIN ADULT PO) Take 1 tablet by mouth daily.  Marland Kitchen NEEDLE, DISP, 30 G (B-D DISP NEEDLE 30GX1") 30G X 1" MISC To use with Lantus.  Marland Kitchen VITAMIN D PO Take 1 tablet by mouth daily.   No current facility-administered medications for this visit.  (Other)  REVIEW OF SYSTEMS: ROS    Positive for: Gastrointestinal, Neurological, Endocrine, Eyes   Negative for: Constitutional, Skin, Genitourinary, Musculoskeletal, HENT, Cardiovascular, Respiratory, Psychiatric, Allergic/Imm, Heme/Lymph   Last edited by Baxley, Andrew G, COA on 03/08/2019  2:19 PM. (History)       ALLERGIES Allergies  Allergen Reactions  . Penicillins     Said she was young had a reaction   . Sulfa Antibiotics Other (See Comments)    Pt unable to report reaction Said she had a rash    PAST MEDICAL HISTORY Past Medical History:  Diagnosis Date  . Diabetes mellitus without complication (HCC)   . Diabetic retinopathy (HCC)    PDR OU  . Hypertension   .  Hypertensive retinopathy    OU  . Stroke ([facility]) 2019   Past Surgical History:  Procedure Laterality Date  . CATARACT EXTRACTION Bilateral   . COLONOSCOPY     [place] possibly but not sure. Said it was negative  . ESOPHAGOGASTRODUODENOSCOPY     around the same time said it was negative   . EYE SURGERY Bilateral    Cat Sx OU  . GALLBLADDER SURGERY      FAMILY HISTORY Family History  Problem Relation Age of Onset  . Diabetes Mother   . Diabetes Sister   . Diabetes Brother   . Colon cancer Neg Hx   . Esophageal cancer Neg Hx     SOCIAL HISTORY Social History   Tobacco Use  . Smoking status: Never Smoker  . Smokeless tobacco: Never Used  Substance Use Topics  . Alcohol use: Never    Frequency: Never  . Drug use: Never         OPHTHALMIC EXAM:  Base Eye Exam    Visual Acuity (Snellen - Linear)      Right Left   Dist [place] 20/50 20/25 -2   Dist ph [place] 20/30 20/25       Tonometry (Tonopen, 2:22 PM)      Right Left   Pressure 17 14       Pupils      Dark Light Shape React APD   Right 3 2 Round Slow None   Left 3 2 Round Slow None       Visual Fields (Counting fingers)      Left Right    Full Full       Extraocular Movement      Right Left    Full, Ortho Full, Ortho       Neuro/Psych    Oriented x3: Yes   Mood/Affect: Normal       Dilation    Both eyes: 1.0% Mydriacyl, 2.5% Phenylephrine @ 2:22 PM        Slit Lamp and Fundus Exam    Slit Lamp Exam      Right Left   Lids/Lashes Dermatochalasis - upper lid, Meibomian gland dysfunction Dermatochalasis - upper lid, Meibomian gland dysfunction   Conjunctiva/Sclera Mild nasal Pinguecula, mild Melanosis nasal Pinguecula, mild Melanosis   Cornea Arcus, 1-2+ Punctate epithelial erosions, Debris in tear film Arcus, 2-3+ Punctate epithelial erosions, Debris in tear film   Anterior Chamber Deep and quiet Deep and quiet   Iris Round and dilated, No NVI Round and dilated, No NVI   Lens PC IOL in  good position PC IOL in good position, 3+white PCO   Vitreous mild Vitreous syneresis mild Vitreous syneresis       Fundus Exam      Right   Left   Disc Sharp rim, 2+ Pallor trace Pallor, Sharp rim   C/D Ratio 0.7 0.6   Macula Flat, Blunted foveal reflex, mild Retinal pigment epithelial mottling Flat, Blunted foveal reflex, Epiretinal membrane, temporal Focal laser scars, scattered MA   Vessels Vascular attenuation, +sclerotic arterioles Vascular attenuation, early sclerosis/sheathing, AV crossing changes, Tortuous   Periphery Attached, 360 PRP -- mild room for fill in Attached, 360 PRP -- room for fill in peripherally          IMAGING AND PROCEDURES  Imaging and Procedures for @TODAY@  OCT, Retina - OU - Both Eyes       Right Eye Quality was good. Central Foveal Thickness: 256. Progression has been stable. Findings include normal foveal contour, no IRF, no SRF (diffuse atrophy and retinal thinning, inferior greater than superior macula, trace ERM, partial PVD).   Left Eye Quality was borderline. Central Foveal Thickness: 265. Progression has been stable. Findings include normal foveal contour, no SRF, no IRF (Trace ERM).   Notes *Images captured and stored on drive  Diagnosis / Impression:  No DME OU Diffuse atrophy OD   Clinical management:  See below  Abbreviations: NFP - Normal foveal profile. CME - cystoid macular edema. PED - pigment epithelial detachment. IRF - intraretinal fluid. SRF - subretinal fluid. EZ - ellipsoid zone. ERM - epiretinal membrane. ORA - outer retinal atrophy. ORT - outer retinal tubulation. SRHM - subretinal hyper-reflective material                 ASSESSMENT/PLAN:    ICD-10-CM   1. Proliferative diabetic retinopathy of both eyes without macular edema associated with type 2 diabetes mellitus (HCC)  E11.3593   2. Retinal ischemia due to type 2 diabetes mellitus (HCC)  E11.39    H34.9   3. Retinal edema  H35.81 OCT, Retina - OU - Both  Eyes  4. Essential hypertension  I10   5. Hypertensive retinopathy of both eyes  H35.033   6. Pseudophakia of both eyes  Z96.1     1-3. Proliferative diabetic retinopathy w/o DME, OU (OD > OS)  - history of PRP OU in [place] -- moved here appr. 2 years   - exam shows ischemic looking macula with sclerotic vessels and flat fibrotic NV OU  - FA 03.11.20 shows patches of residual NVE with active leakage OU (OD > OS)  - today, delayed f/u due to COVID restrictions -- was supposed to return in April 2020 for PRP fill in OU (OD first)  - OCT without diabetic macular edema, both eyes -- but diffuse atrophy OU  - BCVA 20/30 OD, 20/25 OU  - exam and vision relatively stable despite delayed f/u  - f/u in 6 weeks -- DFE/OCT and likely PRP fill in OD  4,5. Hypertensive retinopathy OU  - discussed importance of tight BP control  - monitor  6. Pseudophakia OU  - s/p CE/IOL OU - 20 years ago, [place]  - doing well  - OS with significant fibrotic PCO  - monitor   Ophthalmic Meds Ordered this visit:  No orders of the defined types were placed in this encounter.      Return in about 6 weeks (around 04/19/2019) for f/u PDR OU, DFE, OCT.  There are no Patient Instructions on file for this visit.   Explained the diagnoses, plan, and follow up with the patient and they expressed understanding.  Patient expressed understanding of the importance of proper follow up care.   This document   serves as a record of services personally performed by Gardiner Sleeper, MD, PhD. It was created on their behalf by Ernest Mallick, OA, an ophthalmic assistant. The creation of this record is the provider's dictation and/or activities during the visit.    Electronically signed by: Ernest Mallick, OA 10.07.2020 10:11 AM  Gardiner Sleeper, M.D., Ph.D. Diseases & Surgery of the Retina and Vitreous [facility]  I have reviewed the above documentation for accuracy and completeness, and I agree with  the above. Gardiner Sleeper, M.D., Ph.D. 03/09/19 10:11 AM     Abbreviations: M myopia (nearsighted); A astigmatism; H hyperopia (farsighted); P presbyopia; Mrx spectacle prescription;  CTL contact lenses; OD right eye; OS left eye; OU both eyes  XT exotropia; ET esotropia; PEK punctate epithelial keratitis; PEE punctate epithelial erosions; DES dry eye syndrome; MGD meibomian gland dysfunction; ATs artificial tears; PFAT's preservative free artificial tears; [place] nuclear sclerotic cataract; PSC posterior subcapsular cataract; ERM epi-retinal membrane; PVD posterior vitreous detachment; RD retinal detachment; DM diabetes mellitus; DR diabetic retinopathy; NPDR non-proliferative diabetic retinopathy; PDR proliferative diabetic retinopathy; CSME clinically significant macular edema; DME diabetic macular edema; dbh dot blot hemorrhages; CWS cotton wool spot; POAG primary open angle glaucoma; C/D cup-to-disc ratio; HVF humphrey visual field; GVF goldmann visual field; OCT optical coherence tomography; IOP intraocular pressure; BRVO Branch retinal vein occlusion; CRVO central retinal vein occlusion; CRAO central retinal artery occlusion; BRAO branch retinal artery occlusion; RT retinal tear; SB scleral buckle; PPV pars plana vitrectomy; VH Vitreous hemorrhage; PRP panretinal laser photocoagulation; IVK intravitreal kenalog; VMT vitreomacular traction; MH Macular hole;  NVD neovascularization of the disc; NVE neovascularization elsewhere; AREDS age related eye disease study; ARMD age related macular degeneration; POAG primary open angle glaucoma; EBMD epithelial/anterior basement membrane dystrophy; ACIOL anterior chamber intraocular lens; IOL intraocular lens; PCIOL posterior chamber intraocular lens; Phaco/IOL phacoemulsification with intraocular lens placement; [place] photorefractive keratectomy; LASIK laser assisted in situ keratomileusis; HTN hypertension; DM diabetes mellitus; COPD chronic obstructive pulmonary  disease

## 2019-03-08 ENCOUNTER — Other Ambulatory Visit: Payer: Self-pay

## 2019-03-08 ENCOUNTER — Encounter (INDEPENDENT_AMBULATORY_CARE_PROVIDER_SITE_OTHER): Payer: Self-pay | Admitting: Ophthalmology

## 2019-03-08 ENCOUNTER — Ambulatory Visit (INDEPENDENT_AMBULATORY_CARE_PROVIDER_SITE_OTHER): Payer: Medicare Other | Admitting: Ophthalmology

## 2019-03-08 DIAGNOSIS — E1139 Type 2 diabetes mellitus with other diabetic ophthalmic complication: Secondary | ICD-10-CM

## 2019-03-08 DIAGNOSIS — Z961 Presence of intraocular lens: Secondary | ICD-10-CM

## 2019-03-08 DIAGNOSIS — H3581 Retinal edema: Secondary | ICD-10-CM | POA: Diagnosis not present

## 2019-03-08 DIAGNOSIS — H35033 Hypertensive retinopathy, bilateral: Secondary | ICD-10-CM | POA: Diagnosis not present

## 2019-03-08 DIAGNOSIS — I10 Essential (primary) hypertension: Secondary | ICD-10-CM | POA: Diagnosis not present

## 2019-03-08 DIAGNOSIS — E113593 Type 2 diabetes mellitus with proliferative diabetic retinopathy without macular edema, bilateral: Secondary | ICD-10-CM

## 2019-03-08 DIAGNOSIS — H349 Unspecified retinal vascular occlusion: Secondary | ICD-10-CM

## 2019-03-09 ENCOUNTER — Encounter (INDEPENDENT_AMBULATORY_CARE_PROVIDER_SITE_OTHER): Payer: Self-pay | Admitting: Ophthalmology

## 2019-03-11 NOTE — Progress Notes (Signed)
Called and spoke with the patient and relayed the message stating that the circulation test was normal and the patient stated that she was doing the massages and trying to get her diabetes in order and I stated to call the [place] office if any concerns or questions at [phone]. Tara Conner

## 2019-04-15 ENCOUNTER — Other Ambulatory Visit: Payer: Self-pay

## 2019-04-15 MED ORDER — CLOPIDOGREL BISULFATE 75 MG PO TABS: ORAL_TABLET | ORAL | 1 refills | Status: DC

## 2019-04-16 ENCOUNTER — Other Ambulatory Visit: Payer: Self-pay

## 2019-04-16 MED ORDER — ATORVASTATIN CALCIUM 40 MG PO TABS: 40.0000 mg | ORAL_TABLET | Freq: Every day | ORAL | 2 refills | Status: DC

## 2019-04-22 ENCOUNTER — Encounter (INDEPENDENT_AMBULATORY_CARE_PROVIDER_SITE_OTHER): Payer: Medicare Other | Admitting: Ophthalmology

## 2019-05-14 NOTE — Progress Notes (Signed)
Virtual Visit via Video Note  I connected with patient on 05/15/19 at 11:00 AM EST by audio enabled telemedicine application and verified that I am speaking with the correct person using two identifiers.   THIS ENCOUNTER IS A VIRTUAL VISIT DUE TO COVID-19 - PATIENT WAS NOT SEEN IN THE OFFICE. PATIENT HAS CONSENTED TO VIRTUAL VISIT / TELEMEDICINE VISIT   Location of patient: home  Location of provider: office  I discussed the limitations of evaluation and management by telemedicine and the availability of in person appointments. The patient expressed understanding and agreed to proceed.   Subjective:   Tara Conner is a 73 y.o. female who presents for an Initial Medicare Annual Wellness Visit.  The Patient was informed that the wellness visit is to identify future health risk and educate and initiate measures that can reduce risk for increased disease through the lifespan.   Describes health as fair, good or great? Good!   Review of Systems     Home Safety/Smoke Alarms: Feels safe in home. Smoke alarms in place.  Lives alone in 2 story home. Tries to avoid using stairs. Stays on 1st floor mostly.   Female:        Mammo- declines today due to Covid 19      Dexa scan-   declines today due to Covid 19       CCS-declines today due to Covid 19      Objective:     Advanced Directives 05/15/2019 04/07/2018 04/07/2018 03/27/2018  Does Patient Have a Medical Advance Directive? No No No No  Would patient like information on creating a medical advance directive? No - Patient declined No - Patient declined Yes (ED - Information included in AVS) -    Current Medications (verified) Outpatient Encounter Medications as of 05/15/2019  Medication Sig  . amLODipine (NORVASC) 5 MG tablet Take 1 tablet (5 mg total) by mouth daily.  . Ascorbic Acid (VITAMIN C PO) Take 1 tablet by mouth as needed.  Marland Kitchen atorvastatin (LIPITOR) 40 MG tablet Take 1 tablet (40 mg total) by mouth daily.  . Blood  Glucose Monitoring Suppl (ACCU-CHEK AVIVA PLUS) w/Device KIT USE TO TEST BLOOD SUGAR TWICE DAILY  . cimetidine (TAGAMET) 300 MG tablet Take 1 tablet (300 mg total) by mouth at bedtime. (Patient taking differently: Take 300 mg by mouth as needed. )  . clopidogrel (PLAVIX) 75 MG tablet TAKE 1 TABLET(75 MG) BY MOUTH DAILY  . ferrous sulfate 325 (65 FE) MG tablet Take 1 tablet (325 mg total) by mouth daily with breakfast.  . gabapentin (NEURONTIN) 300 MG capsule TAKE ONE CAPSULE BY MOUTH AT NIGHT FOR 1 WEEK AND THEN INCREASE TO 1 CAPSULE TWICE DAILY AS TOLERATED  . glucose blood (ONE TOUCH ULTRA TEST) test strip Use to test blood sugars up to 4 times daily.  . Glycerin-Polysorbate 80 (REFRESH DRY EYE THERAPY OP) Place 1-2 drops into both eyes as needed (for dryness).   . insulin lispro (HUMALOG) 100 UNIT/ML injection Inject 0-0.15 mLs (0-15 Units total) into the skin 3 (three) times daily with meals.  . Lancets (ACCU-CHEK SOFT TOUCH) lancets Use 4 times daily.   Patient now using prandial insulin.  Marland Kitchen LANTUS 100 UNIT/ML injection INJECT 40 UNITS INTO SKIN EVERY NIGHT AT BEDTIME (Patient taking differently: 30 to 35 units)  . Multiple Vitamins-Minerals (MULTIVITAMIN ADULT PO) Take 1 tablet by mouth daily.  Marland Kitchen NEEDLE, DISP, 30 G (B-D DISP NEEDLE 30GX1") 30G X 1" MISC To use with Lantus.  Marland Kitchen  VITAMIN D PO Take 1 tablet by mouth daily.   No facility-administered encounter medications on file as of 05/15/2019.    Allergies (verified) Penicillins and Sulfa antibiotics   History: Past Medical History:  Diagnosis Date  . Diabetes mellitus without complication ([place])   . Diabetic retinopathy ([place])    PDR OU  . Hypertension   . Hypertensive retinopathy    OU  . Stroke [facility]) 2019   Past Surgical History:  Procedure Laterality Date  . CATARACT EXTRACTION Bilateral   . [place] possibly but not sure. Michela Pitcher it was negative  . ESOPHAGOGASTRODUODENOSCOPY     around the same time  said it was negative   . EYE SURGERY Bilateral    Cat Sx OU  . GALLBLADDER SURGERY     Family History  Problem Relation Age of Onset  . Diabetes Mother   . Diabetes Sister   . Diabetes Brother   . Colon cancer Neg Hx   . Esophageal cancer Neg Hx    Social History   Socioeconomic History  . Marital status: Single    Spouse name: Not on file  . Number of children: Not on file  . Years of education: Not on file  . Highest education level: Not on file  Occupational History  . Not on file  Tobacco Use  . Smoking status: Never Smoker  . Smokeless tobacco: Never Used  Substance and Sexual Activity  . Alcohol use: Never  . Drug use: Never  . Sexual activity: Not Currently  Other Topics Concern  . Not on file  Social History Narrative  . Not on file   Social Determinants of Health   Financial Resource Strain:   . Difficulty of Paying Living Expenses: Not on file  Food Insecurity:   . Worried About Charity fundraiser in the Last Year: Not on file  . Ran Out of Food in the Last Year: Not on file  Transportation Needs:   . Lack of Transportation (Medical): Not on file  . Lack of Transportation (Non-Medical): Not on file  Physical Activity:   . Days of Exercise per Week: Not on file  . Minutes of Exercise per Session: Not on file  Stress:   . Feeling of Stress : Not on file  Social Connections:   . Frequency of Communication with Friends and Family: Not on file  . Frequency of Social Gatherings with Friends and Family: Not on file  . Attends Religious Services: Not on file  . Active Member of Clubs or Organizations: Not on file  . Attends Archivist Meetings: Not on file  . Marital Status: Not on file    Tobacco Counseling Counseling given: Not Answered   Clinical Intake: Pain : No/denies pain    Activities of Daily Living In your present state of health, do you have any difficulty performing the following activities: 05/15/2019  Hearing? N  Vision?  N  Difficulty concentrating or making decisions? N  Walking or climbing stairs? N  Dressing or bathing? N  Doing errands, shopping? N  Preparing Food and eating ? N  Using the Toilet? N  In the past six months, have you accidently leaked urine? N  Do you have problems with loss of bowel control? N  Managing your Medications? N  Managing your Finances? N  Housekeeping or managing your Housekeeping? N  Some recent data might be hidden     Immunizations and Health Maintenance Immunization  History  Administered Date(s) Administered  . Influenza, High Dose Seasonal PF 03/07/2018  . Influenza,inj,Quad PF,6+ Mos 01/31/2019   Health Maintenance Due  Topic Date Due  . Hepatitis C Screening  05-29-1946  . OPHTHALMOLOGY EXAM  01/18/1957  . TETANUS/TDAP  01/18/1966  . MAMMOGRAM  01/18/1997  . COLONOSCOPY  01/18/1997  . DEXA SCAN  01/19/2012  . PNA vac Low Risk Adult (1 of 2 - PCV13) 01/19/2012    Patient Care Team: Libby Maw, MD as PCP - General (Family Medicine)  Indicate any recent Medical Services you may have received from other than [facility] providers in the past year (date may be approximate).     Assessment:   This is a routine wellness examination for Tara Conner. Physical assessment deferred to PCP.  Hearing/Vision screen Unable to assess. This visit is enabled though telemedicine due to Covid 19.   Dietary issues and exercise activities discussed: Current Exercise Habits: The patient does not participate in regular exercise at present, Exercise limited by: None identified Diet (meal preparation, eat out, water intake, caffeinated beverages, dairy products, fruits and vegetables): well balanced   Goals    . DIET - REDUCE PORTION SIZE      Depression Screen PHQ 2/9 Scores 05/15/2019  PHQ - 2 Score 0    Fall Risk Fall Risk  05/15/2019 05/14/2018  Falls in the past year? 0 0  Follow up Education provided;Falls prevention discussed -    Cognitive  Function: Ad8 score reviewed for issues:  Issues making decisions:no  Less interest in hobbies / activities:no  Repeats questions, stories (family complaining):no  Trouble using ordinary gadgets (microwave, computer, phone):no  Forgets the month or year: no  Mismanaging finances: no  Remembering appts:no  Daily problems with thinking and/or memory:no Ad8 score is=0       Screening Tests Health Maintenance  Topic Date Due  . Hepatitis C Screening  1945/10/05  . OPHTHALMOLOGY EXAM  01/18/1957  . TETANUS/TDAP  01/18/1966  . MAMMOGRAM  01/18/1997  . COLONOSCOPY  01/18/1997  . DEXA SCAN  01/19/2012  . PNA vac Low Risk Adult (1 of 2 - PCV13) 01/19/2012  . HEMOGLOBIN A1C  07/31/2019  . FOOT EXAM  01/31/2020  . URINE MICROALBUMIN  01/31/2020  . INFLUENZA VACCINE  Completed       Plan:    Please schedule your next medicare wellness visit with me in 1 yr.  Continue to eat heart healthy diet (full of fruits, vegetables, whole grains, lean protein, water--limit salt, fat, and sugar intake) and increase physical activity as tolerated.  Continue doing brain stimulating activities (puzzles, reading, adult coloring books, staying active) to keep memory sharp.   Bring a copy of your living will and/or healthcare power of attorney to your next office visit.   I have personally reviewed and noted the following in the patient's chart:   . Medical and social history . Use of alcohol, tobacco or illicit drugs  . Current medications and supplements . Functional ability and status . Nutritional status . Physical activity . Advanced directives . List of other physicians . Hospitalizations, surgeries, and ER visits in previous 12 months . Vitals . Screenings to include cognitive, depression, and falls . Referrals and appointments  In addition, I have reviewed and discussed with patient certain preventive protocols, quality metrics, and best practice recommendations. A written  personalized care plan for preventive services as well as general preventive health recommendations were provided to patient.     Naaman Plummer  Glenard Haring, RN   05/15/2019

## 2019-05-15 ENCOUNTER — Encounter: Payer: Self-pay | Admitting: *Deleted

## 2019-05-15 ENCOUNTER — Ambulatory Visit (INDEPENDENT_AMBULATORY_CARE_PROVIDER_SITE_OTHER): Payer: Medicare Other | Admitting: *Deleted

## 2019-05-15 DIAGNOSIS — Z0000 Encounter for general adult medical examination without abnormal findings: Secondary | ICD-10-CM

## 2019-05-15 NOTE — Patient Instructions (Signed)
Please schedule your next medicare wellness visit with me in 1 yr.  Continue to eat heart healthy diet (full of fruits, vegetables, whole grains, lean protein, water--limit salt, fat, and sugar intake) and increase physical activity as tolerated.  Continue doing brain stimulating activities (puzzles, reading, adult coloring books, staying active) to keep memory sharp.   Bring a copy of your living will and/or healthcare power of attorney to your next office visit.   Tara Conner , Thank you for taking time to come for your Medicare Wellness Visit. I appreciate your ongoing commitment to your health goals. Please review the following plan we discussed and let me know if I can assist you in the future.   These are the goals we discussed: Goals    . DIET - REDUCE PORTION SIZE       This is a list of the screening recommended for you and due dates:  Health Maintenance  Topic Date Due  .  Hepatitis C: One time screening is recommended by Center for Disease Control  (CDC) for  adults born from [birth year] through 1964.   04-30-1946  . Eye exam for diabetics  01/18/1957  . Tetanus Vaccine  01/18/1966  . Mammogram  01/18/1997  . Colon Cancer Screening  01/18/1997  . DEXA scan (bone density measurement)  01/19/2012  . Pneumonia vaccines (1 of 2 - PCV13) 01/19/2012  . Hemoglobin A1C  07/31/2019  . Complete foot exam   01/31/2020  . Urine Protein Check  01/31/2020  . Flu Shot  Completed    Preventive Care 65 Years and Older, Female Preventive care refers to lifestyle choices and visits with your health care provider that can promote health and wellness. This includes:  A yearly physical exam. This is also called an annual well check.  Regular dental and eye exams.  Immunizations.  Screening for certain conditions.  Healthy lifestyle choices, such as diet and exercise. What can I expect for my preventive care visit? Physical exam Your health care provider will check:  Height and  weight. These may be used to calculate body mass index (BMI), which is a measurement that tells if you are at a healthy weight.  Heart rate and blood pressure.  Your skin for abnormal spots. Counseling Your health care provider may ask you questions about:  Alcohol, tobacco, and drug use.  Emotional well-being.  Home and relationship well-being.  Sexual activity.  Eating habits.  History of falls.  Memory and ability to understand (cognition).  Work and work Statistician.  Pregnancy and menstrual history. What immunizations do I need?  Influenza (flu) vaccine  This is recommended every year. Tetanus, diphtheria, and pertussis (Tdap) vaccine  You may need a Td booster every 10 years. Varicella (chickenpox) vaccine  You may need this vaccine if you have not already been vaccinated. Zoster (shingles) vaccine  You may need this after age 28. Pneumococcal conjugate (PCV13) vaccine  One dose is recommended after age 8. Pneumococcal polysaccharide (PPSV23) vaccine  One dose is recommended after age 33. Measles, mumps, and rubella (MMR) vaccine  You may need at least one dose of MMR if you were born in 1957 or later. You may also need a second dose. Meningococcal conjugate (MenACWY) vaccine  You may need this if you have certain conditions. Hepatitis A vaccine  You may need this if you have certain conditions or if you travel or work in places where you may be exposed to hepatitis A. Hepatitis B vaccine  You may  need this if you have certain conditions or if you travel or work in places where you may be exposed to hepatitis B. Haemophilus influenzae type b (Hib) vaccine  You may need this if you have certain conditions. You may receive vaccines as individual doses or as more than one vaccine together in one shot (combination vaccines). Talk with your health care provider about the risks and benefits of combination vaccines. What tests do I need? Blood  tests  Lipid and cholesterol levels. These may be checked every 5 years, or more frequently depending on your overall health.  Hepatitis C test.  Hepatitis B test. Screening  Lung cancer screening. You may have this screening every year starting at age 12 if you have a 30-pack-year history of smoking and currently smoke or have quit within the past 15 years.  Colorectal cancer screening. All adults should have this screening starting at age 64 and continuing until age 36. Your health care provider may recommend screening at age 65 if you are at increased risk. You will have tests every 1-10 years, depending on your results and the type of screening test.  Diabetes screening. This is done by checking your blood sugar (glucose) after you have not eaten for a while (fasting). You may have this done every 1-3 years.  Mammogram. This may be done every 1-2 years. Talk with your health care provider about how often you should have regular mammograms.  BRCA-related cancer screening. This may be done if you have a family history of breast, ovarian, tubal, or peritoneal cancers. Other tests  Sexually transmitted disease (STD) testing.  Bone density scan. This is done to screen for osteoporosis. You may have this done starting at age 57. Follow these instructions at home: Eating and drinking  Eat a diet that includes fresh fruits and vegetables, whole grains, lean protein, and low-fat dairy products. Limit your intake of foods with high amounts of sugar, saturated fats, and salt.  Take vitamin and mineral supplements as recommended by your health care provider.  Do not drink alcohol if your health care provider tells you not to drink.  If you drink alcohol: ? Limit how much you have to 0-1 drink a day. ? Be aware of how much alcohol is in your drink. In the [place] one drink equals one 12 oz bottle of beer (355 mL), one 5 oz glass of wine (148 mL), or one 1 oz glass of hard liquor (44  mL). Lifestyle  Take daily care of your teeth and gums.  Stay active. Exercise for at least 30 minutes on 5 or more days each week.  Do not use any products that contain nicotine or tobacco, such as cigarettes, e-cigarettes, and chewing tobacco. If you need help quitting, ask your health care provider.  If you are sexually active, practice safe sex. Use a condom or other form of protection in order to prevent STIs (sexually transmitted infections).  Talk with your health care provider about taking a low-dose aspirin or statin. What's next?  Go to your health care provider once a year for a well check visit.  Ask your health care provider how often you should have your eyes and teeth checked.  Stay up to date on all vaccines. This information is not intended to replace advice given to you by your health care provider. Make sure you discuss any questions you have with your health care provider. Document Released: 06/12/2015 Document Revised: 05/10/2018 Document Reviewed: 05/10/2018 Elsevier Patient Education  [place].

## 2019-07-04 ENCOUNTER — Other Ambulatory Visit: Payer: Self-pay | Admitting: Family Medicine

## 2019-07-04 DIAGNOSIS — K219 Gastro-esophageal reflux disease without esophagitis: Secondary | ICD-10-CM

## 2019-07-04 DIAGNOSIS — I10 Essential (primary) hypertension: Secondary | ICD-10-CM

## 2019-07-19 ENCOUNTER — Other Ambulatory Visit: Payer: Self-pay

## 2019-07-19 ENCOUNTER — Telehealth: Payer: Self-pay | Admitting: Family Medicine

## 2019-07-19 ENCOUNTER — Other Ambulatory Visit: Payer: Self-pay | Admitting: Family Medicine

## 2019-07-19 DIAGNOSIS — Z794 Long term (current) use of insulin: Secondary | ICD-10-CM

## 2019-07-19 DIAGNOSIS — E1140 Type 2 diabetes mellitus with diabetic neuropathy, unspecified: Secondary | ICD-10-CM

## 2019-07-19 MED ORDER — ACCU-CHEK SOFT TOUCH LANCETS MISC: 99 refills | Status: DC

## 2019-07-19 MED ORDER — GLUCOSE BLOOD VI STRP: ORAL_STRIP | 6 refills | Status: DC

## 2019-07-19 NOTE — Telephone Encounter (Signed)
Last OV 01/31/19 Last Annual wellness visit 05/15/19 Last fill 09/21/18  #27ml/1

## 2019-07-19 NOTE — Telephone Encounter (Signed)
Refill request sent into pharmacy today.

## 2019-07-19 NOTE — Telephone Encounter (Signed)
Patient is calling and requesting a refill for Lantus, Insulin and Test Strips sent to [facility] on [address] CB is [phone]

## 2019-07-22 NOTE — Telephone Encounter (Signed)
Okay to send in Insulin?

## 2019-07-22 NOTE — Telephone Encounter (Signed)
Patient called back and stated that only one refill was sent. A request for Insulin and test strips are still needed. Pls advise. CB is [phone]

## 2019-07-22 NOTE — Telephone Encounter (Signed)
Yes

## 2019-07-23 ENCOUNTER — Other Ambulatory Visit: Payer: Self-pay

## 2019-07-23 DIAGNOSIS — E1140 Type 2 diabetes mellitus with diabetic neuropathy, unspecified: Secondary | ICD-10-CM

## 2019-07-23 DIAGNOSIS — Z794 Long term (current) use of insulin: Secondary | ICD-10-CM

## 2019-07-23 MED ORDER — INSULIN LISPRO 100 UNIT/ML ~~LOC~~ SOLN: 0.0000 [IU] | Freq: Three times a day (TID) | SUBCUTANEOUS | 4 refills | Status: DC

## 2019-07-24 NOTE — Telephone Encounter (Signed)
Patient is calling back and stated that she still haven't received her Humalog and Lantus. Pt uses [facility] on [address]. CB is [phone]

## 2019-07-29 NOTE — Telephone Encounter (Signed)
Patient is calling back and stated that the wrong medication was sent to the pharmacy and she needs Accu Chek sent to [facility] on [address]. CB is [phone]

## 2019-07-30 ENCOUNTER — Other Ambulatory Visit: Payer: Self-pay

## 2019-07-30 MED ORDER — GLUCOSE BLOOD VI STRP: ORAL_STRIP | 12 refills | Status: DC

## 2019-07-30 NOTE — Telephone Encounter (Signed)
Sent in correct supply/thx dmf

## 2019-09-05 ENCOUNTER — Other Ambulatory Visit: Payer: Self-pay

## 2019-09-23 ENCOUNTER — Other Ambulatory Visit: Payer: Self-pay

## 2019-09-24 ENCOUNTER — Telehealth: Payer: Self-pay | Admitting: Family Medicine

## 2019-09-24 ENCOUNTER — Ambulatory Visit (INDEPENDENT_AMBULATORY_CARE_PROVIDER_SITE_OTHER): Payer: Medicare Other | Admitting: Family Medicine

## 2019-09-24 ENCOUNTER — Encounter: Payer: Self-pay | Admitting: Family Medicine

## 2019-09-24 ENCOUNTER — Telehealth: Payer: Self-pay

## 2019-09-24 VITALS — BP 130/78 | HR 99 | Temp 97.4°F | Ht 67.0 in | Wt 215.6 lb

## 2019-09-24 DIAGNOSIS — Z1159 Encounter for screening for other viral diseases: Secondary | ICD-10-CM

## 2019-09-24 DIAGNOSIS — Z0000 Encounter for general adult medical examination without abnormal findings: Secondary | ICD-10-CM

## 2019-09-24 DIAGNOSIS — E7800 Pure hypercholesterolemia, unspecified: Secondary | ICD-10-CM

## 2019-09-24 DIAGNOSIS — D509 Iron deficiency anemia, unspecified: Secondary | ICD-10-CM | POA: Diagnosis not present

## 2019-09-24 DIAGNOSIS — E1140 Type 2 diabetes mellitus with diabetic neuropathy, unspecified: Secondary | ICD-10-CM | POA: Diagnosis not present

## 2019-09-24 DIAGNOSIS — Z794 Long term (current) use of insulin: Secondary | ICD-10-CM

## 2019-09-24 DIAGNOSIS — I639 Cerebral infarction, unspecified: Secondary | ICD-10-CM | POA: Diagnosis not present

## 2019-09-24 DIAGNOSIS — I10 Essential (primary) hypertension: Secondary | ICD-10-CM | POA: Diagnosis not present

## 2019-09-24 DIAGNOSIS — Z1211 Encounter for screening for malignant neoplasm of colon: Secondary | ICD-10-CM

## 2019-09-24 DIAGNOSIS — D649 Anemia, unspecified: Secondary | ICD-10-CM

## 2019-09-24 LAB — MICROALBUMIN / CREATININE URINE RATIO
Creatinine,U: 127.5 mg/dL
Microalb Creat Ratio: 2.8 mg/g (ref 0.0–30.0)
Microalb, Ur: 3.5 mg/dL — ABNORMAL HIGH (ref 0.0–1.9)

## 2019-09-24 LAB — URINALYSIS, ROUTINE W REFLEX MICROSCOPIC
Bilirubin Urine: NEGATIVE
Hgb urine dipstick: NEGATIVE
Ketones, ur: NEGATIVE
Nitrite: NEGATIVE
Specific Gravity, Urine: 1.02 (ref 1.000–1.030)
Total Protein, Urine: NEGATIVE
Urine Glucose: NEGATIVE
Urobilinogen, UA: 0.2 (ref 0.0–1.0)
pH: 5 (ref 5.0–8.0)

## 2019-09-24 MED ORDER — INSULIN GLARGINE 100 UNITS/ML SOLOSTAR PEN: 40.0000 [IU] | PEN_INJECTOR | Freq: Every day | SUBCUTANEOUS | 11 refills | Status: DC

## 2019-09-24 NOTE — Telephone Encounter (Signed)
Patient aware that Rx for needles were sent to pharmacy and she can pick them up.

## 2019-09-24 NOTE — Progress Notes (Addendum)
Established Patient Office Visit  Subjective:  Patient ID: Arieonna Medine, female    DOB: 1946-05-10  Age: 74 y.o. MRN: [id number]  CC:  Chief Complaint  Patient presents with  . Back Pain    Pt c/o lower back pain, when she bends down to pick something up, she has pain when she rasies.  She does have a hx of back pain.  Pt gave urine sample today.  Pt has a form from home health services for Dr. Ethelene Hal to look over.    HPI Beverly Ferner presents for her follow-up of her hypertension, diabetes, iron deficiency.  Reports compliance with all medications.  She has been having some lower back pain.  Is an ongoing issue for her.  She takes a Tylenol arthritis and it is relieved.  More recently she is experiencing spasms in her upper abdomen after she bends over to pick something up.  She saw the ophthalmologist back in October and was advised to return in 6 weeks but did not do so.  Patient is in need of a female check in a colonoscopy.  She is fasting today.  Was seen by visiting home health nurses recently and her ABIs were normal.  Patient request Lantus pen.  Past Medical History:  Diagnosis Date  . Diabetes mellitus without complication ([place])   . Diabetic retinopathy ([place])    PDR OU  . Hypertension   . Hypertensive retinopathy    OU  . Stroke [facility]) 2019    Past Surgical History:  Procedure Laterality Date  . CATARACT EXTRACTION Bilateral   . [place] possibly but not sure. Michela Pitcher it was negative  . ESOPHAGOGASTRODUODENOSCOPY     around the same time said it was negative   . EYE SURGERY Bilateral    Cat Sx OU  . GALLBLADDER SURGERY      Family History  Problem Relation Age of Onset  . Diabetes Mother   . Diabetes Sister   . Diabetes Brother   . Colon cancer Neg Hx   . Esophageal cancer Neg Hx     Social History   Socioeconomic History  . Marital status: Single    Spouse name: Not on file  . Number of children: Not on file  . Years  of education: Not on file  . Highest education level: Not on file  Occupational History  . Not on file  Tobacco Use  . Smoking status: Never Smoker  . Smokeless tobacco: Never Used  Substance and Sexual Activity  . Alcohol use: Never  . Drug use: Never  . Sexual activity: Not Currently  Other Topics Concern  . Not on file  Social History Narrative  . Not on file   Social Determinants of Health   Financial Resource Strain:   . Difficulty of Paying Living Expenses:   Food Insecurity:   . Worried About Charity fundraiser in the Last Year:   . Arboriculturist in the Last Year:   Transportation Needs:   . Film/video editor (Medical):   Marland Kitchen Lack of Transportation (Non-Medical):   Physical Activity:   . Days of Exercise per Week:   . Minutes of Exercise per Session:   Stress:   . Feeling of Stress :   Social Connections:   . Frequency of Communication with Friends and Family:   . Frequency of Social Gatherings with Friends and Family:   . Attends Religious Services:   . Active  Member of Clubs or Organizations:   . Attends Archivist Meetings:   Marland Kitchen Marital Status:   Intimate Partner Violence:   . Fear of Current or Ex-Partner:   . Emotionally Abused:   Marland Kitchen Physically Abused:   . Sexually Abused:     Outpatient Medications Prior to Visit  Medication Sig Dispense Refill  . Ascorbic Acid (VITAMIN C PO) Take 1 tablet by mouth as needed.    . Blood Glucose Monitoring Suppl (ACCU-CHEK AVIVA PLUS) w/Device KIT USE TO TEST BLOOD SUGAR TWICE DAILY 1 kit 1  . cimetidine (TAGAMET) 300 MG tablet TAKE 1 TABLET(300 MG) BY MOUTH AT BEDTIME 90 tablet 4  . ferrous sulfate 325 (65 FE) MG tablet Take 1 tablet (325 mg total) by mouth daily with breakfast. 90 tablet 1  . gabapentin (NEURONTIN) 300 MG capsule TAKE ONE CAPSULE BY MOUTH AT NIGHT FOR 1 WEEK AND THEN INCREASE TO 1 CAPSULE TWICE DAILY AS TOLERATED 60 capsule 3  . glucose blood test strip UAD qid to monitor blood  glucose/Dx: E11.40 200 each 12  . Glycerin-Polysorbate 80 (REFRESH DRY EYE THERAPY OP) Place 1-2 drops into both eyes as needed (for dryness).     . insulin lispro (HUMALOG) 100 UNIT/ML injection Inject 0-0.15 mLs (0-15 Units total) into the skin 3 (three) times daily with meals. 30 mL 4  . Lancets (ACCU-CHEK SOFT TOUCH) lancets Use 4 times daily. Patient now using prandial insulin. 100 each PRN  . Multiple Vitamins-Minerals (MULTIVITAMIN ADULT PO) Take 1 tablet by mouth daily.    Marland Kitchen NEEDLE, DISP, 30 G (B-D DISP NEEDLE 30GX1") 30G X 1" MISC To use with Lantus. 100 each 3  . VITAMIN D PO Take 1 tablet by mouth daily.    Marland Kitchen amLODipine (NORVASC) 5 MG tablet TAKE 1 TABLET(5 MG) BY MOUTH DAILY 90 tablet 1  . atorvastatin (LIPITOR) 40 MG tablet Take 1 tablet (40 mg total) by mouth daily. 90 tablet 2  . clopidogrel (PLAVIX) 75 MG tablet TAKE 1 TABLET(75 MG) BY MOUTH DAILY 90 tablet 1  . LANTUS 100 UNIT/ML injection ADMINISTER 40 UNITS UNDER THE SKIN EVERY NIGHT AT BEDTIME 40 mL 1   No facility-administered medications prior to visit.    Allergies  Allergen Reactions  . Penicillins     Said she was young had a reaction   . Sulfa Antibiotics Other (See Comments)    Pt unable to report reaction Said she had a rash    ROS Review of Systems  Constitutional: Negative.   HENT: Negative.   Eyes: Negative for photophobia and visual disturbance.  Respiratory: Negative.   Cardiovascular: Negative.   Gastrointestinal: Negative.   Endocrine: Negative for polyphagia and polyuria.  Genitourinary: Negative.   Musculoskeletal: Positive for back pain.  Neurological: Negative for speech difficulty and light-headedness.  Hematological: Does not bruise/bleed easily.  Psychiatric/Behavioral: Negative.       Objective:    Physical Exam  Constitutional: She is oriented to person, place, and time. She appears well-developed and well-nourished. No distress.  HENT:  Head: Normocephalic and atraumatic.    Right Ear: External ear normal.  Left Ear: External ear normal.  Eyes: Pupils are equal, round, and reactive to light. Conjunctivae are normal. Right eye exhibits no discharge. Left eye exhibits no discharge. No scleral icterus.  Neck: No JVD present. No tracheal deviation present. No thyromegaly present.  Cardiovascular: Normal rate, regular rhythm and normal heart sounds.  Pulmonary/Chest: Effort normal and breath sounds normal. No stridor.  Abdominal: Bowel sounds are normal.  Musculoskeletal:        General: No edema.  Lymphadenopathy:    She has no cervical adenopathy.  Neurological: She is alert and oriented to person, place, and time.  Skin: Skin is warm and dry. She is not diaphoretic.  Psychiatric: She has a normal mood and affect. Her behavior is normal.   Diabetic Foot Exam - Simple   Simple Foot Form Visual Inspection See comments: Yes Sensation Testing Intact to touch and monofilament testing bilaterally: Yes Pulse Check See comments: Yes Comments Relaxed arches with mild halux valgus deformity.  Pulses not palpable with normal vascular US.      BP 130/78 (Cuff Size: Normal)   Pulse 99   Temp (!) 97.4 F (36.3 C) (Temporal)   Ht '5\' 7"'  (1.702 m)   Wt 215 lb 9.6 oz (97.8 kg)   LMP  (LMP Unknown)   SpO2 99%   BMI 33.77 kg/m  Wt Readings from Last 3 Encounters:  09/24/19 215 lb 9.6 oz (97.8 kg)  01/31/19 217 lb (98.4 kg)  11/02/18 205 lb (93 kg)     Health Maintenance Due  Topic Date Due  . OPHTHALMOLOGY EXAM  Never done  . COVID-19 Vaccine (1) Never done  . TETANUS/TDAP  Never done  . MAMMOGRAM  Never done  . COLONOSCOPY  Never done  . DEXA SCAN  Never done  . PNA vac Low Risk Adult (1 of 2 - PCV13) Never done    There are no preventive care reminders to display for this patient.  Lab Results  Component Value Date   TSH 1.22 08/02/2018   Lab Results  Component Value Date   WBC 6.0 09/24/2019   HGB 10.2 (L) 09/24/2019   HCT 30.8 (L)  09/24/2019   MCV 85.7 09/24/2019   PLT 393.0 09/24/2019   Lab Results  Component Value Date   NA 137 09/24/2019   K 3.9 09/24/2019   CO2 25 09/24/2019   GLUCOSE 120 (H) 09/24/2019   BUN 23 09/24/2019   CREATININE 1.20 09/24/2019   BILITOT 0.4 09/24/2019   ALKPHOS 87 09/24/2019   AST 19 09/24/2019   ALT 15 09/24/2019   PROT 7.4 09/24/2019   ALBUMIN 3.8 09/24/2019   CALCIUM 8.9 09/24/2019   ANIONGAP 5 04/09/2018   GFR 53.33 (L) 09/24/2019   Lab Results  Component Value Date   CHOL 141 09/24/2019   Lab Results  Component Value Date   HDL 37.60 (L) 09/24/2019   Lab Results  Component Value Date   LDLCALC 84 09/24/2019   Lab Results  Component Value Date   TRIG 98.0 09/24/2019   Lab Results  Component Value Date   CHOLHDL 4 09/24/2019   Lab Results  Component Value Date   HGBA1C 9.6 (H) 09/24/2019      Assessment & Plan:   Problem List Items Addressed This Visit      Cardiovascular and Mediastinum   Essential hypertension - Primary   Relevant Medications   atorvastatin (LIPITOR) 80 MG tablet   Other Relevant Orders   CBC (Completed)   Comprehensive metabolic panel (Completed)   Urinalysis, Routine w reflex microscopic (Completed)   Microalbumin / creatinine urine ratio (Completed)   CVA (cerebral vascular accident) ([place])   Relevant Medications   atorvastatin (LIPITOR) 80 MG tablet     Endocrine   Type 2 diabetes mellitus with diabetic neuropathy, with long-term current use of insulin (HCC)   Relevant Medications  insulin glargine (LANTUS) 100 unit/mL SOPN   atorvastatin (LIPITOR) 80 MG tablet   Other Relevant Orders   Comprehensive metabolic panel (Completed)   Hemoglobin A1c (Completed)   Urinalysis, Routine w reflex microscopic (Completed)   Microalbumin / creatinine urine ratio (Completed)   Ambulatory referral to Ophthalmology     Other   Elevated LDL cholesterol level   Relevant Medications   atorvastatin (LIPITOR) 80 MG tablet   Other  Relevant Orders   LDL cholesterol, direct (Completed)   Lipid panel (Completed)   Screen for colon cancer   Anemia   Relevant Orders   Iron, TIBC and Ferritin Panel (Completed)   Iron deficiency anemia   Relevant Orders   Iron, TIBC and Ferritin Panel (Completed)    Other Visit Diagnoses    Encounter for hepatitis C screening test for low risk patient       Relevant Orders   Hepatitis C antibody (Completed)   Healthcare maintenance       Relevant Orders   Ambulatory referral to Gynecology   Ambulatory referral to Gastroenterology      Meds ordered this encounter  Medications  . insulin glargine (LANTUS) 100 unit/mL SOPN    Sig: Inject 0.4 mLs (40 Units total) into the skin daily.    Dispense:  15 mL    Refill:  11  . atorvastatin (LIPITOR) 80 MG tablet    Sig: Take 1 tablet (80 mg total) by mouth daily.    Dispense:  90 tablet    Refill:  3    Follow-up: Return in about 3 months (around 12/24/2019).    Libby Maw, MD

## 2019-09-24 NOTE — Telephone Encounter (Signed)
Patient is calling and stated that a prescription that was given to her today only came with pens and no needles. A rx needs to be sent to the pharmacy for the needle caps. Pls advise. CB is [phone]

## 2019-09-25 LAB — IRON,TIBC AND FERRITIN PANEL
%SAT: 16 % (calc) (ref 16–45)
Ferritin: 60 ng/mL (ref 16–288)
Iron: 50 ug/dL (ref 45–160)
TIBC: 319 mcg/dL (calc) (ref 250–450)

## 2019-09-25 LAB — HEMOGLOBIN A1C: Hgb A1c MFr Bld: 9.6 % — ABNORMAL HIGH (ref 4.6–6.5)

## 2019-09-25 LAB — LIPID PANEL
Cholesterol: 141 mg/dL (ref 0–200)
HDL: 37.6 mg/dL — ABNORMAL LOW (ref >39.00)
LDL Cholesterol: 84 mg/dL (ref 0–99)
NonHDL: 103.88
Total CHOL/HDL Ratio: 4
Triglycerides: 98 mg/dL (ref 0.0–149.0)
VLDL: 19.6 mg/dL (ref 0.0–40.0)

## 2019-09-25 LAB — COMPREHENSIVE METABOLIC PANEL
ALT: 15 U/L (ref 0–35)
AST: 19 U/L (ref 0–37)
Albumin: 3.8 g/dL (ref 3.5–5.2)
Alkaline Phosphatase: 87 U/L (ref 39–117)
BUN: 23 mg/dL (ref 6–23)
CO2: 25 mEq/L (ref 19–32)
Calcium: 8.9 mg/dL (ref 8.4–10.5)
Chloride: 103 mEq/L (ref 96–112)
Creatinine, Ser: 1.2 mg/dL (ref 0.40–1.20)
GFR: 53.33 mL/min — ABNORMAL LOW (ref >60.00)
Glucose, Bld: 120 mg/dL — ABNORMAL HIGH (ref 70–99)
Potassium: 3.9 mEq/L (ref 3.5–5.1)
Sodium: 137 mEq/L (ref 135–145)
Total Bilirubin: 0.4 mg/dL (ref 0.2–1.2)
Total Protein: 7.4 g/dL (ref 6.0–8.3)

## 2019-09-25 LAB — HEPATITIS C ANTIBODY
Hepatitis C Ab: NONREACTIVE
SIGNAL TO CUT-OFF: 0.15 (ref <1.00)

## 2019-09-25 LAB — LDL CHOLESTEROL, DIRECT: Direct LDL: 77 mg/dL

## 2019-09-26 ENCOUNTER — Other Ambulatory Visit: Payer: Self-pay | Admitting: Family Medicine

## 2019-09-26 DIAGNOSIS — I10 Essential (primary) hypertension: Secondary | ICD-10-CM

## 2019-09-26 LAB — CBC
HCT: 30.8 % — ABNORMAL LOW (ref 36.0–46.0)
Hemoglobin: 10.2 g/dL — ABNORMAL LOW (ref 12.0–15.0)
MCHC: 33.2 g/dL (ref 30.0–36.0)
MCV: 85.7 fl (ref 78.0–100.0)
Platelets: 393 10*3/uL (ref 150.0–400.0)
RBC: 3.59 Mil/uL — ABNORMAL LOW (ref 3.87–5.11)
RDW: 13.6 % (ref 11.5–15.5)
WBC: 6 10*3/uL (ref 4.0–10.5)

## 2019-09-27 MED ORDER — ATORVASTATIN CALCIUM 80 MG PO TABS: 80.0000 mg | ORAL_TABLET | Freq: Every day | ORAL | 3 refills | Status: DC

## 2019-09-27 NOTE — Addendum Note (Signed)
Addended by: Andrez Grime on: 09/27/2019 09:16 AM   Modules accepted: Orders

## 2019-10-01 NOTE — Telephone Encounter (Signed)
Patient is calling and stated that the needles she received was making her stomach bleed and if she can get the ultra thin 4 ml sent to the pharmacy. CB is [phone].

## 2019-10-03 ENCOUNTER — Ambulatory Visit: Payer: Medicare Other | Attending: Internal Medicine

## 2019-10-03 ENCOUNTER — Ambulatory Visit: Payer: Medicare Other

## 2019-10-03 DIAGNOSIS — Z23 Encounter for immunization: Secondary | ICD-10-CM

## 2019-10-03 NOTE — Progress Notes (Signed)
   Covid-19 Vaccination Clinic  Name:  Tara Conner    MRN: [id number] DOB: 1946/02/19  10/03/2019  [name] was observed post Covid-19 immunization for 15 minutes without incident. She was provided with Vaccine Information Sheet and instruction to access the V-Safe system.   [name] was instructed to call 911 with any severe reactions post vaccine: Marland Kitchen Difficulty breathing  . Swelling of face and throat  . A fast heartbeat  . A bad rash all over body  . Dizziness and weakness   Immunizations Administered    Name Date Dose VIS Date Route   Pfizer COVID-19 Vaccine 10/03/2019 12:58 PM 0.3 mL 07/24/2018 Intramuscular   Manufacturer: [employer], [employer]   Lot: [id number]   NDC: [address]-3

## 2019-10-04 ENCOUNTER — Other Ambulatory Visit: Payer: Self-pay

## 2019-10-04 MED ORDER — ULTRA THIN PEN NEEDLES 32G X 4 MM MISC: 1 refills | Status: DC

## 2019-10-04 NOTE — Telephone Encounter (Signed)
Patient is calling and stated that the needles she received was making her stomach bleed and if she can get the ultra thin 4 ml sent to the pharmacy

## 2019-10-04 NOTE — Telephone Encounter (Signed)
Needles sent to pharmacy.  Pt aware.

## 2019-10-08 ENCOUNTER — Ambulatory Visit: Payer: Medicare Other | Admitting: Nurse Practitioner

## 2019-10-15 ENCOUNTER — Other Ambulatory Visit: Payer: Self-pay

## 2019-10-15 ENCOUNTER — Telehealth: Payer: Self-pay | Admitting: Family Medicine

## 2019-10-15 DIAGNOSIS — E1140 Type 2 diabetes mellitus with diabetic neuropathy, unspecified: Secondary | ICD-10-CM

## 2019-10-15 MED ORDER — GLUCOSE BLOOD VI STRP: ORAL_STRIP | 12 refills | Status: DC

## 2019-10-15 NOTE — Telephone Encounter (Signed)
Patient is requesting prescriptions for Humalog pens and test strips. She states she tests three times per day. Please send to same [facility].

## 2019-10-29 ENCOUNTER — Ambulatory Visit: Payer: Medicare Other | Attending: Internal Medicine

## 2019-10-29 DIAGNOSIS — Z23 Encounter for immunization: Secondary | ICD-10-CM

## 2019-10-29 NOTE — Progress Notes (Signed)
   Covid-19 Vaccination Clinic  Name:  Tara Conner    MRN: [id number] DOB: 1945-08-14  10/29/2019  Tara Conner was observed post Covid-19 immunization for 15 minutes without incident. She was provided with Vaccine Information Sheet and instruction to access the V-Safe system.   Tara Conner was instructed to call 911 with any severe reactions post vaccine: Marland Kitchen Difficulty breathing  . Swelling of face and throat  . A fast heartbeat  . A bad rash all over body  . Dizziness and weakness   Immunizations Administered    Name Date Dose VIS Date Route   Pfizer COVID-19 Vaccine 10/29/2019  1:06 PM 0.3 mL 07/24/2018 Intramuscular   Manufacturer: [employer], [employer]   Lot: [id number]   NDC: [address]-2

## 2019-12-06 ENCOUNTER — Other Ambulatory Visit: Payer: Self-pay | Admitting: Family Medicine

## 2019-12-11 ENCOUNTER — Encounter: Payer: Self-pay | Admitting: Family Medicine

## 2019-12-11 DIAGNOSIS — H40013 Open angle with borderline findings, low risk, bilateral: Secondary | ICD-10-CM | POA: Diagnosis not present

## 2019-12-11 DIAGNOSIS — H26492 Other secondary cataract, left eye: Secondary | ICD-10-CM | POA: Diagnosis not present

## 2019-12-11 DIAGNOSIS — E113593 Type 2 diabetes mellitus with proliferative diabetic retinopathy without macular edema, bilateral: Secondary | ICD-10-CM | POA: Diagnosis not present

## 2019-12-11 DIAGNOSIS — Z961 Presence of intraocular lens: Secondary | ICD-10-CM | POA: Diagnosis not present

## 2019-12-11 DIAGNOSIS — H35373 Puckering of macula, bilateral: Secondary | ICD-10-CM | POA: Diagnosis not present

## 2019-12-11 LAB — HM DIABETES EYE EXAM

## 2019-12-17 ENCOUNTER — Ambulatory Visit (HOSPITAL_BASED_OUTPATIENT_CLINIC_OR_DEPARTMENT_OTHER): Payer: Medicare Other | Admitting: Obstetrics and Gynecology

## 2019-12-17 ENCOUNTER — Other Ambulatory Visit: Payer: Self-pay

## 2019-12-17 ENCOUNTER — Encounter: Payer: Self-pay | Admitting: Obstetrics and Gynecology

## 2019-12-17 ENCOUNTER — Other Ambulatory Visit (HOSPITAL_COMMUNITY)
Admission: RE | Admit: 2019-12-17 | Discharge: 2019-12-17 | Disposition: A | Discharge status: Home / Self Care | Payer: Medicare Other | Source: Ambulatory Visit | Attending: Obstetrics and Gynecology | Admitting: Obstetrics and Gynecology

## 2019-12-17 VITALS — BP 110/72 | HR 101 | Wt 220.1 lb

## 2019-12-17 DIAGNOSIS — Z01419 Encounter for gynecological examination (general) (routine) without abnormal findings: Secondary | ICD-10-CM | POA: Insufficient documentation

## 2019-12-17 DIAGNOSIS — Z1151 Encounter for screening for human papillomavirus (HPV): Secondary | ICD-10-CM | POA: Diagnosis not present

## 2019-12-17 NOTE — Addendum Note (Signed)
Addended by: Leola Brazil on: 12/17/2019 04:19 PM   Modules accepted: Orders

## 2019-12-17 NOTE — Progress Notes (Signed)
GYNECOLOGY ANNUAL PREVENTATIVE CARE ENCOUNTER NOTE  Subjective:   Tara Conner is a 74 y.o. No obstetric history on file. female here for a routine annual gynecologic exam.  Current complaints: none.   Denies abnormal vaginal bleeding, discharge, pelvic pain, problems with intercourse or other gynecologic concerns.   The patient denies any pelvic pain, vaginal bleeding, vaginal itching/burning, or vaginal discharge.   Gynecologic History No LMP recorded (lmp unknown). Patient is postmenopausal. Patient is not sexually active.  Her last coitus was about 3 years ago.  She is not interested in STD screening. Contraception: abstinence Last Pap: greater than 10 years ago.Marland Kitchen Results were: normal.  She denies any history of abnormal pap smears. Last mammogram: about 10 years ago. Results were: normal.  She denies any history of abnormal mammograms or breast exams.    Obstetric History OB History  No obstetric history on file.  s/p 11 vaginal deliveries.  Past Medical History:  Diagnosis Date  . Diabetes mellitus without complication ([place])   . Diabetic retinopathy ([place])    PDR OU  . Hypertension   . Hypertensive retinopathy    OU  . Stroke [facility]) 2019    Past Surgical History:  Procedure Laterality Date  . CATARACT EXTRACTION Bilateral   . [place] possibly but not sure. Michela Pitcher it was negative  . ESOPHAGOGASTRODUODENOSCOPY     around the same time said it was negative   . EYE SURGERY Bilateral    Cat Sx OU  . GALLBLADDER SURGERY      Current Outpatient Medications on File Prior to Visit  Medication Sig Dispense Refill  . amLODipine (NORVASC) 5 MG tablet TAKE 1 TABLET(5 MG) BY MOUTH DAILY 90 tablet 1  . Ascorbic Acid (VITAMIN C PO) Take 1 tablet by mouth as needed.    . Blood Glucose Monitoring Suppl (ACCU-CHEK AVIVA PLUS) w/Device KIT USE TO TEST BLOOD SUGAR TWICE DAILY 1 kit 1  . cimetidine (TAGAMET) 300 MG tablet TAKE 1 TABLET(300 MG) BY MOUTH  AT BEDTIME 90 tablet 4  . clopidogrel (PLAVIX) 75 MG tablet TAKE 1 TABLET(75 MG) BY MOUTH DAILY 90 tablet 1  . ferrous sulfate 325 (65 FE) MG tablet Take 1 tablet (325 mg total) by mouth daily with breakfast. 90 tablet 1  . gabapentin (NEURONTIN) 300 MG capsule TAKE ONE CAPSULE BY MOUTH AT NIGHT FOR 1 WEEK AND THEN INCREASE TO 1 CAPSULE TWICE DAILY AS TOLERATED 60 capsule 3  . glucose blood test strip UAD qid to monitor blood glucose/Dx: E11.40 200 each 12  . Glycerin-Polysorbate 80 (REFRESH DRY EYE THERAPY OP) Place 1-2 drops into both eyes as needed (for dryness).     . insulin glargine (LANTUS) 100 unit/mL SOPN Inject 0.4 mLs (40 Units total) into the skin daily. 15 mL 11  . insulin lispro (HUMALOG) 100 UNIT/ML injection Inject 0-0.15 mLs (0-15 Units total) into the skin 3 (three) times daily with meals. 30 mL 4  . Insulin Pen Needle (ULTRA THIN PEN NEEDLES) 32G X 4 MM MISC To use with Lantus 100 each 1  . Insulin Syringe-Needle U-100 (INSULIN SYRINGE 1CC/31GX5/16") 31G X 5/16" 1 ML MISC USE AS DIRECTED WITH LANTUS 100 each 5  . Lancets (ACCU-CHEK SOFT TOUCH) lancets Use 4 times daily. Patient now using prandial insulin. 100 each PRN  . Multiple Vitamins-Minerals (MULTIVITAMIN ADULT PO) Take 1 tablet by mouth daily.    Marland Kitchen NEEDLE, DISP, 30 G (B-D DISP NEEDLE 30GX1") 30G X  1" MISC To use with Lantus. 100 each 3  . VITAMIN D PO Take 1 tablet by mouth daily.    Marland Kitchen atorvastatin (LIPITOR) 80 MG tablet Take 1 tablet (80 mg total) by mouth daily. (Patient not taking: Reported on 12/17/2019) 90 tablet 3   No current facility-administered medications on file prior to visit.    Allergies  Allergen Reactions  . Penicillins     Said she was young had a reaction   . Sulfa Antibiotics Other (See Comments)    Pt unable to report reaction Said she had a rash    Social History   Socioeconomic History  . Marital status: Single    Spouse name: Not on file  . Number of children: Not on file  . Years of  education: Not on file  . Highest education level: Not on file  Occupational History  . Not on file  Tobacco Use  . Smoking status: Never Smoker  . Smokeless tobacco: Never Used  Vaping Use  . Vaping Use: Never used  Substance and Sexual Activity  . Alcohol use: Never  . Drug use: Never  . Sexual activity: Not Currently  Other Topics Concern  . Not on file  Social History Narrative  . Not on file   Social Determinants of Health   Financial Resource Strain:   . Difficulty of Paying Living Expenses:   Food Insecurity:   . Worried About Charity fundraiser in the Last Year:   . Arboriculturist in the Last Year:   Transportation Needs:   . Film/video editor (Medical):   Marland Kitchen Lack of Transportation (Non-Medical):   Physical Activity:   . Days of Exercise per Week:   . Minutes of Exercise per Session:   Stress:   . Feeling of Stress :   Social Connections:   . Frequency of Communication with Friends and Family:   . Frequency of Social Gatherings with Friends and Family:   . Attends Religious Services:   . Active Member of Clubs or Organizations:   . Attends Archivist Meetings:   Marland Kitchen Marital Status:   Intimate Partner Violence:   . Fear of Current or Ex-Partner:   . Emotionally Abused:   Marland Kitchen Physically Abused:   . Sexually Abused:     Family History  Problem Relation Age of Onset  . Diabetes Mother   . Diabetes Sister   . Diabetes Brother   . Colon cancer Neg Hx   . Esophageal cancer Neg Hx     The following portions of the patient's history were reviewed and updated as appropriate: allergies, current medications, past family history, past medical history, past social history, past surgical history and problem list.  Review of Systems Pertinent items are noted in HPI.   Objective:  BP 110/72   Pulse (!) 101   Wt 220 lb 1.3 oz (99.8 kg)   LMP  (LMP Unknown)   BMI 34.47 kg/m  Wt Readings from Last 3 Encounters:  12/17/19 220 lb 1.3 oz (99.8 kg)    09/24/19 215 lb 9.6 oz (97.8 kg)  01/31/19 217 lb (98.4 kg)     Chaperone present during exam  CONSTITUTIONAL: Well-developed, well-nourished female in no acute distress.  HENT:  Normocephalic, atraumatic, External right and left ear normal. Oropharynx is clear and moist EYES: Conjunctivae and EOM are normal. Pupils are equal, round, and reactive to light. No scleral icterus.  NECK: Normal range of motion, supple, no masses.  Normal thyroid.   CARDIOVASCULAR: Normal heart rate noted, regular rhythm RESPIRATORY: Clear to auscultation bilaterally. Effort and breath sounds normal, no problems with respiration noted. BREASTS: Symmetric in size. No masses, skin changes, nipple drainage, or lymphadenopathy. ABDOMEN: Soft, normal bowel sounds, no distention noted.  No tenderness, rebound or guarding.  PELVIC: Normal appearing external genitalia; normal appearing vaginal mucosa and cervix.  No abnormal discharge noted.  Normal uterine size, no other palpable masses, no uterine or adnexal tenderness. MUSCULOSKELETAL: Normal range of motion. No tenderness.  No cyanosis, clubbing, or edema.  2+ distal pulses. SKIN: Skin is warm and dry. No rash noted. Not diaphoretic. No erythema. No pallor. NEUROLOGIC: Alert and oriented to person, place, and time. Normal reflexes, muscle tone coordination. No cranial nerve deficit noted. PSYCHIATRIC: Normal mood and affect. Normal behavior. Normal judgment and thought content.  Assessment:  Annual gynecologic examination with pap smear   Plan:  1. Well Woman Exam Will follow up results of pap smear and manage accordingly. Mammogram scheduled STD testing discussed. Patient declined testing Discussed exercise and diet Calcium and Vitamin D supplementation discussed  1. Well woman exam with routine gynecological exam - Will fu pap results. - Mammogram ordered. - Calcium and Vitamin D supplementation recommened    Routine preventative health maintenance  measures emphasized. Please refer to After Visit Summary for other counseling recommendations.    Dwana Curd. [place], [place] for [facility]

## 2019-12-19 ENCOUNTER — Other Ambulatory Visit: Payer: Self-pay

## 2019-12-19 ENCOUNTER — Ambulatory Visit (INDEPENDENT_AMBULATORY_CARE_PROVIDER_SITE_OTHER): Payer: Medicare Other | Admitting: Family Medicine

## 2019-12-19 ENCOUNTER — Encounter: Payer: Self-pay | Admitting: Family Medicine

## 2019-12-19 VITALS — BP 114/80 | HR 90 | Temp 98.5°F | Ht 67.0 in | Wt 219.0 lb

## 2019-12-19 DIAGNOSIS — Z794 Long term (current) use of insulin: Secondary | ICD-10-CM | POA: Diagnosis not present

## 2019-12-19 DIAGNOSIS — E7800 Pure hypercholesterolemia, unspecified: Secondary | ICD-10-CM

## 2019-12-19 DIAGNOSIS — I639 Cerebral infarction, unspecified: Secondary | ICD-10-CM | POA: Diagnosis not present

## 2019-12-19 DIAGNOSIS — E1140 Type 2 diabetes mellitus with diabetic neuropathy, unspecified: Secondary | ICD-10-CM | POA: Diagnosis not present

## 2019-12-19 DIAGNOSIS — I10 Essential (primary) hypertension: Secondary | ICD-10-CM

## 2019-12-19 DIAGNOSIS — R0989 Other specified symptoms and signs involving the circulatory and respiratory systems: Secondary | ICD-10-CM | POA: Diagnosis not present

## 2019-12-19 LAB — CBC
HCT: 31.2 % — ABNORMAL LOW (ref 36.0–46.0)
Hemoglobin: 10.5 g/dL — ABNORMAL LOW (ref 12.0–15.0)
MCHC: 33.6 g/dL (ref 30.0–36.0)
MCV: 86.5 fl (ref 78.0–100.0)
Platelets: 426 10*3/uL — ABNORMAL HIGH (ref 150.0–400.0)
RBC: 3.6 Mil/uL — ABNORMAL LOW (ref 3.87–5.11)
RDW: 13.5 % (ref 11.5–15.5)
WBC: 6.4 10*3/uL (ref 4.0–10.5)

## 2019-12-19 LAB — COMPREHENSIVE METABOLIC PANEL
ALT: 13 U/L (ref 0–35)
AST: 17 U/L (ref 0–37)
Albumin: 3.9 g/dL (ref 3.5–5.2)
Alkaline Phosphatase: 81 U/L (ref 39–117)
BUN: 21 mg/dL (ref 6–23)
CO2: 26 mEq/L (ref 19–32)
Calcium: 9.3 mg/dL (ref 8.4–10.5)
Chloride: 103 mEq/L (ref 96–112)
Creatinine, Ser: 1.16 mg/dL (ref 0.40–1.20)
GFR: 55.42 mL/min — ABNORMAL LOW (ref >60.00)
Glucose, Bld: 145 mg/dL — ABNORMAL HIGH (ref 70–99)
Potassium: 4.3 mEq/L (ref 3.5–5.1)
Sodium: 136 mEq/L (ref 135–145)
Total Bilirubin: 0.4 mg/dL (ref 0.2–1.2)
Total Protein: 7.3 g/dL (ref 6.0–8.3)

## 2019-12-19 LAB — MICROALBUMIN / CREATININE URINE RATIO
Creatinine,U: 93.3 mg/dL
Microalb Creat Ratio: 1.4 mg/g (ref 0.0–30.0)
Microalb, Ur: 1.3 mg/dL (ref 0.0–1.9)

## 2019-12-19 LAB — LIPID PANEL
Cholesterol: 140 mg/dL (ref 0–200)
HDL: 37.4 mg/dL — ABNORMAL LOW (ref >39.00)
LDL Cholesterol: 79 mg/dL (ref 0–99)
NonHDL: 102.52
Total CHOL/HDL Ratio: 4
Triglycerides: 118 mg/dL (ref 0.0–149.0)
VLDL: 23.6 mg/dL (ref 0.0–40.0)

## 2019-12-19 LAB — LDL CHOLESTEROL, DIRECT: Direct LDL: 72 mg/dL

## 2019-12-19 LAB — HEMOGLOBIN A1C: Hgb A1c MFr Bld: 8.7 % — ABNORMAL HIGH (ref 4.6–6.5)

## 2019-12-19 NOTE — Progress Notes (Signed)
Established Patient Office Visit  Subjective:  Patient ID: Tara Conner, female    DOB: June 20, 1945  Age: 74 y.o. MRN: [id number]  CC: No chief complaint on file.   HPI Zeta Bucy presents for follow-up of her hypertension, diabetes and elevated cholesterol.  Continues with pain in Lantus and preprandial Humalog.  She is compliant with this she assures me.  She continues to admit to dietary indiscretions.  Status post great eye check with ophthalmology.  Continues high-dose atorvastatin and Plavix status post history of CVA.  Blood pressures well controlled with amlodipine.  back warm warm Past Medical History:  Diagnosis Date  . Diabetes mellitus without complication ([place])   . Diabetic retinopathy ([place])    PDR OU  . Hypertension   . Hypertensive retinopathy    OU  . Stroke [facility]) 2019    Past Surgical History:  Procedure Laterality Date  . CATARACT EXTRACTION Bilateral   . [place] possibly but not sure. Michela Pitcher it was negative  . ESOPHAGOGASTRODUODENOSCOPY     around the same time said it was negative   . EYE SURGERY Bilateral    Cat Sx OU  . GALLBLADDER SURGERY      Family History  Problem Relation Age of Onset  . Diabetes Mother   . Diabetes Sister   . Diabetes Brother   . Colon cancer Neg Hx   . Esophageal cancer Neg Hx     Social History   Socioeconomic History  . Marital status: Single    Spouse name: Not on file  . Number of children: Not on file  . Years of education: Not on file  . Highest education level: Not on file  Occupational History  . Not on file  Tobacco Use  . Smoking status: Never Smoker  . Smokeless tobacco: Never Used  Vaping Use  . Vaping Use: Never used  Substance and Sexual Activity  . Alcohol use: Never  . Drug use: Never  . Sexual activity: Not Currently  Other Topics Concern  . Not on file  Social History Narrative  . Not on file   Social Determinants of Health   Financial Resource  Strain:   . Difficulty of Paying Living Expenses:   Food Insecurity:   . Worried About Charity fundraiser in the Last Year:   . Arboriculturist in the Last Year:   Transportation Needs:   . Film/video editor (Medical):   Marland Kitchen Lack of Transportation (Non-Medical):   Physical Activity:   . Days of Exercise per Week:   . Minutes of Exercise per Session:   Stress:   . Feeling of Stress :   Social Connections:   . Frequency of Communication with Friends and Family:   . Frequency of Social Gatherings with Friends and Family:   . Attends Religious Services:   . Active Member of Clubs or Organizations:   . Attends Archivist Meetings:   Marland Kitchen Marital Status:   Intimate Partner Violence:   . Fear of Current or Ex-Partner:   . Emotionally Abused:   Marland Kitchen Physically Abused:   . Sexually Abused:     Outpatient Medications Prior to Visit  Medication Sig Dispense Refill  . amLODipine (NORVASC) 5 MG tablet TAKE 1 TABLET(5 MG) BY MOUTH DAILY 90 tablet 1  . Ascorbic Acid (VITAMIN C PO) Take 1 tablet by mouth as needed.    Marland Kitchen atorvastatin (LIPITOR) 80 MG tablet Take 1 tablet (  80 mg total) by mouth daily. 90 tablet 3  . Blood Glucose Monitoring Suppl (ACCU-CHEK AVIVA PLUS) w/Device KIT USE TO TEST BLOOD SUGAR TWICE DAILY 1 kit 1  . cimetidine (TAGAMET) 300 MG tablet TAKE 1 TABLET(300 MG) BY MOUTH AT BEDTIME 90 tablet 4  . clopidogrel (PLAVIX) 75 MG tablet TAKE 1 TABLET(75 MG) BY MOUTH DAILY 90 tablet 1  . ferrous sulfate 325 (65 FE) MG tablet Take 1 tablet (325 mg total) by mouth daily with breakfast. 90 tablet 1  . gabapentin (NEURONTIN) 300 MG capsule TAKE ONE CAPSULE BY MOUTH AT NIGHT FOR 1 WEEK AND THEN INCREASE TO 1 CAPSULE TWICE DAILY AS TOLERATED 60 capsule 3  . glucose blood test strip UAD qid to monitor blood glucose/Dx: E11.40 200 each 12  . Glycerin-Polysorbate 80 (REFRESH DRY EYE THERAPY OP) Place 1-2 drops into both eyes as needed (for dryness).     . insulin glargine (LANTUS)  100 unit/mL SOPN Inject 0.4 mLs (40 Units total) into the skin daily. 15 mL 11  . insulin lispro (HUMALOG) 100 UNIT/ML injection Inject 0-0.15 mLs (0-15 Units total) into the skin 3 (three) times daily with meals. 30 mL 4  . Insulin Pen Needle (ULTRA THIN PEN NEEDLES) 32G X 4 MM MISC To use with Lantus 100 each 1  . Insulin Syringe-Needle U-100 (INSULIN SYRINGE 1CC/31GX5/16") 31G X 5/16" 1 ML MISC USE AS DIRECTED WITH LANTUS 100 each 5  . Lancets (ACCU-CHEK SOFT TOUCH) lancets Use 4 times daily. Patient now using prandial insulin. 100 each PRN  . Multiple Vitamins-Minerals (MULTIVITAMIN ADULT PO) Take 1 tablet by mouth daily.    Marland Kitchen NEEDLE, DISP, 30 G (B-D DISP NEEDLE 30GX1") 30G X 1" MISC To use with Lantus. 100 each 3  . VITAMIN D PO Take 1 tablet by mouth daily.     No facility-administered medications prior to visit.    Allergies  Allergen Reactions  . Penicillins     Said she was young had a reaction   . Sulfa Antibiotics Other (See Comments)    Pt unable to report reaction Said she had a rash    ROS Review of Systems  Constitutional: Negative.   HENT: Negative.   Eyes: Negative for photophobia and visual disturbance.  Respiratory: Negative.   Cardiovascular: Negative.   Gastrointestinal: Negative.   Endocrine: Negative for polyphagia and polyuria.  Genitourinary: Negative.   Musculoskeletal: Negative for gait problem and joint swelling.  Skin: Negative for pallor and rash.  Allergic/Immunologic: Negative for immunocompromised state.  Neurological: Negative for light-headedness and numbness.  Hematological: Does not bruise/bleed easily.  Psychiatric/Behavioral: Negative.       Objective:    Physical Exam Vitals and nursing note reviewed.  Constitutional:      General: She is not in acute distress.    Appearance: Normal appearance. She is not ill-appearing, toxic-appearing or diaphoretic.  HENT:     Head: Normocephalic and atraumatic.     Right Ear: External ear  normal.     Left Ear: External ear normal.     Mouth/Throat:     Mouth: Mucous membranes are moist.     Pharynx: Oropharynx is clear. No oropharyngeal exudate or posterior oropharyngeal erythema.  Eyes:     General: No scleral icterus.       Right eye: No discharge.        Left eye: No discharge.     Extraocular Movements: Extraocular movements intact.     Conjunctiva/sclera: Conjunctivae normal.  Pupils: Pupils are equal, round, and reactive to light.  Cardiovascular:     Rate and Rhythm: Normal rate and regular rhythm.     Pulses:          Dorsalis pedis pulses are 0 on the right side and 1+ on the left side.       Posterior tibial pulses are 0 on the right side and 0 on the left side.  Pulmonary:     Effort: Pulmonary effort is normal.     Breath sounds: Normal breath sounds.  Abdominal:     General: Bowel sounds are normal.  Musculoskeletal:     Cervical back: No rigidity or tenderness.     Right lower leg: No edema.     Left lower leg: No edema.  Lymphadenopathy:     Cervical: No cervical adenopathy.  Skin:    General: Skin is warm and dry.  Neurological:     Mental Status: She is alert and oriented to person, place, and time.  Psychiatric:        Mood and Affect: Mood normal.    Diabetic Foot Exam - Simple   Simple Foot Form Diabetic Foot exam was performed with the following findings: Yes 12/19/2019 12:16 PM  Visual Inspection See comments: Yes Sensation Testing Intact to touch and monofilament testing bilaterally: Yes Pulse Check See comments: Yes Comments  Bilateral feet are planus.  There is a corn on the lateral left fifth toe.     BP 114/80 (BP Location: Left Arm, Patient Position: Sitting, Cuff Size: Large)   Pulse 90   Temp 98.5 F (36.9 C) (Oral)   Ht '5\' 7"'  (1.702 m)   Wt (!) 219 lb (99.3 kg)   LMP  (LMP Unknown)   SpO2 96%   BMI 34.30 kg/m  Wt Readings from Last 3 Encounters:  12/19/19 (!) 219 lb (99.3 kg)  12/17/19 220 lb 1.3 oz (99.8  kg)  09/24/19 215 lb 9.6 oz (97.8 kg)     Health Maintenance Due  Topic Date Due  . TETANUS/TDAP  Never done  . MAMMOGRAM  Never done  . COLONOSCOPY  Never done  . DEXA SCAN  Never done  . PNA vac Low Risk Adult (1 of 2 - PCV13) Never done    There are no preventive care reminders to display for this patient.  Lab Results  Component Value Date   TSH 1.22 08/02/2018   Lab Results  Component Value Date   WBC 6.0 09/24/2019   HGB 10.2 (L) 09/24/2019   HCT 30.8 (L) 09/24/2019   MCV 85.7 09/24/2019   PLT 393.0 09/24/2019   Lab Results  Component Value Date   NA 137 09/24/2019   K 3.9 09/24/2019   CO2 25 09/24/2019   GLUCOSE 120 (H) 09/24/2019   BUN 23 09/24/2019   CREATININE 1.20 09/24/2019   BILITOT 0.4 09/24/2019   ALKPHOS 87 09/24/2019   AST 19 09/24/2019   ALT 15 09/24/2019   PROT 7.4 09/24/2019   ALBUMIN 3.8 09/24/2019   CALCIUM 8.9 09/24/2019   ANIONGAP 5 04/09/2018   GFR 53.33 (L) 09/24/2019   Lab Results  Component Value Date   CHOL 141 09/24/2019   Lab Results  Component Value Date   HDL 37.60 (L) 09/24/2019   Lab Results  Component Value Date   LDLCALC 84 09/24/2019   Lab Results  Component Value Date   TRIG 98.0 09/24/2019   Lab Results  Component Value Date   CHOLHDL  4 09/24/2019   Lab Results  Component Value Date   HGBA1C 9.6 (H) 09/24/2019      Assessment & Plan:   Problem List Items Addressed This Visit      Cardiovascular and Mediastinum   Essential hypertension - Primary   Relevant Orders   CBC   Comprehensive metabolic panel   Cerebrovascular accident (CVA) ([place])   Relevant Orders   Lipid panel   LDL cholesterol, direct     Endocrine   Type 2 diabetes mellitus with diabetic neuropathy, with long-term current use of insulin (HCC)   Relevant Orders   Hemoglobin A1c   Microalbumin / creatinine urine ratio     Other   Elevated LDL cholesterol level   Relevant Orders   Lipid panel   LDL cholesterol, direct      Other Visit Diagnoses    Decreased pedal pulses       Relevant Orders   VAS [place] ABI WITH/WO TBI      No orders of the defined types were placed in this encounter.   Follow-up: Return in about 3 months (around 03/20/2020).    Libby Maw, MD

## 2019-12-20 LAB — CYTOLOGY - PAP
Comment: NEGATIVE
Diagnosis: NEGATIVE
High risk HPV: NEGATIVE

## 2019-12-25 DIAGNOSIS — H5213 Myopia, bilateral: Secondary | ICD-10-CM | POA: Diagnosis not present

## 2019-12-30 IMAGING — MR MR CERVICAL SPINE W/O CM
4 of 5 series · 19 of 48 positions shown · non-contrast
Comparison: Neck CT 04/07/2018

CLINICAL DATA: Three-week history syncope, nausea, vomiting and
headaches and neck pain.

EXAM:
MRI CERVICAL SPINE WITHOUT CONTRAST
TECHNIQUE: Multiplanar, multisequence MR imaging of the cervical spine was
performed. No intravenous contrast was administered.

[Series 4: T2 · sagittal · 3.0mm · 0.43mm/px · 7 of 15 slices shown (1 of 2)]
[im 1/15]
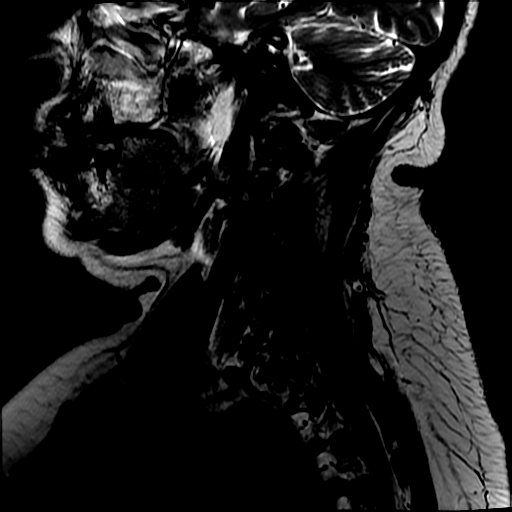
[im 3/15]
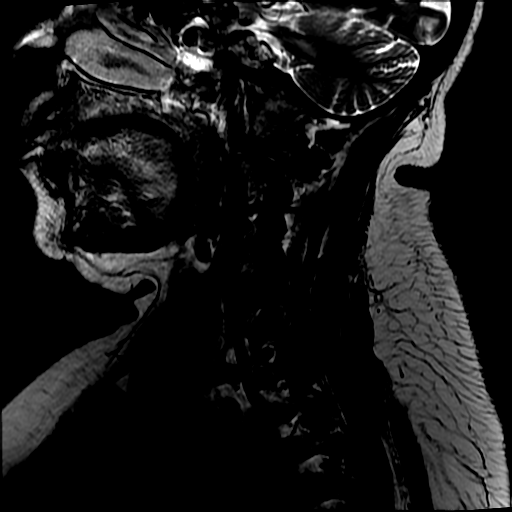
[im 5/15]
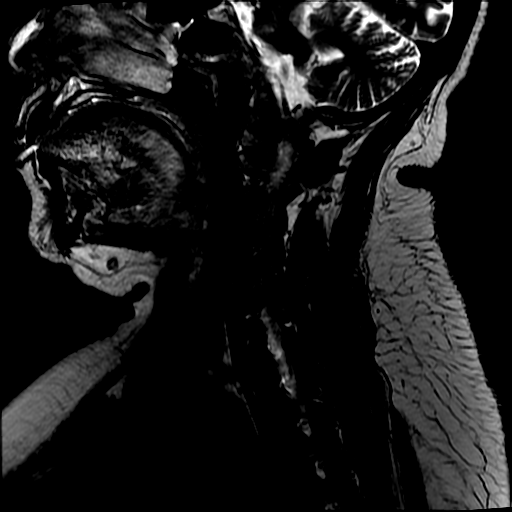
[im 8/15]
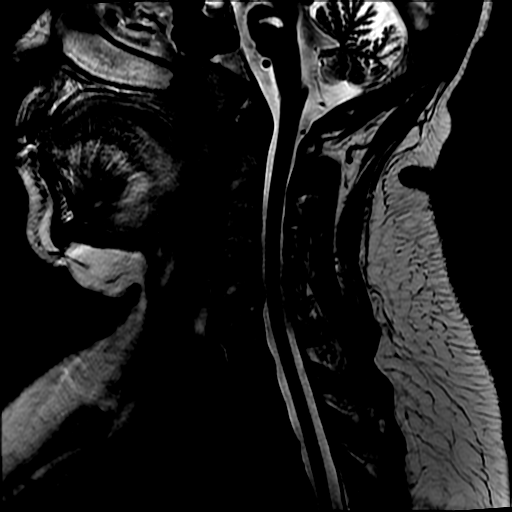
[im 10/15]
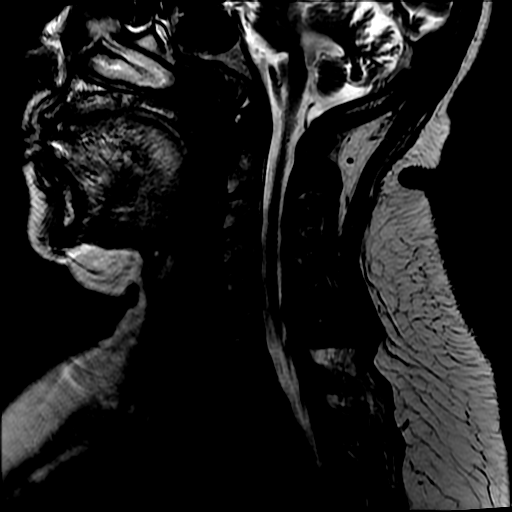
[im 12/15]
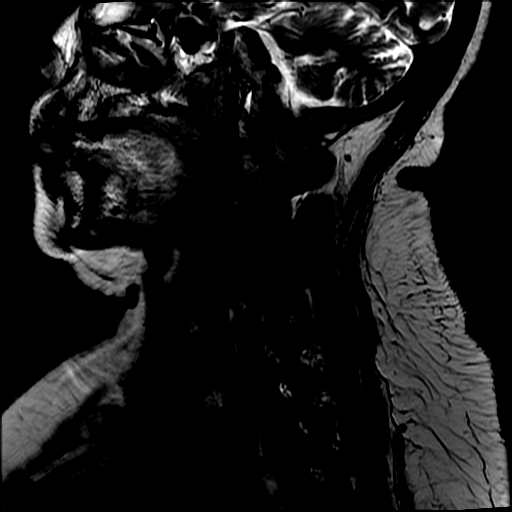
[im 15/15]
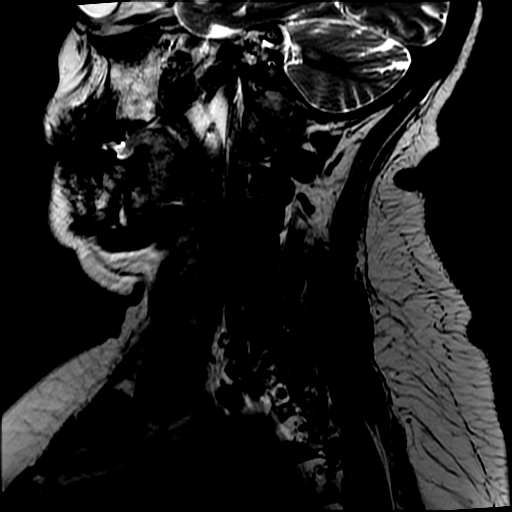

[Series 5: FLAIR · sagittal · 3.0mm · 0.43mm/px · 3 of 15 slices shown]
[im 3/15]
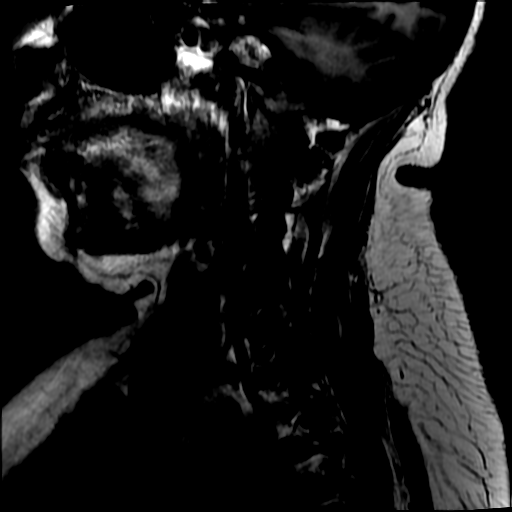
[im 8/15]
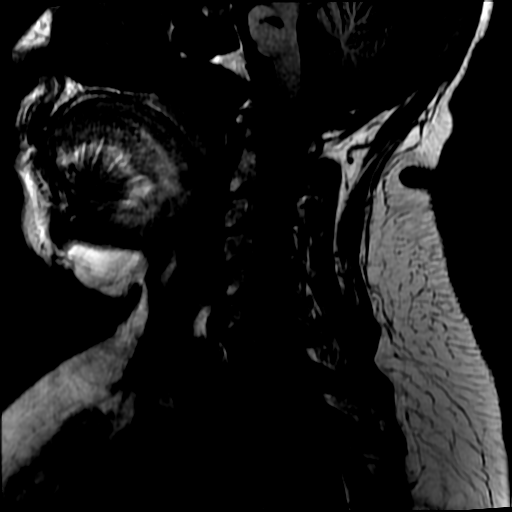
[im 12/15]
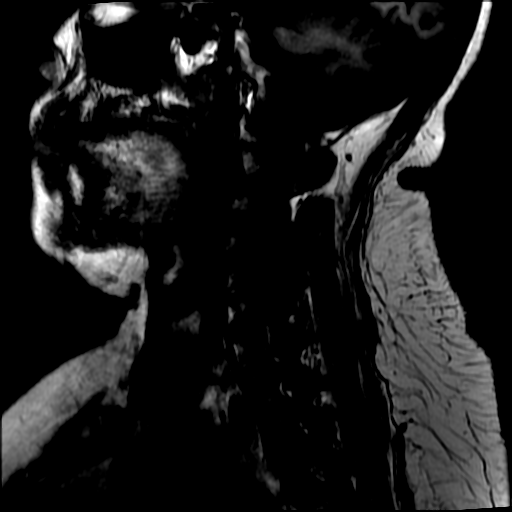

[Series 6: STIR · sagittal · 3.0mm · 0.43mm/px · 3 of 15 slices shown]
[im 3/15]
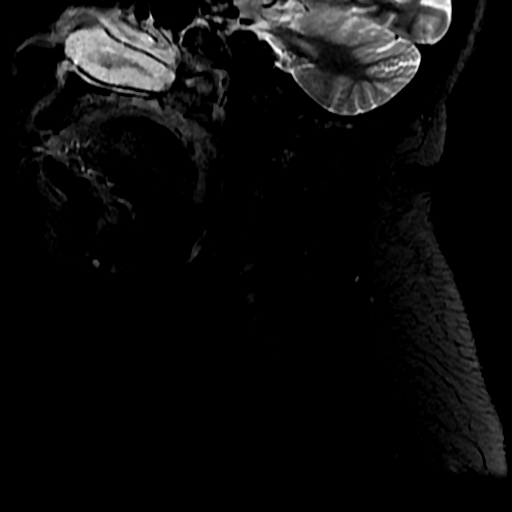
[im 9/15]
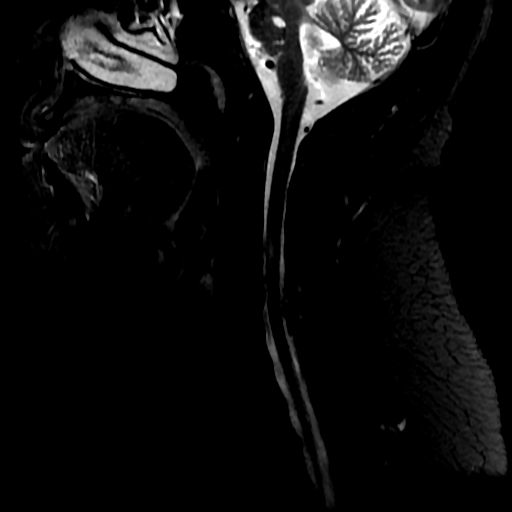
[im 15/15]
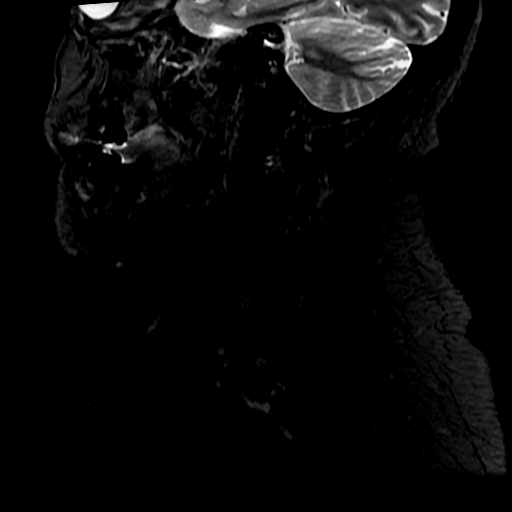

[Series 8: T2 · axial · 3.0mm · 0.35mm/px · z∈[-225,-134]mm · 6 of 34 slices shown (2 of 2)]
[im 1/34]
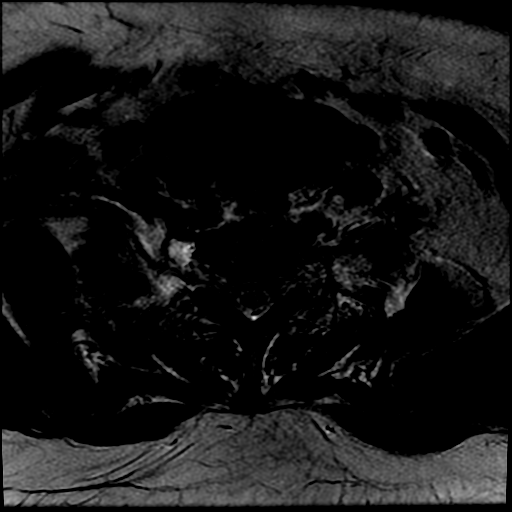
[im 6/34]
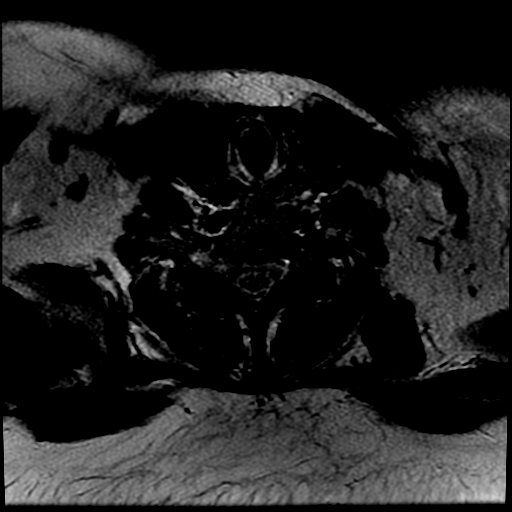
[im 11/34]
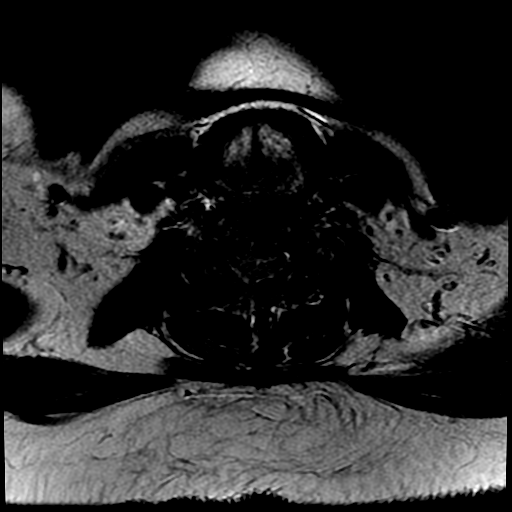
[im 16/34]
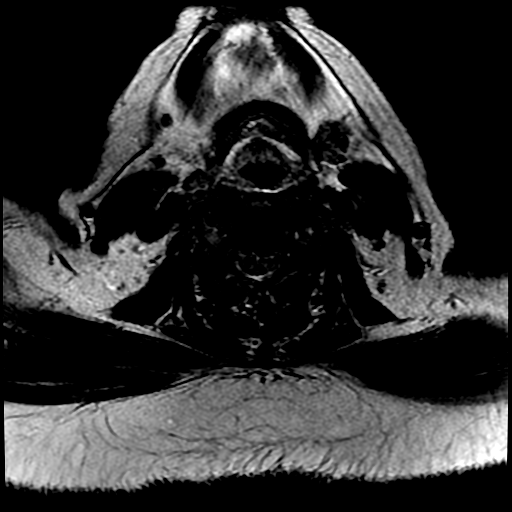
[im 18/34]
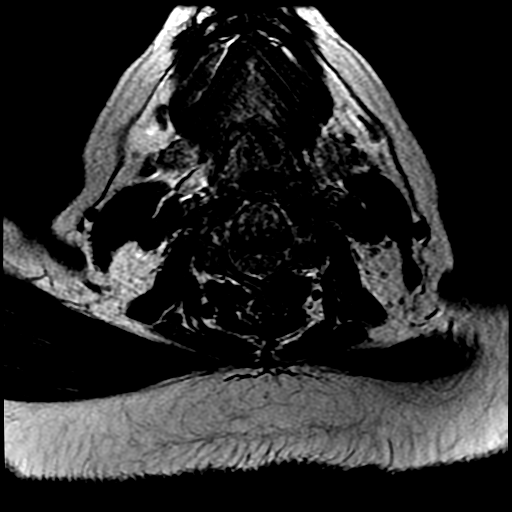
[im 28/34]
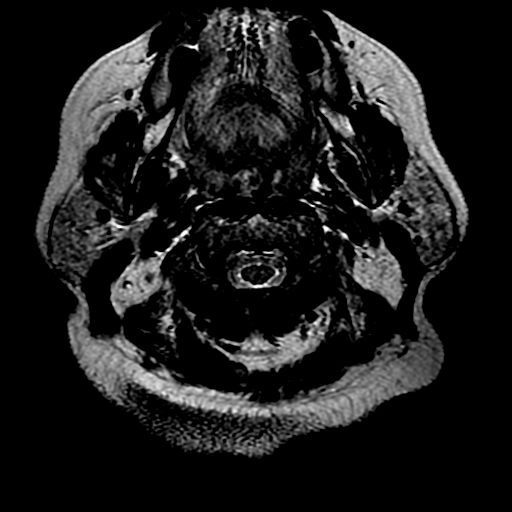

[19 of 48 positions shown; findings below may reference images not displayed]

FINDINGS: Alignment: Normal

Vertebrae: Endplate reactive changes but no bone lesions or
fractures.

Cord: Moderate artifact through the cord but no definite cord
lesions or syrinx.

Posterior Fossa, vertebral arteries, paraspinal tissues: Evidence of
white matter disease and or prior brainstem infarcts. Moderate
cerebellar atrophy. Moderate degenerative changes at C1-2 but no
significant pannus formation or mass effect on the upper cervical
cord.

Disc levels:

C2-3: No significant findings.

C3-4: Very shallow central disc protrusion with minimal impression
on the ventral thecal sac. No spinal or foraminal stenosis.

C4-5: No significant findings.

C5-6: Moderate disc disease and facet disease. Shallow central disc
protrusion with focal mass effect on the ventral thecal sac and
slight narrowing the ventral CSF space. No foraminal stenosis.

C6-7: No significant findings.

C7-T1: Shallow central disc protrusion with mild focal impression on
the ventral thecal sac. No foraminal stenosis.
IMPRESSION: 1. Shallow central disc protrusions at C3-4, C5-6 and C7-T1 without
significant neural compression.
2. No findings for foraminal stenosis.

## 2020-01-15 ENCOUNTER — Other Ambulatory Visit: Payer: Self-pay

## 2020-01-15 ENCOUNTER — Other Ambulatory Visit: Payer: Self-pay | Admitting: Obstetrics and Gynecology

## 2020-01-15 ENCOUNTER — Ambulatory Visit (HOSPITAL_COMMUNITY)
Admission: RE | Admit: 2020-01-15 | Discharge: 2020-01-15 | Disposition: A | Discharge status: Home / Self Care | Payer: Medicare Other | Source: Ambulatory Visit | Attending: Cardiology | Admitting: Cardiology

## 2020-01-15 DIAGNOSIS — Z0000 Encounter for general adult medical examination without abnormal findings: Secondary | ICD-10-CM

## 2020-01-15 DIAGNOSIS — R0989 Other specified symptoms and signs involving the circulatory and respiratory systems: Secondary | ICD-10-CM | POA: Diagnosis not present

## 2020-01-28 DIAGNOSIS — Z961 Presence of intraocular lens: Secondary | ICD-10-CM | POA: Diagnosis not present

## 2020-01-31 ENCOUNTER — Ambulatory Visit
Admission: RE | Admit: 2020-01-31 | Discharge: 2020-01-31 | Disposition: A | Discharge status: Home / Self Care | Payer: Medicare Other | Source: Ambulatory Visit | Attending: Obstetrics and Gynecology | Admitting: Obstetrics and Gynecology

## 2020-01-31 ENCOUNTER — Other Ambulatory Visit: Payer: Self-pay

## 2020-01-31 DIAGNOSIS — Z1231 Encounter for screening mammogram for malignant neoplasm of breast: Secondary | ICD-10-CM | POA: Diagnosis not present

## 2020-01-31 DIAGNOSIS — Z0000 Encounter for general adult medical examination without abnormal findings: Secondary | ICD-10-CM

## 2020-02-07 ENCOUNTER — Ambulatory Visit: Payer: Medicare Other | Admitting: Nurse Practitioner

## 2020-02-07 ENCOUNTER — Other Ambulatory Visit (INDEPENDENT_AMBULATORY_CARE_PROVIDER_SITE_OTHER): Payer: Medicare Other

## 2020-02-07 ENCOUNTER — Encounter: Payer: Self-pay | Admitting: Nurse Practitioner

## 2020-02-07 ENCOUNTER — Telehealth: Payer: Self-pay | Admitting: General Surgery

## 2020-02-07 ENCOUNTER — Ambulatory Visit (INDEPENDENT_AMBULATORY_CARE_PROVIDER_SITE_OTHER): Payer: Medicare Other | Admitting: Nurse Practitioner

## 2020-02-07 VITALS — BP 110/60 | HR 103 | Ht 67.0 in | Wt 210.0 lb

## 2020-02-07 DIAGNOSIS — D509 Iron deficiency anemia, unspecified: Secondary | ICD-10-CM

## 2020-02-07 LAB — CBC
HCT: 32.9 % — ABNORMAL LOW (ref 36.0–46.0)
Hemoglobin: 11 g/dL — ABNORMAL LOW (ref 12.0–15.0)
MCHC: 33.4 g/dL (ref 30.0–36.0)
MCV: 86 fl (ref 78.0–100.0)
Platelets: 382 10*3/uL (ref 150.0–400.0)
RBC: 3.82 Mil/uL — ABNORMAL LOW (ref 3.87–5.11)
RDW: 13.3 % (ref 11.5–15.5)
WBC: 7.5 10*3/uL (ref 4.0–10.5)

## 2020-02-07 LAB — FERRITIN: Ferritin: 86.7 ng/mL (ref 10.0–291.0)

## 2020-02-07 MED ORDER — CLENPIQ 10-3.5-12 MG-GM -GM/160ML PO SOLN: 1.0000 | ORAL | 0 refills | Status: DC

## 2020-02-07 NOTE — Patient Instructions (Addendum)
If you are age 73 or older, your body mass index should be between 23-30. Your Body mass index is 32.89 kg/m. If this is out of the aforementioned range listed, please consider follow up with your Primary Care Provider.  If you are age 51 or younger, your body mass index should be between 19-25. Your Body mass index is 32.89 kg/m. If this is out of the aformentioned range listed, please consider follow up with your Primary Care Provider.   Your provider has requested that you go to the basement level for lab work before leaving today. Press "B" on the elevator. The lab is located at the first door on the left as you exit the elevator.   You will be contaced by our office prior to your procedure for directions on holding your Plavix.  If you do not hear from our office 1 week prior to your scheduled procedure, please call [phone] to discuss.   We have sent the following medications to your pharmacy for you to pick up at your convenience: Clenpiq for colonoscopy preparation   Due to recent changes in healthcare laws, you may see the results of your imaging and laboratory studies on MyChart before your provider has had a chance to review them.  We understand that in some cases there may be results that are confusing or concerning to you. Not all laboratory results come back in the same time frame and the provider may be waiting for multiple results in order to interpret others.  Please give [place] 48 hours in order for your provider to thoroughly review all the results before contacting the office for clarification of your results.   Thank you for choosing [facility] Arnaldo Natal, CRNP

## 2020-02-07 NOTE — Progress Notes (Signed)
02/07/2020 Tara Conner [id number] 24-Jan-1946   Chief Complaint: Schedule an EGD and colonoscopy  History of Present Illness: history of hypertension, hyperlipidemia, CVA (on Plavix), DM II and IDA.  She underwent a televisit by Dr. Bryan Lemma on 11/02/2018 due to the Covid pandemic.  At that time, she was referred to our office for further evaluation for iron deficiency anemia.  She was initially diagnosed with iron deficiency anemia 01/2018 with a iron level of 30, TIBC 353, iron saturation 8% and ferritin 14.  She took iron p.o. daily and her iron levels improved.  B12 and folate levels were reported as normal.  She reported undergoing an EGD and colonoscopy 3 years ago in [place], she is not sure why the procedures were done at that time and she does not recall the results.  No significant abnormalities were identified.  No family history of esophageal, gastric or colon cancer.  Sister with breast cancer.  She presents today to reschedule an EGD and colonoscopy.  She had heartburn for a few days 1 week ago relieved by Tums.  She is also taking Cimetidine 300 mg at bedtime.  She typically does not have heartburn.  No dysphagia or upper abdominal pain.  No lower abdominal pain.  She occasionally strains to pass a bowel movement.  She saw a small amount of bright red blood on the toilet tissue 3 to 4 weeks ago after straining.  She typically passes a normal brown formed bowel movement.  No melena.  She remains on ferrous sulfate 325 mg once daily.  She is on Plavix 75 mg daily due to having TIA/stroke.  No other complaints today.   CBC Latest Ref Rng & Units 12/19/2019 09/24/2019 01/31/2019  WBC 4.0 - 10.5 K/uL 6.4 6.0 6.9  Hemoglobin 12.0 - 15.0 g/dL 10.5(L) 10.2(L) 10.1(L)  Hematocrit 36 - 46 % 31.2(L) 30.8(L) 29.9(L)  Platelets 150 - 400 K/uL 426.0(H) 393.0 449.0(H)  MCV 86.5.   CMP Latest Ref Rng & Units 12/19/2019 09/24/2019 01/31/2019  Glucose 70 - 99 mg/dL 145(H) 120(H) 191(H)  BUN 6 - 23  mg/dL _0 Creatinine 0.40 - 1.20 mg/dL 1.16 1.20 1.06  Sodium 135 - 145 mEq/L 136 137 137  Potassium 3.5 - 5.1 mEq/L 4.3 3.9 3.9  Chloride 96 - 112 mEq/L 103 103 101  CO2 19 - 32 mEq/L _1 Calcium 8.4 - 10.5 mg/dL 9.3 8.9 9.4  Total Protein 6.0 - 8.3 g/dL 7.3 7.4 7.8  Total Bilirubin 0.2 - 1.2 mg/dL 0.4 0.4 0.4  Alkaline Phos 39 - 117 U/L 81 87 90  AST 0 - 37 U/L _2 ALT 0 - 35 U/L _3 Current Outpatient Medications on File Prior to Visit  Medication Sig Dispense Refill  . amLODipine (NORVASC) 5 MG tablet TAKE 1 TABLET(5 MG) BY MOUTH DAILY 90 tablet 1  . Ascorbic Acid (VITAMIN C PO) Take 1 tablet by mouth as needed.    Marland Kitchen atorvastatin (LIPITOR) 80 MG tablet Take 1 tablet (80 mg total) by mouth daily. 90 tablet 3  . Blood Glucose Monitoring Suppl (ACCU-CHEK AVIVA PLUS) w/Device KIT USE TO TEST BLOOD SUGAR TWICE DAILY 1 kit 1  . cimetidine (TAGAMET) 300 MG tablet TAKE 1 TABLET(300 MG) BY MOUTH AT BEDTIME 90 tablet 4  . clopidogrel (PLAVIX) 75 MG tablet TAKE 1 TABLET(75 MG) BY MOUTH DAILY 90 tablet 1  . ferrous sulfate 325 (65 FE) MG tablet  Take 1 tablet (325 mg total) by mouth daily with breakfast. 90 tablet 1  . gabapentin (NEURONTIN) 300 MG capsule TAKE ONE CAPSULE BY MOUTH AT NIGHT FOR 1 WEEK AND THEN INCREASE TO 1 CAPSULE TWICE DAILY AS TOLERATED 60 capsule 3  . glucose blood test strip UAD qid to monitor blood glucose/Dx: E11.40 200 each 12  . Glycerin-Polysorbate 80 (REFRESH DRY EYE THERAPY OP) Place 1-2 drops into both eyes as needed (for dryness).     . insulin glargine (LANTUS) 100 unit/mL SOPN Inject 0.4 mLs (40 Units total) into the skin daily. 15 mL 11  . insulin lispro (HUMALOG) 100 UNIT/ML injection Inject 0-0.15 mLs (0-15 Units total) into the skin 3 (three) times daily with meals. 30 mL 4  . Insulin Pen Needle (ULTRA THIN PEN NEEDLES) 32G X 4 MM MISC To use with Lantus 100 each 1  . Insulin Syringe-Needle U-100 (INSULIN SYRINGE 1CC/31GX5/16") 31G X  5/16" 1 ML MISC USE AS DIRECTED WITH LANTUS 100 each 5  . Lancets (ACCU-CHEK SOFT TOUCH) lancets Use 4 times daily. Patient now using prandial insulin. 100 each PRN  . Multiple Vitamins-Minerals (MULTIVITAMIN ADULT PO) Take 1 tablet by mouth daily.    Marland Kitchen NEEDLE, DISP, 30 G (B-D DISP NEEDLE 30GX1") 30G X 1" MISC To use with Lantus. 100 each 3  . VITAMIN D PO Take 1 tablet by mouth daily.     No current facility-administered medications on file prior to visit.     Allergies  Allergen Reactions  . Penicillins     Said she was young had a reaction   . Sulfa Antibiotics Other (See Comments)    Pt unable to report reaction Said she had a rash     Current Medications, Allergies, Past Medical History, Past Surgical History, Family History and Social History were reviewed in [employer] record.   Review of Systems:   Constitutional: Negative for fever, sweats, chills or weight loss.  Respiratory: Negative for shortness of breath.   Cardiovascular: Negative for chest pain, palpitations and leg swelling.  Gastrointestinal: See HPI.  Musculoskeletal: Negative for back pain or muscle aches.  Neurological: Negative for dizziness, headaches or paresthesias.    Physical Exam: BP 110/60   Pulse (!) 103   Ht _0  (1.702 m)   Wt 210 lb (95.3 kg)   LMP  (LMP Unknown)   BMI 32.89 kg/m  General: Well developed 74 year old female in no acute distress. Head: Normocephalic and atraumatic. Eyes: No scleral icterus. Conjunctiva pink . Ears: Normal auditory acuity. Mouth: Dentition intact. No ulcers or lesions.  Lungs: Clear throughout to auscultation. Heart: Regular rate and rhythm, no murmur. Abdomen: Soft, nontender and nondistended. No masses or hepatomegaly. Normal bowel sounds x 4 quadrants. Small umbilical hernia.  Rectal: Deferred.  Musculoskeletal: Symmetrical with no gross deformities. Extremities: No edema. Neurological: Alert oriented x 4. No focal deficits.    Psychological: Alert and cooperative. Normal mood and affect  Assessment and Recommendations:  11.  74 year old female with a history of iron deficiency anemia. Hg 10.5. HCT 31.2. MCV 86.5. on 12/19/2019. -EGD and colonoscopy benefits and risks discussed including risk with sedation, risk of bleeding, perforation and infection  -CBC, iron, iron saturation, TIBC and ferritin level.  B12 and folate. -Further recommendations to be determined after the above evaluation completed  2.  History of CVA/TIA on Plavix. -Our patient will contact the patient's primary care physician to verify Plavix instructions prior to her EGD and  colonoscopy  3. DM II

## 2020-02-07 NOTE — Telephone Encounter (Signed)
 [facility] Pre-operative Risk Assessment     Request for surgical clearance:     Endoscopy Procedure  What type of surgery is being performed?     EGD/Colonoscopy  When is this surgery scheduled?     03/10/2020  What type of clearance is required ?   Pharmacy  Are there any medications that need to be held prior to surgery and how long? Plavix  Practice name and name of physician performing surgery?      [facility]  What is your office phone and fax number?      Phone- [phone]  [phone]  Anesthesia type (None, local, MAC, general) ?       MAC

## 2020-02-08 LAB — IRON, TOTAL/TOTAL IRON BINDING CAP
%SAT: 15 % (calc) — ABNORMAL LOW (ref 16–45)
Iron: 49 ug/dL (ref 45–160)
TIBC: 328 mcg/dL (calc) (ref 250–450)

## 2020-02-10 NOTE — Progress Notes (Signed)
Agree with the assessment and plan as outlined by Colleen Kennedy-Smith, NP.   Yukari Flax, DO, FACG  Gastroenterology   

## 2020-02-11 ENCOUNTER — Telehealth: Payer: Self-pay | Admitting: Family Medicine

## 2020-02-11 ENCOUNTER — Encounter: Payer: Self-pay | Admitting: General Surgery

## 2020-02-11 ENCOUNTER — Telehealth: Payer: Self-pay | Admitting: General Surgery

## 2020-02-11 NOTE — Telephone Encounter (Signed)
Error

## 2020-02-11 NOTE — Telephone Encounter (Signed)
Our records indicate that she had a stroke in November 2019.  We have not seen her since June.  If she is doing well without recurrent stroke or TIA symptoms she may hold Plavix 3 to 5 days prior to schedule procedure and restarted after the procedure when safe with a small but acceptable periprocedural risk of TIA/stroke if she is willing

## 2020-02-11 NOTE — Telephone Encounter (Signed)
Mrs. Occhipinti is new to [facility], she does not have prior cardiac history. She is on plavix for stroke that is managed by Neurology service. Please forward pharmacy clearance to neurology instead.

## 2020-02-11 NOTE — Progress Notes (Signed)
  Chronic Care Management   Outreach Note  02/11/2020 Name: Tara Conner MRN: [id number] DOB: 1945/06/10  Referred by: Mliss Sax, MD Reason for referral : No chief complaint on file.   An unsuccessful telephone outreach was attempted today. The patient was referred to the pharmacist for assistance with care management and care coordination.   Follow Up Plan:   Carley Perdue [employer] Scheduler

## 2020-02-17 NOTE — Telephone Encounter (Signed)
Patient instructed to stop her plavix 5 days prior to her procedure. Pt verbalized understanding.

## 2020-02-17 NOTE — Telephone Encounter (Signed)
Dr. Barron Alvine, refer to office visit 9/10. Patient is scheduled for EGD/colonoscopy with you on 03/10/2020. See msg below from Dr. Pearlean Brownie. Pls verify your preference to hold Plavix for 3 or 5 days. She was stable no recurrence of TIA/stroke sx when I saw her in office. THX

## 2020-02-17 NOTE — Telephone Encounter (Signed)
Agree with recommendations as written. If no CVA/TIA since 03/2018, agree with Neurologist recommendation for Plavix hold x5 days prior to procedures to allow for all diagnostic/therapeutic modalities. Thanks.

## 2020-02-17 NOTE — Telephone Encounter (Signed)
French Ana, pls contact the patient and let her know to hold her Plavix for 5 days prior to her procedures. Thx

## 2020-02-25 ENCOUNTER — Telehealth: Payer: Self-pay | Admitting: Family Medicine

## 2020-02-25 NOTE — Progress Notes (Signed)
  Chronic Care Management   Note  02/25/2020 Name: Tara Conner MRN: [id number] DOB: 1945/12/01  Tara Conner is a 74 y.o. year old female who is a primary care patient of Mliss Sax, MD. I reached out to Tara Conner by phone today in response to a referral sent by Ms. Coralie Keens PCP, Mliss Sax, MD.   Tara Conner was given information about Chronic Care Management services today including:  1. CCM service includes personalized support from designated clinical staff supervised by her physician, including individualized plan of care and coordination with other care providers 2. 24/7 contact phone numbers for assistance for urgent and routine care needs. 3. Service will only be billed when office clinical staff spend 20 minutes or more in a month to coordinate care. 4. Only one practitioner may furnish and bill the service in a calendar month. 5. The patient may stop CCM services at any time (effective at the end of the month) by phone call to the office staff.   Patient wishes to consider information provided and/or speak with a member of the care team before deciding about enrollment in care management services.   Follow up plan:   Carley Perdue [employer] Scheduler

## 2020-02-27 ENCOUNTER — Other Ambulatory Visit: Payer: Self-pay

## 2020-02-27 DIAGNOSIS — Z794 Long term (current) use of insulin: Secondary | ICD-10-CM

## 2020-02-27 DIAGNOSIS — I10 Essential (primary) hypertension: Secondary | ICD-10-CM

## 2020-02-27 MED ORDER — INSULIN LISPRO 100 UNIT/ML ~~LOC~~ SOLN
0.0000 [IU] | Freq: Three times a day (TID) | SUBCUTANEOUS | 4 refills | Status: DC
Start: 2020-02-27 — End: 2020-05-19

## 2020-02-27 MED ORDER — AMLODIPINE BESYLATE 5 MG PO TABS: ORAL_TABLET | ORAL | 0 refills | Status: DC

## 2020-02-27 MED ORDER — "INSULIN SYRINGE 31G X 5/16"" 1 ML MISC": 5 refills | Status: AC

## 2020-03-09 ENCOUNTER — Telehealth: Payer: Self-pay | Admitting: Nurse Practitioner

## 2020-03-09 ENCOUNTER — Other Ambulatory Visit: Payer: Self-pay

## 2020-03-09 ENCOUNTER — Telehealth: Payer: Self-pay | Admitting: Gastroenterology

## 2020-03-09 MED ORDER — CLENPIQ 10-3.5-12 MG-GM -GM/160ML PO SOLN: 1.0000 | ORAL | 0 refills | Status: DC

## 2020-03-09 NOTE — Progress Notes (Signed)
Resent clenpiq to pharmacy

## 2020-03-09 NOTE — Telephone Encounter (Signed)
Hi Dr. Barron Alvine, this pt just r/s her endocolon that was scheduled for tomorrow. She stated that she did not prepare well and was not able to get her prep and her pharmacy was not able to provide a reason why. She is diabetic so she ate this morning. She r/s to 11/2  At 11:00am.

## 2020-03-09 NOTE — Telephone Encounter (Signed)
Pt just r/s her procedure for tomorrow with Dr. Salena Saner. She stated that she tried to pick up her prep this past weekend but she was not able to  And her pharmacy did not give a reason why. Pt would like for [place] to call her pharmacy to find out. She r/s to 11/2 at 11:00am.

## 2020-03-10 ENCOUNTER — Encounter: Payer: Medicare Other | Admitting: Gastroenterology

## 2020-03-12 NOTE — Telephone Encounter (Signed)
I contacted patient pharmacy who indicates that patient's clenpiq is actually ready to be picked up. I contacted patient to make sure that she does, indeed use [facility] @ [address] She tells me she does. I advised that the prescription is ready for clenpiq so I am unsure who told her they did not have it. She tells me she is not sure either but then asks me what she is even supposed to be asking for at the pharmacy. I have spelled the name of the prescription for her and have also advised that she can just take a copy of her prep instructions with her to show the pharmacist since that has the name of the prep she needs. She verbalizes understanding and is very thankful for the help.

## 2020-03-24 ENCOUNTER — Other Ambulatory Visit: Payer: Self-pay | Admitting: Family Medicine

## 2020-03-24 DIAGNOSIS — I10 Essential (primary) hypertension: Secondary | ICD-10-CM

## 2020-03-31 ENCOUNTER — Other Ambulatory Visit: Payer: Self-pay

## 2020-03-31 ENCOUNTER — Ambulatory Visit (AMBULATORY_SURGERY_CENTER): Payer: Medicare Other | Admitting: Gastroenterology

## 2020-03-31 ENCOUNTER — Encounter: Payer: Self-pay | Admitting: Gastroenterology

## 2020-03-31 ENCOUNTER — Other Ambulatory Visit: Payer: Self-pay | Admitting: Gastroenterology

## 2020-03-31 VITALS — BP 139/80 | HR 96 | Temp 97.0°F | Resp 13 | Ht 67.0 in | Wt 210.0 lb

## 2020-03-31 DIAGNOSIS — B9681 Helicobacter pylori [H. pylori] as the cause of diseases classified elsewhere: Secondary | ICD-10-CM | POA: Diagnosis not present

## 2020-03-31 DIAGNOSIS — K31819 Angiodysplasia of stomach and duodenum without bleeding: Secondary | ICD-10-CM | POA: Diagnosis not present

## 2020-03-31 DIAGNOSIS — K259 Gastric ulcer, unspecified as acute or chronic, without hemorrhage or perforation: Secondary | ICD-10-CM | POA: Diagnosis not present

## 2020-03-31 DIAGNOSIS — K2970 Gastritis, unspecified, without bleeding: Secondary | ICD-10-CM | POA: Diagnosis not present

## 2020-03-31 DIAGNOSIS — K5520 Angiodysplasia of colon without hemorrhage: Secondary | ICD-10-CM

## 2020-03-31 DIAGNOSIS — K222 Esophageal obstruction: Secondary | ICD-10-CM

## 2020-03-31 DIAGNOSIS — D509 Iron deficiency anemia, unspecified: Secondary | ICD-10-CM

## 2020-03-31 DIAGNOSIS — K641 Second degree hemorrhoids: Secondary | ICD-10-CM

## 2020-03-31 DIAGNOSIS — K2950 Unspecified chronic gastritis without bleeding: Secondary | ICD-10-CM | POA: Diagnosis not present

## 2020-03-31 MED ORDER — PANTOPRAZOLE SODIUM 20 MG PO TBEC: 20.0000 mg | DELAYED_RELEASE_TABLET | Freq: Two times a day (BID) | ORAL | 0 refills | Status: DC

## 2020-03-31 MED ORDER — SODIUM CHLORIDE 0.9 % IV SOLN: 500.0000 mL | Freq: Once | INTRAVENOUS | Status: DC

## 2020-03-31 NOTE — Progress Notes (Signed)
Zofran given for vomiting during procedure. Physician aware.tb

## 2020-03-31 NOTE — Op Note (Signed)
[facility] Patient Name: Tara Conner Procedure Date: 03/31/2020 10:54 AM MRN: [id number] Endoscopist: Doristine Locks , MD Age: 74 Referring MD:  Date of Birth: Aug 16, 1945 Gender: Female Account #: [id number] Procedure:                Upper GI endoscopy Indications:              Iron deficiency anemia, Heartburn Medicines:                Monitored Anesthesia Care Procedure:                Pre-Anesthesia Assessment:                           - Prior to the procedure, a History and Physical                            was performed, and patient medications and                            allergies were reviewed. The patient's tolerance of                            previous anesthesia was also reviewed. The risks                            and benefits of the procedure and the sedation                            options and risks were discussed with the patient.                            All questions were answered, and informed consent                            was obtained. Prior Anticoagulants: The patient has                            taken Plavix (clopidogrel), last dose was 5 days                            prior to procedure. ASA Grade Assessment: III - A                            patient with severe systemic disease. After                            reviewing the risks and benefits, the patient was                            deemed in satisfactory condition to undergo the                            procedure.  After obtaining informed consent, the endoscope was                            passed under direct vision. Throughout the                            procedure, the patient's blood pressure, pulse, and                            oxygen saturations were monitored continuously. The                            Endoscope was introduced through the mouth, and                            advanced to the third part of duodenum. The upper                             GI endoscopy was accomplished without difficulty.                            The patient tolerated the procedure well. Scope In: Scope Out: Findings:                 A non-obstructing and mild Schatzki ring was found                            in the lower third of the esophagus.                           The examined esophagus was otherwise normal.                           The entire examined stomach was normal. Biopsies                            were taken with a cold forceps for Helicobacter                            pylori testing. Estimated blood loss was minimal.                           One non-bleeding superficial gastric ulcer with no                            stigmata of bleeding was found in the pyloric                            channel. The lesion was 4 mm in largest dimension.                           Two small angioectasias with typical arborization  were found in the duodenal bulb and in the second                            portion of the duodenum.                           Normal mucosa was found in the entire duodenum.                            Biopsies were taken with a cold forceps for                            histology. Estimated blood loss was minimal. Complications:            No immediate complications. Estimated Blood Loss:     Estimated blood loss was minimal. Impression:               - Non-obstructing and mild Schatzki ring.                           - Normal esophagus.                           - Normal stomach. Biopsied.                           - Non-bleeding gastric ulcer with no stigmata of                            bleeding.                           - Two angioectasias in the duodenum.                           - Normal mucosa was found in the entire examined                            duodenum. Biopsied. Recommendation:           - Patient has a contact number available for                             emergencies. The signs and symptoms of potential                            delayed complications were discussed with the                            patient. Return to normal activities tomorrow.                            Written discharge instructions were provided to the                            patient.                           -  Resume previous diet.                           - Continue present medications.                           - Await pathology results.                           - Perform a colonoscopy today.                           - Use Prilosec (omeprazole) 20 mg PO BID for 6                            weeks to promote healing of the ulcer.                           - If biopsies and colonsocopy otherwise                            unrevealing, will plan for Video Capsule Endoscopy                            (VCE) to further evaluate the small bowel for                            additional AVMs, followed by [facility]                            [facility]                            [facility]. Doristine LocksVito Quadasia Newsham, MD 03/31/2020 11:39:04 AM

## 2020-03-31 NOTE — Progress Notes (Signed)
Called to room to assist during endoscopic procedure.  Patient ID and intended procedure confirmed with present staff. Received instructions for my participation in the procedure from the performing physician.  

## 2020-03-31 NOTE — Progress Notes (Signed)
To PACU, VSS. Repor to RN.tb 

## 2020-03-31 NOTE — Patient Instructions (Addendum)
Handout was given to you on hemorrhoids. Use Protonix (Pantoprazole) 20 mg 2 x daily for 6 weeks to promote healing of the ulcer. Your blood sugar was 140 in the recovery room. Restart your PLAVIX today at prior dose. You may resume your current medications today. Await biopsy results. The office will call you with an office appointment with Dr. Barron Alvine. Repeat screening colonoscopy in 10 years. Please call if any questions or concerns.     YOU HAD AN ENDOSCOPIC PROCEDURE TODAY AT THE [facility]:   Refer to the procedure report that was given to you for any specific questions about what was found during the examination.  If the procedure report does not answer your questions, please call your gastroenterologist to clarify.  If you requested that your care partner not be given the details of your procedure findings, then the procedure report has been included in a sealed envelope for you to review at your convenience later.  YOU SHOULD EXPECT: Some feelings of bloating in the abdomen. Passage of more gas than usual.  Walking can help get rid of the air that was put into your GI tract during the procedure and reduce the bloating. If you had a lower endoscopy (such as a colonoscopy or flexible sigmoidoscopy) you may notice spotting of blood in your stool or on the toilet paper. If you underwent a bowel prep for your procedure, you may not have a normal bowel movement for a few days.  Please Note:  You might notice some irritation and congestion in your nose or some drainage.  This is from the oxygen used during your procedure.  There is no need for concern and it should clear up in a day or so.  SYMPTOMS TO REPORT IMMEDIATELY:   Following lower endoscopy (colonoscopy or flexible sigmoidoscopy):  Excessive amounts of blood in the stool  Significant tenderness or worsening of abdominal pains  Swelling of the abdomen that is new, acute  Fever of 100F or higher   Following  upper endoscopy (EGD)  Vomiting of blood or coffee ground material  New chest pain or pain under the shoulder blades  Painful or persistently difficult swallowing  New shortness of breath  Fever of 100F or higher  Black, tarry-looking stools  For urgent or emergent issues, a gastroenterologist can be reached at any hour by calling [phone]. Do not use MyChart messaging for urgent concerns.    DIET:  We do recommend a small meal at first, but then you may proceed to your regular diet.  Drink plenty of fluids but you should avoid alcoholic beverages for 24 hours.  ACTIVITY:  You should plan to take it easy for the rest of today and you should NOT DRIVE or use heavy machinery until tomorrow (because of the sedation medicines used during the test).    FOLLOW UP: Our staff will call the number listed on your records 48-72 hours following your procedure to check on you and address any questions or concerns that you may have regarding the information given to you following your procedure. If we do not reach you, we will leave a message.  We will attempt to reach you two times.  During this call, we will ask if you have developed any symptoms of COVID 19. If you develop any symptoms (ie: fever, flu-like symptoms, shortness of breath, cough etc.) before then, please call [phone].  If you test positive for Covid 19 in the 2 weeks post procedure, please call and  report this information to [place].    If any biopsies were taken you will be contacted by phone or by letter within the next 1-3 weeks.  Please call us at [phone] if you have not heard about the biopsies in 3 weeks.    SIGNATURES/CONFIDENTIALITY: You and/or your care partner have signed paperwork which will be entered into your electronic medical record.  These signatures attest to the fact that that the information above on your After Visit Summary has been reviewed and is understood.  Full responsibility of the confidentiality  of this discharge information lies with you and/or your care-partner.

## 2020-03-31 NOTE — Progress Notes (Addendum)
The is a contraindication with Prilosec and plavix.  Prilosec decreases the ability of plavix to inhibit platlet aggregation.  Reported to Dr. Doristine Locks.  Change rx to Protonix 9 pantoprazole) 20 mg BID for 6 weeks.  Rx was sent to [facility] on [address].  Per Dr. Barron Alvine he will not change the Colon report, for me to document in Epic in a progress note. Done. MAW  Pt was assisted dressing by MAW and [place]. maw   No problems noted in the recovery room. maw

## 2020-03-31 NOTE — Op Note (Signed)
[facility] Patient Name: Tara PaulsLillian Barbour Procedure Date: 03/31/2020 10:54 AM MRN: [id number] Endoscopist: Doristine LocksVito Irvine Glorioso , MD Age: 74 Referring MD:  Date of Birth: Jun 25, 1945 Gender: Female Account #: [id number] Procedure:                Colonoscopy Indications:              Hematochezia, Iron deficiency anemia Medicines:                Monitored Anesthesia Care Procedure:                Pre-Anesthesia Assessment:                           - Prior to the procedure, a History and Physical                            was performed, and patient medications and                            allergies were reviewed. The patient's tolerance of                            previous anesthesia was also reviewed. The risks                            and benefits of the procedure and the sedation                            options and risks were discussed with the patient.                            All questions were answered, and informed consent                            was obtained. Prior Anticoagulants: The patient has                            taken Plavix (clopidogrel), last dose was 5 days                            prior to procedure. ASA Grade Assessment: III - A                            patient with severe systemic disease. After                            reviewing the risks and benefits, the patient was                            deemed in satisfactory condition to undergo the                            procedure.  After obtaining informed consent, the colonoscope                            was passed under direct vision. Throughout the                            procedure, the patient's blood pressure, pulse, and                            oxygen saturations were monitored continuously. The                            Colonoscope was introduced through the anus and                            advanced to the the cecum, identified by                             appendiceal orifice and ileocecal valve. The                            colonoscopy was technically difficult and complex                            due to significant looping. The patient tolerated                            the procedure well. The quality of the bowel                            preparation was good. The ileocecal valve,                            appendiceal orifice, and rectum were photographed. Scope In: 11:10:28 AM Scope Out: 11:30:31 AM Scope Withdrawal Time: 0 hours 8 minutes 33 seconds  Total Procedure Duration: 0 hours 20 minutes 3 seconds  Findings:                 Hemorrhoids were found on perianal exam.                           The ascending colon revealed significantly                            excessive looping. Advancing the scope required                            applying abdominal pressure.                           Non-bleeding internal hemorrhoids were found during                            retroflexion. The hemorrhoids were small and Grade  II (internal hemorrhoids that prolapse but reduce                            spontaneously).                           The exam was otherwise normal throughout the                            remainder of the colon. Complications:            No immediate complications. Estimated Blood Loss:     Estimated blood loss: none. Impression:               - Hemorrhoids found on perianal exam.                           - There was significant looping of the colon.                           - Non-bleeding internal hemorrhoids.                           - No specimens collected. Recommendation:           - Patient has a contact number available for                            emergencies. The signs and symptoms of potential                            delayed complications were discussed with the                            patient. Return to normal activities tomorrow.                             Written discharge instructions were provided to the                            patient.                           - Resume previous diet.                           - Continue present medications.                           - Repeat colonoscopy in 10 years for screening                            purposes.                           - Return to GI office at appointment to be  scheduled.                           - Resume Plavix (clopidogrel) at prior dose today. Doristine Locks, MD 03/31/2020 11:42:36 AM

## 2020-04-02 ENCOUNTER — Telehealth: Payer: Self-pay | Admitting: *Deleted

## 2020-04-02 NOTE — Telephone Encounter (Signed)
  Follow up Call-  Call back number 03/31/2020  Post procedure Call Back phone  # [phone]  Permission to leave phone message Yes     Patient questions:  Do you have a fever, pain , or abdominal swelling? No. Pain Score  0 *  Have you tolerated food without any problems? Yes.    Have you been able to return to your normal activities? Yes.    Do you have any questions about your discharge instructions: Diet   No. Medications  No. Follow up visit  No.  Do you have questions or concerns about your Care? No.  Actions: * If pain score is 4 or above: No action needed, pain <4.  1. Have you developed a fever since your procedure? no  2.   Have you had an respiratory symptoms (SOB or cough) since your procedure? no  3.   Have you tested positive for COVID 19 since your procedure no  4.   Have you had any family members/close contacts diagnosed with the COVID 19 since your procedure?  no   If yes to any of these questions please route to Laverna Peace, RN and Karlton Lemon, RN

## 2020-04-07 ENCOUNTER — Encounter: Payer: Self-pay | Admitting: Family Medicine

## 2020-04-09 ENCOUNTER — Telehealth: Payer: Self-pay | Admitting: General Surgery

## 2020-04-09 DIAGNOSIS — K5520 Angiodysplasia of colon without hemorrhage: Secondary | ICD-10-CM

## 2020-04-09 DIAGNOSIS — B9681 Helicobacter pylori [H. pylori] as the cause of diseases classified elsewhere: Secondary | ICD-10-CM

## 2020-04-09 MED ORDER — DOXYCYCLINE HYCLATE 100 MG PO TABS: 100.0000 mg | ORAL_TABLET | Freq: Two times a day (BID) | ORAL | 0 refills | Status: AC

## 2020-04-09 MED ORDER — METRONIDAZOLE 250 MG PO TABS: 250.0000 mg | ORAL_TABLET | Freq: Four times a day (QID) | ORAL | 0 refills | Status: AC

## 2020-04-09 NOTE — Telephone Encounter (Signed)
-----   Message from Vito Cirigliano V, DO sent at 04/09/2020 12:11 PM EST ----- -The biopsies taken from your small intestine were normal and there was no evidence of Celiac Disease.   -The biopsies from the recent upper GI Endoscopy were notable for H. Pylori gastritis, and will plan on treating with quad therapy as below. Please confirm no medication allergies to the prescribed regimen.   1) Continue Omeprazole 20 mg 2 times a day as prescribed at the time of EGD 2) Pepto Bismol 2 tabs (262 mg each) 4 times a day x 14 d 3) Metronidazole 250 mg 4 times a day x 14 d 4) doxycycline 100 mg 2 times a day x 14 d  4 weeks after treatment completed, check H. Pylori stool antigen to confirm eradication (must be off acid suppression therapy)  Dx: H. Pylori gastritis  Follow-up in the GI clinic in 2 months.  Plan for repeat CBC and iron panel at that time.  If ongoing iron deficiency despite H. pylori eradication, can plan for VCE followed by possible enteroscopy at [facility] for now small bowel AVMs.   

## 2020-04-09 NOTE — Telephone Encounter (Signed)
Called the patient to discuss endoscopy results. Patients sister answered and stated she was not there and I could try back later today.  Sent rxs to pharmacy for flagyl and doxycycline. Need to advise her of the omeprazole and pepto bismal.  Placed an order for her to have a gi profile stool test done to make sure h pylori was erradicated. Can test after 05/25/2020 at [facility] lab  Scheduled f/u appt 06/13/2020 at 1120am order for CBC and Iron panel prior to visit.

## 2020-04-10 NOTE — Telephone Encounter (Signed)
Left a voicemail for the patient to contact me regarding her endoscopy results.

## 2020-04-13 ENCOUNTER — Telehealth: Payer: Self-pay | Admitting: General Surgery

## 2020-04-13 NOTE — Telephone Encounter (Signed)
-----   Message from [place] V, DO sent at 04/09/2020 12:11 PM EST ----- -The biopsies taken from your small intestine were normal and there was no evidence of Celiac Disease.   -The biopsies from the recent upper GI Endoscopy were notable for H. Pylori gastritis, and will plan on treating with quad therapy as below. Please confirm no medication allergies to the prescribed regimen.   1) Continue Omeprazole 20 mg 2 times a day as prescribed at the time of EGD 2) Pepto Bismol 2 tabs (262 mg each) 4 times a day x 14 d 3) Metronidazole 250 mg 4 times a day x 14 d 4) doxycycline 100 mg 2 times a day x 14 d  4 weeks after treatment completed, check H. Pylori stool antigen to confirm eradication (must be off acid suppression therapy)  Dx: H. Pylori gastritis  Follow-up in the GI clinic in 2 months.  Plan for repeat CBC and iron panel at that time.  If ongoing iron deficiency despite H. pylori eradication, can plan for VCE followed by possible enteroscopy at [facility] for now small bowel AVMs.

## 2020-04-13 NOTE — Telephone Encounter (Signed)
3rd message left on the patients voicemail to contact the office regarding her bx results and medications.

## 2020-04-13 NOTE — Telephone Encounter (Signed)
Sent letter to patient since she has not returned 3 calls

## 2020-04-30 NOTE — Telephone Encounter (Signed)
Patient returned call inquiring about results.  States did receive letter in the m,ail but has questions about it.  Please call patient.

## 2020-05-04 NOTE — Telephone Encounter (Signed)
Left a voicemail for the patient to contact the office and ask to speak with [place]

## 2020-05-08 NOTE — Telephone Encounter (Signed)
Pt is requesting a call back from a nurse (missed call) 

## 2020-05-19 ENCOUNTER — Ambulatory Visit (INDEPENDENT_AMBULATORY_CARE_PROVIDER_SITE_OTHER): Payer: Medicare Other

## 2020-05-19 ENCOUNTER — Telehealth: Payer: Self-pay

## 2020-05-19 VITALS — Ht 67.0 in | Wt 205.0 lb

## 2020-05-19 DIAGNOSIS — Z0000 Encounter for general adult medical examination without abnormal findings: Secondary | ICD-10-CM

## 2020-05-19 MED ORDER — INSULIN LISPRO (1 UNIT DIAL) 100 UNIT/ML (KWIKPEN)
0.0000 [IU] | PEN_INJECTOR | Freq: Three times a day (TID) | SUBCUTANEOUS | 3 refills | Status: DC
Start: 2020-05-19 — End: 2020-09-18

## 2020-05-19 MED ORDER — INSULIN GLARGINE 100 UNIT/ML SOLOSTAR PEN: 40.0000 [IU] | PEN_INJECTOR | Freq: Every day | SUBCUTANEOUS | 3 refills | Status: DC

## 2020-05-19 NOTE — Addendum Note (Signed)
Addended by: Worthy Rancher B on: 05/19/2020 03:17 PM   Modules accepted: Orders

## 2020-05-19 NOTE — Progress Notes (Signed)
Subjective:   Tara Conner is a 74 y.o. female who presents for Medicare Annual (Subsequent) preventive examination.  I connected with Tara Conner today by telephone and verified that I am speaking with the correct person using two identifiers. Location patient: home Location provider: work Persons participating in the virtual visit: patient, Marine scientist.    I discussed the limitations, risks, security and privacy concerns of performing an evaluation and management service by telephone and the availability of in person appointments. I also discussed with the patient that there may be a patient responsible charge related to this service. The patient expressed understanding and verbally consented to this telephonic visit.    Interactive audio and video telecommunications were attempted between this provider and patient, however failed, due to patient having technical difficulties OR patient did not have access to video capability.  We continued and completed visit with audio only.  Some vital signs may be absent or patient reported.   Time Spent with patient on telephone encounter: 45 minutes   Review of Systems     Cardiac Risk Factors include: advanced age (>72mn, >>63women);obesity (BMI >30kg/m2);diabetes mellitus;dyslipidemia;hypertension     Objective:    Today's Vitals   05/19/20 1333  Weight: 205 lb (93 kg)  Height: '5\' 7"'  (1.702 m)   Body mass index is 32.11 kg/m.  Advanced Directives 05/19/2020 05/15/2019 04/07/2018 04/07/2018 03/27/2018  Does Patient Have a Medical Advance Directive? No No No No No  Would patient like information on creating a medical advance directive? Yes (MAU/Ambulatory/Procedural Areas - Information given) No - Patient declined No - Patient declined Yes (ED - Information included in AVS) -    Current Medications (verified) Outpatient Encounter Medications as of 05/19/2020  Medication Sig  . amLODipine (NORVASC) 5 MG tablet TAKE 1 TABLET(5 MG) BY  MOUTH DAILY  . Ascorbic Acid (VITAMIN C PO) Take 1 tablet by mouth as needed.  .Marland Kitchenatorvastatin (LIPITOR) 80 MG tablet Take 1 tablet (80 mg total) by mouth daily.  . Blood Glucose Monitoring Suppl (ACCU-CHEK AVIVA PLUS) w/Device KIT USE TO TEST BLOOD SUGAR TWICE DAILY  . cimetidine (TAGAMET) 300 MG tablet TAKE 1 TABLET(300 MG) BY MOUTH AT BEDTIME  . clopidogrel (PLAVIX) 75 MG tablet TAKE 1 TABLET(75 MG) BY MOUTH DAILY  . ferrous sulfate 325 (65 FE) MG tablet Take 1 tablet (325 mg total) by mouth daily with breakfast.  . gabapentin (NEURONTIN) 300 MG capsule TAKE ONE CAPSULE BY MOUTH AT NIGHT FOR 1 WEEK AND THEN INCREASE TO 1 CAPSULE TWICE DAILY AS TOLERATED  . glucose blood test strip UAD qid to monitor blood glucose/Dx: E11.40  . Glycerin-Polysorbate 80 (REFRESH DRY EYE THERAPY OP) Place 1-2 drops into both eyes as needed (for dryness).   . insulin glargine (LANTUS) 100 unit/mL SOPN Inject 0.4 mLs (40 Units total) into the skin daily.  . insulin lispro (HUMALOG) 100 UNIT/ML injection Inject 0-0.15 mLs (0-15 Units total) into the skin 3 (three) times daily with meals.  . Insulin Pen Needle (ULTRA THIN PEN NEEDLES) 32G X 4 MM MISC To use with Lantus  . Insulin Syringe-Needle U-100 (INSULIN SYRINGE 1CC/31GX5/16") 31G X 5/16" 1 ML MISC USE AS DIRECTED WITH LANTUS  . Lancets (ACCU-CHEK SOFT TOUCH) lancets Use 4 times daily. Patient now using prandial insulin.  . Multiple Vitamins-Minerals (MULTIVITAMIN ADULT PO) Take 1 tablet by mouth daily.  .Marland KitchenNEEDLE, DISP, 30 G (B-D DISP NEEDLE 30GX1") 30G X 1" MISC To use with Lantus.  . pantoprazole (PROTONIX) 20 MG  tablet TAKE 1 TABLET BY MOUTH TWICE DAILY HALF AN HOURS OR 1 HOURS BEFORE EATING  . VITAMIN D PO Take 1 tablet by mouth daily.   No facility-administered encounter medications on file as of 05/19/2020.    Allergies (verified) Penicillins and Sulfa antibiotics   History: Past Medical History:  Diagnosis Date  . Diabetes mellitus without  complication ([place])   . Diabetic retinopathy ([place])    PDR OU  . Hypertension   . Hypertensive retinopathy    OU  . Stroke [facility]) 2019   Past Surgical History:  Procedure Laterality Date  . CATARACT EXTRACTION Bilateral   . [place] possibly but not sure. Michela Pitcher it was negative  . ESOPHAGOGASTRODUODENOSCOPY     around the same time said it was negative   . EYE SURGERY Bilateral    Cat Sx OU  . GALLBLADDER SURGERY     Family History  Problem Relation Age of Onset  . Diabetes Mother   . Diabetes Sister   . Diabetes Brother   . Colon cancer Neg Hx   . Esophageal cancer Neg Hx   . Rectal cancer Neg Hx   . Stomach cancer Neg Hx    Social History   Socioeconomic History  . Marital status: Single    Spouse name: Not on file  . Number of children: Not on file  . Years of education: Not on file  . Highest education level: Not on file  Occupational History  . Not on file  Tobacco Use  . Smoking status: Never Smoker  . Smokeless tobacco: Never Used  Vaping Use  . Vaping Use: Never used  Substance and Sexual Activity  . Alcohol use: Never  . Drug use: Never  . Sexual activity: Not Currently  Other Topics Concern  . Not on file  Social History Narrative  . Not on file   Social Determinants of Health   Financial Resource Strain: Low Risk   . Difficulty of Paying Living Expenses: Not hard at all  Food Insecurity: No Food Insecurity  . Worried About Charity fundraiser in the Last Year: Never true  . Ran Out of Food in the Last Year: Never true  Transportation Needs: No Transportation Needs  . Lack of Transportation (Medical): No  . Lack of Transportation (Non-Medical): No  Physical Activity: Inactive  . Days of Exercise per Week: 0 days  . Minutes of Exercise per Session: 0 min  Stress: No Stress Concern Present  . Feeling of Stress : Not at all  Social Connections: Moderately Isolated  . Frequency of Communication with Friends and Family:  More than three times a week  . Frequency of Social Gatherings with Friends and Family: More than three times a week  . Attends Religious Services: 1 to 4 times per year  . Active Member of Clubs or Organizations: No  . Attends Archivist Meetings: Never  . Marital Status: Divorced    Tobacco Counseling Counseling given: Not Answered   Clinical Intake:  Pre-visit preparation completed: Yes  Pain : No/denies pain     Nutritional Status: BMI > 30  Obese Nutritional Risks: None Diabetes: Yes CBG done?: No Did pt. bring in CBG monitor from home?: No (phone visit)  How often do you need to have someone help you when you read instructions, pamphlets, or other written materials from your doctor or pharmacy?: 1 - Never What is the last grade level you completed in  school?: 10th grade  Diabetes:  Is the patient diabetic?  Yes  If diabetic, was a CBG obtained today?  No  Did the patient bring in their glucometer from home?  No phone visit How often do you monitor your CBG's? daily.   Financial Strains and Diabetes Management:  Are you having any financial strains with the device, your supplies or your medication? No .  Does the patient want to be seen by Chronic Care Management for management of their diabetes?  No  Would the patient like to be referred to a Nutritionist or for Diabetic Management?  No   Diabetic Exams:  Diabetic Eye Exam: . Overdue for diabetic eye exam. Pt has been advised about the importance in completing this exam.   Diabetic Foot Exam: Completed 12/19/2019    Interpreter Needed?: No  Information entered by :: Caroleen Hamman LPN   Activities of Daily Living In your present state of health, do you have any difficulty performing the following activities: 05/19/2020  Hearing? N  Vision? N  Difficulty concentrating or making decisions? N  Walking or climbing stairs? N  Dressing or bathing? N  Doing errands, shopping? N  Preparing Food  and eating ? N  Using the Toilet? N  In the past six months, have you accidently leaked urine? Y  Comment occasionally at night  Do you have problems with loss of bowel control? N  Managing your Medications? N  Managing your Finances? N  Housekeeping or managing your Housekeeping? N  Some recent data might be hidden    Patient Care Team: Libby Maw, MD as PCP - General (Family Medicine)  Indicate any recent Medical Services you may have received from other than [facility] providers in the past year (date may be approximate).     Assessment:   This is a routine wellness examination for [place].  Hearing/Vision screen  Hearing Screening   '125Hz'  '250Hz'  '500Hz'  '1000Hz'  '2000Hz'  '3000Hz'  '4000Hz'  '6000Hz'  '8000Hz'   Right ear:           Left ear:           Comments: No issues  Vision Screening Comments: Wears glasses Last eye exam-6 months ago.  Dietary issues and exercise activities discussed: Current Exercise Habits: The patient does not participate in regular exercise at present, Exercise limited by: None identified  Goals    . DIET - REDUCE PORTION SIZE    . Patient Stated     Eat healthier & increase activity      Depression Screen PHQ 2/9 Scores 05/19/2020 05/15/2019  PHQ - 2 Score 0 0    Fall Risk Fall Risk  05/19/2020 05/15/2019 05/14/2018  Falls in the past year? 0 0 0  Number falls in past yr: 0 - -  Injury with Fall? 0 - -  Follow up Falls prevention discussed Education provided;Falls prevention discussed -    FALL RISK PREVENTION PERTAINING TO THE HOME:  Any stairs in or around the home? Yes  If so, are there any without handrails? No  Home free of loose throw rugs in walkways, pet beds, electrical cords, etc? Yes  Adequate lighting in your home to reduce risk of falls? Yes   ASSISTIVE DEVICES UTILIZED TO PREVENT FALLS:  Life alert? No  Use of a cane, walker or w/c? Yes  Grab bars in the bathroom? Yes  Shower chair or bench in shower? Yes  Elevated  toilet seat or a handicapped toilet? No   TIMED UP AND GO:  Was the test performed? No . Phone visit  Cognitive Function: Normal cognitive status assessed by this Nurse Health Advisor. No abnormalities found.          Immunizations Immunization History  Administered Date(s) Administered  . Influenza, High Dose Seasonal PF 03/07/2018  . Influenza,inj,Quad PF,6+ Mos 01/31/2019  . PFIZER SARS-COV-2 Vaccination 10/03/2019, 10/29/2019    TDAP status: Due, Education has been provided regarding the importance of this vaccine. Advised may receive this vaccine at local pharmacy or Health Dept. Aware to provide a copy of the vaccination record if obtained from local pharmacy or Health Dept. Verbalized acceptance and understanding.  Flu Vaccine status: Declined, Education has been provided regarding the importance of this vaccine but patient still declined. Advised may receive this vaccine at local pharmacy or Health Dept. Aware to provide a copy of the vaccination record if obtained from local pharmacy or Health Dept. Verbalized acceptance and understanding.  Pneumococcal vaccine status: Declined,  Education has been provided regarding the importance of this vaccine but patient still declined. Advised may receive this vaccine at local pharmacy or Health Dept. Aware to provide a copy of the vaccination record if obtained from local pharmacy or Health Dept. Verbalized acceptance and understanding.   Covid-19 vaccine status: Completed vaccines  Qualifies for Shingles Vaccine? Yes   Zostavax completed No   Shingrix Completed?: No.    Education has been provided regarding the importance of this vaccine. Patient has been advised to call insurance company to determine out of pocket expense if they have not yet received this vaccine. Advised may also receive vaccine at local pharmacy or Health Dept. Verbalized acceptance and understanding.  Screening Tests Health Maintenance  Topic Date Due  .  OPHTHALMOLOGY EXAM  01/18/1956  . TETANUS/TDAP  Never done  . DEXA SCAN  Never done  . PNA vac Low Risk Adult (1 of 2 - PCV13) Never done  . INFLUENZA VACCINE  12/29/2019  . COVID-19 Vaccine (3 - Booster for Pfizer series) 04/29/2020  . HEMOGLOBIN A1C  06/20/2020  . FOOT EXAM  12/18/2020  . URINE MICROALBUMIN  12/18/2020  . MAMMOGRAM  01/30/2022  . COLONOSCOPY  03/31/2030  . Hepatitis C Screening  Completed    Health Maintenance  Health Maintenance Due  Topic Date Due  . OPHTHALMOLOGY EXAM  01/18/1956  . TETANUS/TDAP  Never done  . DEXA SCAN  Never done  . PNA vac Low Risk Adult (1 of 2 - PCV13) Never done  . INFLUENZA VACCINE  12/29/2019  . COVID-19 Vaccine (3 - Booster for Pfizer series) 04/29/2020    Colorectal cancer screening: Type of screening: Colonoscopy. Completed 03/31/2020. Repeat every 10 years  Mammogram status: Completed Bilateral-01/31/2020. Repeat every year  Bone Density status: Due-Patient declined because she says she has had it this year. No documentation available.  Lung Cancer Screening: (Low Dose CT Chest recommended if Age 49-80 years, 30 pack-year currently smoking OR have quit w/in 15years.) does not qualify.     Additional Screening:  Hepatitis C Screening:  Completed 09/24/2019  Vision Screening: Recommended annual ophthalmology exams for early detection of glaucoma and other disorders of the eye. Is the patient up to date with their annual eye exam?  Yes  Who is the provider or what is the name of the office in which the patient attends annual eye exams? Unsure of name   Dental Screening: Recommended annual dental exams for proper oral hygiene  Community Resource Referral / Chronic Care Management: CRR required this  visit?  No   CCM required this visit?  No      Plan:     I have personally reviewed and noted the following in the patient's chart:   . Medical and social history . Use of alcohol, tobacco or illicit drugs  . Current  medications and supplements . Functional ability and status . Nutritional status . Physical activity . Advanced directives . List of other physicians . Hospitalizations, surgeries, and ER visits in previous 12 months . Vitals . Screenings to include cognitive, depression, and falls . Referrals and appointments  In addition, I have reviewed and discussed with patient certain preventive protocols, quality metrics, and best practice recommendations. A written personalized care plan for preventive services as well as general preventive health recommendations were provided to patient.   Due to this being a telephonic visit, the after visit summary with patients personalized plan was offered to patient via mail or my-chart. Pper request, patient was mailed a copy of [place], LPN   [id number]  Nurse Health Advisor  Nurse Notes: None

## 2020-05-19 NOTE — Patient Instructions (Signed)
Tara Conner , Thank you for taking time to completed your Medicare Wellness Visit. I appreciate your ongoing commitment to your health goals. Please review the following plan we discussed and let me know if I can assist you in the future.   Screening recommendations/referrals: Colonoscopy: Completed 03/31/2020-Not required after age 73. Mammogram: Completed 01/31/2020-Due- 01/30/2021 Bone Density: Per our conversation, completed this year. No documentation available. Recommended yearly ophthalmology/optometry visit for glaucoma screening and checkup Recommended yearly dental visit for hygiene and checkup  Vaccinations: Influenza vaccine: Declined Pneumococcal vaccine: Declined Tdap vaccine: Discuss with pharmacy Shingles vaccine: Declined   Covid-19:Due for Booster  Advanced directives: Information mailed today  Conditions/risks identified: See problem list  Next appointment: Follow up in one year for your annual wellness visit    Preventive Care 65 Years and Older, Female Preventive care refers to lifestyle choices and visits with your health care provider that can promote health and wellness. What does preventive care include?  A yearly physical exam. This is also called an annual well check.  Dental exams once or twice a year.  Routine eye exams. Ask your health care provider how often you should have your eyes checked.  Personal lifestyle choices, including:  Daily care of your teeth and gums.  Regular physical activity.  Eating a healthy diet.  Avoiding tobacco and drug use.  Limiting alcohol use.  Practicing safe sex.  Taking low-dose aspirin every day.  Taking vitamin and mineral supplements as recommended by your health care provider. What happens during an annual well check? The services and screenings done by your health care provider during your annual well check will depend on your age, overall health, lifestyle risk factors, and family history of  disease. Counseling  Your health care provider may ask you questions about your:  Alcohol use.  Tobacco use.  Drug use.  Emotional well-being.  Home and relationship well-being.  Sexual activity.  Eating habits.  History of falls.  Memory and ability to understand (cognition).  Work and work Astronomer.  Reproductive health. Screening  You may have the following tests or measurements:  Height, weight, and BMI.  Blood pressure.  Lipid and cholesterol levels. These may be checked every 5 years, or more frequently if you are over 44 years old.  Skin check.  Lung cancer screening. You may have this screening every year starting at age 28 if you have a 30-pack-year history of smoking and currently smoke or have quit within the past 15 years.  Fecal occult blood test (FOBT) of the stool. You may have this test every year starting at age 69.  Flexible sigmoidoscopy or colonoscopy. You may have a sigmoidoscopy every 5 years or a colonoscopy every 10 years starting at age 15.  Hepatitis C blood test.  Hepatitis B blood test.  Sexually transmitted disease (STD) testing.  Diabetes screening. This is done by checking your blood sugar (glucose) after you have not eaten for a while (fasting). You may have this done every 1-3 years.  Bone density scan. This is done to screen for osteoporosis. You may have this done starting at age 30.  Mammogram. This may be done every 1-2 years. Talk to your health care provider about how often you should have regular mammograms. Talk with your health care provider about your test results, treatment options, and if necessary, the need for more tests. Vaccines  Your health care provider may recommend certain vaccines, such as:  Influenza vaccine. This is recommended every year.  Tetanus,  diphtheria, and acellular pertussis (Tdap, Td) vaccine. You may need a Td booster every 10 years.  Zoster vaccine. You may need this after age  79.  Pneumococcal 13-valent conjugate (PCV13) vaccine. One dose is recommended after age 73.  Pneumococcal polysaccharide (PPSV23) vaccine. One dose is recommended after age 10. Talk to your health care provider about which screenings and vaccines you need and how often you need them. This information is not intended to replace advice given to you by your health care provider. Make sure you discuss any questions you have with your health care provider. Document Released: 06/12/2015 Document Revised: 02/03/2016 Document Reviewed: 03/17/2015 Elsevier Interactive Patient Education  2017 [place] Prevention in the Home Falls can cause injuries. They can happen to people of all ages. There are many things you can do to make your home safe and to help prevent falls. What can I do on the outside of my home?  Regularly fix the edges of walkways and driveways and fix any cracks.  Remove anything that might make you trip as you walk through a door, such as a raised step or threshold.  Trim any bushes or trees on the path to your home.  Use bright outdoor lighting.  Clear any walking paths of anything that might make someone trip, such as rocks or tools.  Regularly check to see if handrails are loose or broken. Make sure that both sides of any steps have handrails.  Any raised decks and porches should have guardrails on the edges.  Have any leaves, snow, or ice cleared regularly.  Use sand or salt on walking paths during winter.  Clean up any spills in your garage right away. This includes oil or grease spills. What can I do in the bathroom?  Use night lights.  Install grab bars by the toilet and in the tub and shower. Do not use towel bars as grab bars.  Use non-skid mats or decals in the tub or shower.  If you need to sit down in the shower, use a plastic, non-slip stool.  Keep the floor dry. Clean up any water that spills on the floor as soon as it happens.  Remove  soap buildup in the tub or shower regularly.  Attach bath mats securely with double-sided non-slip rug tape.  Do not have throw rugs and other things on the floor that can make you trip. What can I do in the bedroom?  Use night lights.  Make sure that you have a light by your bed that is easy to reach.  Do not use any sheets or blankets that are too big for your bed. They should not hang down onto the floor.  Have a firm chair that has side arms. You can use this for support while you get dressed.  Do not have throw rugs and other things on the floor that can make you trip. What can I do in the kitchen?  Clean up any spills right away.  Avoid walking on wet floors.  Keep items that you use a lot in easy-to-reach places.  If you need to reach something above you, use a strong step stool that has a grab bar.  Keep electrical cords out of the way.  Do not use floor polish or wax that makes floors slippery. If you must use wax, use non-skid floor wax.  Do not have throw rugs and other things on the floor that can make you trip. What can I do with  my stairs?  Do not leave any items on the stairs.  Make sure that there are handrails on both sides of the stairs and use them. Fix handrails that are broken or loose. Make sure that handrails are as long as the stairways.  Check any carpeting to make sure that it is firmly attached to the stairs. Fix any carpet that is loose or worn.  Avoid having throw rugs at the top or bottom of the stairs. If you do have throw rugs, attach them to the floor with carpet tape.  Make sure that you have a light switch at the top of the stairs and the bottom of the stairs. If you do not have them, ask someone to add them for you. What else can I do to help prevent falls?  Wear shoes that:  Do not have high heels.  Have rubber bottoms.  Are comfortable and fit you well.  Are closed at the toe. Do not wear sandals.  If you use a  stepladder:  Make sure that it is fully opened. Do not climb a closed stepladder.  Make sure that both sides of the stepladder are locked into place.  Ask someone to hold it for you, if possible.  Clearly mark and make sure that you can see:  Any grab bars or handrails.  First and last steps.  Where the edge of each step is.  Use tools that help you move around (mobility aids) if they are needed. These include:  Canes.  Walkers.  Scooters.  Crutches.  Turn on the lights when you go into a dark area. Replace any light bulbs as soon as they burn out.  Set up your furniture so you have a clear path. Avoid moving your furniture around.  If any of your floors are uneven, fix them.  If there are any pets around you, be aware of where they are.  Review your medicines with your doctor. Some medicines can make you feel dizzy. This can increase your chance of falling. Ask your doctor what other things that you can do to help prevent falls. This information is not intended to replace advice given to you by your health care provider. Make sure you discuss any questions you have with your health care provider. Document Released: 03/12/2009 Document Revised: 10/22/2015 Document Reviewed: 06/20/2014 Elsevier Interactive Patient Education  2017 [employer].

## 2020-05-19 NOTE — Telephone Encounter (Signed)
During Medicare Wellness visit, patient requested refills of Lantus & Humalog. She is requesting the pens for both because they are easier for her to use.

## 2020-06-02 ENCOUNTER — Other Ambulatory Visit: Payer: Self-pay | Admitting: Family Medicine

## 2020-06-02 ENCOUNTER — Other Ambulatory Visit: Payer: Self-pay | Admitting: Gastroenterology

## 2020-06-02 DIAGNOSIS — Z794 Long term (current) use of insulin: Secondary | ICD-10-CM

## 2020-06-02 DIAGNOSIS — D509 Iron deficiency anemia, unspecified: Secondary | ICD-10-CM

## 2020-06-02 DIAGNOSIS — K5520 Angiodysplasia of colon without hemorrhage: Secondary | ICD-10-CM

## 2020-06-02 DIAGNOSIS — E1140 Type 2 diabetes mellitus with diabetic neuropathy, unspecified: Secondary | ICD-10-CM

## 2020-06-03 ENCOUNTER — Ambulatory Visit: Payer: Medicare Other | Admitting: Gastroenterology

## 2020-06-08 ENCOUNTER — Ambulatory Visit: Payer: Medicare Other | Admitting: Family Medicine

## 2020-06-23 ENCOUNTER — Encounter: Payer: Self-pay | Admitting: Gastroenterology

## 2020-06-23 ENCOUNTER — Ambulatory Visit (INDEPENDENT_AMBULATORY_CARE_PROVIDER_SITE_OTHER): Payer: Medicare Other | Admitting: Gastroenterology

## 2020-06-23 ENCOUNTER — Other Ambulatory Visit: Payer: Self-pay

## 2020-06-23 ENCOUNTER — Other Ambulatory Visit: Payer: Self-pay | Admitting: Family Medicine

## 2020-06-23 ENCOUNTER — Other Ambulatory Visit (INDEPENDENT_AMBULATORY_CARE_PROVIDER_SITE_OTHER): Payer: Medicare Other

## 2020-06-23 VITALS — BP 138/80 | HR 98 | Ht 67.0 in | Wt 211.1 lb

## 2020-06-23 DIAGNOSIS — K219 Gastro-esophageal reflux disease without esophagitis: Secondary | ICD-10-CM | POA: Diagnosis not present

## 2020-06-23 DIAGNOSIS — K5520 Angiodysplasia of colon without hemorrhage: Secondary | ICD-10-CM

## 2020-06-23 DIAGNOSIS — B9681 Helicobacter pylori [H. pylori] as the cause of diseases classified elsewhere: Secondary | ICD-10-CM

## 2020-06-23 DIAGNOSIS — D509 Iron deficiency anemia, unspecified: Secondary | ICD-10-CM

## 2020-06-23 DIAGNOSIS — K2970 Gastritis, unspecified, without bleeding: Secondary | ICD-10-CM | POA: Diagnosis not present

## 2020-06-23 DIAGNOSIS — Z8673 Personal history of transient ischemic attack (TIA), and cerebral infarction without residual deficits: Secondary | ICD-10-CM

## 2020-06-23 DIAGNOSIS — Z7902 Long term (current) use of antithrombotics/antiplatelets: Secondary | ICD-10-CM

## 2020-06-23 NOTE — Patient Instructions (Signed)
If you are age 74 or older, your body mass index should be between 23-30. Your Body mass index is 33.07 kg/m. If this is out of the aforementioned range listed, please consider follow up with your Primary Care Provider.  If you are age 12 or younger, your body mass index should be between 19-25. Your Body mass index is 33.07 kg/m. If this is out of the aformentioned range listed, please consider follow up with your Primary Care Provider.   Please go to the lab on the 2nd floor [address] before you leave the office today.   It was a pleasure to see you today!  Vito Cirigliano, D.O.

## 2020-06-23 NOTE — Progress Notes (Signed)
P  Chief Complaint:    Iron deficiency anemia, procedure follow-up, H. pylori gastritis  GI History: 75 year old female with a history of hypertension, hyperlipidemia, CVA (on Plavix), DM II who follows in the GI clinic for Iron Deficiency Anemia.  Please see notes from 11/02/2018 when she established care for prior history regarding IDA.  Underwent EGD/colonoscopy approximately 2018 in [place]; not sure why the procedures were done at that time and not sure of the results.  Previous good response to p.o. iron on repeat labs in 01/2020.  Endoscopic History: -EGD (03/31/2020, Dr. Bryan Lemma): Nonobstructing Schatzki's ring, normal stomach (path: H. pylori), superficial ulcer in the pyloric channel, 1 AVM in duodenal bulb 1 D2, otherwise normal duodenum (path normal) -Colonoscopy (03/31/2020, Dr. Bryan Lemma): Excessive looping in the right colon, otherwise normal mucosa.  Grade 2 hemorrhoids.  Repeat 10 years   HPI:     Patient is a 75 y.o. female presenting to the Gastroenterology Clinic for follow-up.  Last seen by Carl Best on 02/07/2020 for follow-up of IDA.  Since then, completed EGD/colonoscopy 03/2020 as outlined above.  EGD with superficial ulcer in pyloric channel along with 2 small AVMs in the proximal duodenum.  Gastric biopsies with H. pylori gastritis.  Duodenal biopsies were otherwise normal.  Was treated with quadruple therapy along with high-dose PPI x6-8 weeks.  Colonoscopy with grade 2 hemorrhoids, otherwise normal.  States she is otherwise doing well today.  No complaints.  Completed H. pylori therapy without issue.  Still taking oral iron without issue. Still taking Protonix 40 mg PO BID and cimetidine nightly (prescribed for heartburn prior to the PPI).   Review of systems:     No chest pain, no SOB, no fevers, no urinary sx   Past Medical History:  Diagnosis Date  . Diabetes mellitus without complication ([place])   . Diabetic retinopathy ([place])    PDR OU  .  Hypertension   . Hypertensive retinopathy    OU  . Stroke [facility]) 2019    Patient's surgical history, family medical history, social history, medications and allergies were all reviewed in Epic    Current Outpatient Medications  Medication Sig Dispense Refill  . amLODipine (NORVASC) 5 MG tablet TAKE 1 TABLET(5 MG) BY MOUTH DAILY 90 tablet 0  . Ascorbic Acid (VITAMIN C PO) Take 1 tablet by mouth as needed.    Marland Kitchen atorvastatin (LIPITOR) 40 MG tablet TAKE 1 TABLET(40 MG) BY MOUTH DAILY 90 tablet 2  . atorvastatin (LIPITOR) 80 MG tablet Take 1 tablet (80 mg total) by mouth daily. 90 tablet 3  . Blood Glucose Monitoring Suppl (ACCU-CHEK AVIVA PLUS) w/Device KIT USE TO TEST BLOOD SUGAR TWICE DAILY 1 kit 1  . cimetidine (TAGAMET) 300 MG tablet TAKE 1 TABLET(300 MG) BY MOUTH AT BEDTIME 90 tablet 4  . clopidogrel (PLAVIX) 75 MG tablet TAKE 1 TABLET(75 MG) BY MOUTH DAILY 90 tablet 1  . ferrous sulfate 325 (65 FE) MG tablet Take 1 tablet (325 mg total) by mouth daily with breakfast. 90 tablet 1  . gabapentin (NEURONTIN) 300 MG capsule TAKE 1 CAPSULE BY MOUTH AT NIGHT FOR 1 WEEK. INCREASE TO 1 CAPSULE 2 TIMES DAILY AS TOLERATED 60 capsule 3  . glucose blood test strip UAD qid to monitor blood glucose/Dx: E11.40 200 each 12  . Glycerin-Polysorbate 80 (REFRESH DRY EYE THERAPY OP) Place 1-2 drops into both eyes as needed (for dryness).     . insulin glargine (LANTUS) 100 UNIT/ML Solostar Pen Inject 40 Units  into the skin daily. 15 mL 3  . insulin lispro (HUMALOG) 100 UNIT/ML KwikPen Inject 0-15 Units into the skin 3 (three) times daily. 15 mL 3  . Insulin Pen Needle (ULTRA THIN PEN NEEDLES) 32G X 4 MM MISC To use with Lantus 100 each 1  . Insulin Syringe-Needle U-100 (INSULIN SYRINGE 1CC/31GX5/16") 31G X 5/16" 1 ML MISC USE AS DIRECTED WITH LANTUS 100 each 5  . Lancets (ACCU-CHEK SOFT TOUCH) lancets Use 4 times daily. Patient now using prandial insulin. 100 each PRN  . Multiple Vitamins-Minerals  (MULTIVITAMIN ADULT PO) Take 1 tablet by mouth daily.    Marland Kitchen NEEDLE, DISP, 30 G (B-D DISP NEEDLE 30GX1") 30G X 1" MISC To use with Lantus. 100 each 3  . pantoprazole (PROTONIX) 20 MG tablet TAKE 1 TABLET BY MOUTH TWICE DAILY HALF AN HOURS OR 1 HOURS BEFORE EATING 180 tablet 3  . VITAMIN D PO Take 1 tablet by mouth daily.     No current facility-administered medications for this visit.    Physical Exam:    GENERAL:  Pleasant female in NAD PSYCH: : Cooperative, normal affect EENT:  conjunctiva pink, mucous membranes moist, neck supple without masses CARDIAC:  RRR, no murmur, no peripheral edema PULM: Normal respiratory effort, lungs CTA bilaterally, no wheezing ABDOMEN:  Nondistended, soft, nontender. No obvious masses, no hepatomegaly,  normal bowel sounds SKIN:  turgor, no lesions seen Musculoskeletal:  Normal muscle tone, normal strength NEURO: Alert and oriented x 3, no focal neurologic deficits   IMPRESSION and PLAN:    1) H. pylori gastritis 2) Iron deficiency anemia 3) Small bowel AVMs  -Repeat CBC and iron panel -H. pylori stool testing (will need to be off PPI x2 weeks).  If ongoing iron deficiency anemia despite H. pylori eradication, plan for video capsule endoscopy with possible enteroscopy at [facility] for small bowel AVMs -Stop Protonix for planned H pylori stool Ag test - Continue PO iron   4) Chronic antiplatelet therapy 5) History of CVA -If planning small bowel enteroscopy for diagnostic/therapeutic intent as above, will again need to hold Plavix x5 days  6) GERD: - Resume cimetidine- well controlled on monotherapy prior to H pylori therapy as above - Stopping PPI as above - Resume antireflux lifestyle/dietary mods          Stephan Nelis V Caroleen Stoermer ,DO, FACG 06/23/2020, 1:18 PM

## 2020-06-24 LAB — IRON AND TIBC
Iron Saturation: 29 % (ref 15–55)
Iron: 79 ug/dL (ref 27–139)
Total Iron Binding Capacity: 274 ug/dL (ref 250–450)
UIBC: 195 ug/dL (ref 118–369)

## 2020-06-24 LAB — CBC
HCT: 31.5 % — ABNORMAL LOW (ref 36.0–46.0)
Hemoglobin: 10.5 g/dL — ABNORMAL LOW (ref 12.0–15.0)
MCHC: 33.3 g/dL (ref 30.0–36.0)
MCV: 84.8 fl (ref 78.0–100.0)
Platelets: 464 10*3/uL — ABNORMAL HIGH (ref 150.0–400.0)
RBC: 3.72 Mil/uL — ABNORMAL LOW (ref 3.87–5.11)
RDW: 13.6 % (ref 11.5–15.5)
WBC: 5.8 10*3/uL (ref 4.0–10.5)

## 2020-06-24 LAB — FERRITIN: Ferritin: 48 ng/mL (ref 10.0–291.0)

## 2020-07-01 ENCOUNTER — Other Ambulatory Visit: Payer: Medicare Other

## 2020-07-01 ENCOUNTER — Other Ambulatory Visit: Payer: Self-pay

## 2020-07-01 ENCOUNTER — Telehealth: Payer: Self-pay | Admitting: General Surgery

## 2020-07-01 DIAGNOSIS — K2970 Gastritis, unspecified, without bleeding: Secondary | ICD-10-CM

## 2020-07-01 DIAGNOSIS — D509 Iron deficiency anemia, unspecified: Secondary | ICD-10-CM

## 2020-07-01 DIAGNOSIS — K5520 Angiodysplasia of colon without hemorrhage: Secondary | ICD-10-CM

## 2020-07-01 DIAGNOSIS — B9681 Helicobacter pylori [H. pylori] as the cause of diseases classified elsewhere: Secondary | ICD-10-CM

## 2020-07-01 NOTE — Telephone Encounter (Signed)
Left a detailed message on the patients voicemail regarding her iron studies and H/H, pt informed to continue PO iron. Told her we will need to have labs drawn again in 3 months, patient told to call with and further questions. Orders put in for labs in 3 months.

## 2020-07-01 NOTE — Telephone Encounter (Signed)
-----   Message from [facility] V, DO sent at 07/01/2020 10:53 AM EST ----- H/H at baseline and iron panel looks good. Continue PO iron for now, with repeat iron panel and CBC in 3-4 months. If stable, will plan on stopping PO iron and observe for stability off iron. Thanks ----- Message ----- From: Lynann Bologna, MD Sent: 06/27/2020  10:48 AM EST To: Shellia Cleverly, DO  Dr C's pt Labs at baseline. Will forward to him. RG

## 2020-07-03 LAB — H. PYLORI ANTIGEN, STOOL: H pylori Ag, Stl: NEGATIVE

## 2020-07-09 ENCOUNTER — Other Ambulatory Visit: Payer: Self-pay

## 2020-07-10 ENCOUNTER — Ambulatory Visit (INDEPENDENT_AMBULATORY_CARE_PROVIDER_SITE_OTHER): Payer: Medicare Other | Admitting: Nurse Practitioner

## 2020-07-10 ENCOUNTER — Encounter: Payer: Self-pay | Admitting: Nurse Practitioner

## 2020-07-10 VITALS — BP 126/64 | HR 90 | Temp 97.5°F | Ht 67.0 in | Wt 212.4 lb

## 2020-07-10 DIAGNOSIS — E7800 Pure hypercholesterolemia, unspecified: Secondary | ICD-10-CM | POA: Diagnosis not present

## 2020-07-10 DIAGNOSIS — Z8673 Personal history of transient ischemic attack (TIA), and cerebral infarction without residual deficits: Secondary | ICD-10-CM | POA: Diagnosis not present

## 2020-07-10 DIAGNOSIS — E1140 Type 2 diabetes mellitus with diabetic neuropathy, unspecified: Secondary | ICD-10-CM

## 2020-07-10 DIAGNOSIS — N1832 Chronic kidney disease, stage 3b: Secondary | ICD-10-CM

## 2020-07-10 DIAGNOSIS — I10 Essential (primary) hypertension: Secondary | ICD-10-CM | POA: Diagnosis not present

## 2020-07-10 DIAGNOSIS — Z794 Long term (current) use of insulin: Secondary | ICD-10-CM | POA: Diagnosis not present

## 2020-07-10 HISTORY — DX: Chronic kidney disease, stage 3b: N18.32

## 2020-07-10 LAB — MICROALBUMIN / CREATININE URINE RATIO
Creatinine,U: 145.8 mg/dL
Microalb Creat Ratio: 3.2 mg/g (ref 0.0–30.0)
Microalb, Ur: 4.7 mg/dL — ABNORMAL HIGH (ref 0.0–1.9)

## 2020-07-10 LAB — LIPID PANEL
Cholesterol: 129 mg/dL (ref 0–200)
HDL: 36.6 mg/dL — ABNORMAL LOW (ref >39.00)
LDL Cholesterol: 59 mg/dL (ref 0–99)
NonHDL: 92.02
Total CHOL/HDL Ratio: 4
Triglycerides: 166 mg/dL — ABNORMAL HIGH (ref 0.0–149.0)
VLDL: 33.2 mg/dL (ref 0.0–40.0)

## 2020-07-10 LAB — BASIC METABOLIC PANEL
BUN: 16 mg/dL (ref 6–23)
CO2: 28 mEq/L (ref 19–32)
Calcium: 9.6 mg/dL (ref 8.4–10.5)
Chloride: 101 mEq/L (ref 96–112)
Creatinine, Ser: 1.2 mg/dL (ref 0.40–1.20)
GFR: 44.92 mL/min — ABNORMAL LOW (ref >60.00)
Glucose, Bld: 124 mg/dL — ABNORMAL HIGH (ref 70–99)
Potassium: 4.3 mEq/L (ref 3.5–5.1)
Sodium: 138 mEq/L (ref 135–145)

## 2020-07-10 LAB — HEPATIC FUNCTION PANEL
ALT: 12 U/L (ref 0–35)
AST: 16 U/L (ref 0–37)
Albumin: 3.9 g/dL (ref 3.5–5.2)
Alkaline Phosphatase: 93 U/L (ref 39–117)
Bilirubin, Direct: 0.1 mg/dL (ref 0.0–0.3)
Total Bilirubin: 0.4 mg/dL (ref 0.2–1.2)
Total Protein: 7.7 g/dL (ref 6.0–8.3)

## 2020-07-10 LAB — HEMOGLOBIN A1C: Hgb A1c MFr Bld: 8.8 % — ABNORMAL HIGH (ref 4.6–6.5)

## 2020-07-10 MED ORDER — CLOPIDOGREL BISULFATE 75 MG PO TABS
75.0000 mg | ORAL_TABLET | Freq: Every day | ORAL | 1 refills | Status: DC
Start: 2020-07-10 — End: 2021-05-19

## 2020-07-10 MED ORDER — AMLODIPINE BESYLATE 5 MG PO TABS: 5.0000 mg | ORAL_TABLET | Freq: Every day | ORAL | 3 refills | Status: DC

## 2020-07-10 NOTE — Patient Instructions (Signed)
Go to lab for blood draw and urine collection  Bring glucometer of glucose readings to next office visit.

## 2020-07-10 NOTE — Progress Notes (Signed)
Subjective:  Patient ID: Tara Conner, female    DOB: 10/04/1945  Age: 75 y.o. MRN: [id number]  CC: Follow-up (6 month f/u )  HPI  Essential hypertension BP at goal BP Readings from Last 3 Encounters:  07/10/20 126/64  06/23/20 138/80  03/31/20 139/80   Repeat BMP Continue amlodipine  Type 2 diabetes mellitus with diabetic neuropathy, with long-term current use of insulin ([place]) Unable to provide any home glucose readings Denies any glucose <90 or >200  Repeat BMP, HgbA1c, lipid panel, and urine microalbumin Controlled with HgbA1c at 8.8 Stable renal function at stage 3b Maintain lantus and humalog dose F/up in 35monthwith glucose reading  Reviewed past Medical, Social and Family history today.  Outpatient Medications Prior to Visit  Medication Sig Dispense Refill  . Ascorbic Acid (VITAMIN C PO) Take 1 tablet by mouth as needed.    .Marland Kitchenatorvastatin (LIPITOR) 40 MG tablet TAKE 1 TABLET(40 MG) BY MOUTH DAILY 90 tablet 2  . atorvastatin (LIPITOR) 80 MG tablet Take 1 tablet (80 mg total) by mouth daily. 90 tablet 3  . BD PEN NEEDLE NANO 2ND GEN 32G X 4 MM MISC USE WITH LANTUS DAILY 100 each 1  . Blood Glucose Monitoring Suppl (ACCU-CHEK AVIVA PLUS) w/Device KIT USE TO TEST BLOOD SUGAR TWICE DAILY 1 kit 1  . cimetidine (TAGAMET) 300 MG tablet TAKE 1 TABLET(300 MG) BY MOUTH AT BEDTIME 90 tablet 4  . ferrous sulfate 325 (65 FE) MG tablet Take 1 tablet (325 mg total) by mouth daily with breakfast. 90 tablet 1  . gabapentin (NEURONTIN) 300 MG capsule TAKE 1 CAPSULE BY MOUTH AT NIGHT FOR 1 WEEK. INCREASE TO 1 CAPSULE 2 TIMES DAILY AS TOLERATED 60 capsule 3  . glucose blood test strip UAD qid to monitor blood glucose/Dx: E11.40 200 each 12  . Glycerin-Polysorbate 80 (REFRESH DRY EYE THERAPY OP) Place 1-2 drops into both eyes as needed (for dryness).     . insulin glargine (LANTUS) 100 UNIT/ML Solostar Pen Inject 40 Units into the skin daily. 15 mL 3  . insulin lispro (HUMALOG) 100  UNIT/ML KwikPen Inject 0-15 Units into the skin 3 (three) times daily. 15 mL 3  . Insulin Syringe-Needle U-100 (INSULIN SYRINGE 1CC/31GX5/16") 31G X 5/16" 1 ML MISC USE AS DIRECTED WITH LANTUS 100 each 5  . Lancets (ACCU-CHEK SOFT TOUCH) lancets Use 4 times daily. Patient now using prandial insulin. 100 each PRN  . Multiple Vitamins-Minerals (MULTIVITAMIN ADULT PO) Take 1 tablet by mouth daily.    .Marland KitchenNEEDLE, DISP, 30 G (B-D DISP NEEDLE 30GX1") 30G X 1" MISC To use with Lantus. 100 each 3  . pantoprazole (PROTONIX) 20 MG tablet TAKE 1 TABLET BY MOUTH TWICE DAILY HALF AN HOURS OR 1 HOURS BEFORE EATING 180 tablet 3  . VITAMIN D PO Take 1 tablet by mouth daily.    .Marland KitchenamLODipine (NORVASC) 5 MG tablet TAKE 1 TABLET(5 MG) BY MOUTH DAILY 90 tablet 0  . clopidogrel (PLAVIX) 75 MG tablet TAKE 1 TABLET(75 MG) BY MOUTH DAILY 90 tablet 1   No facility-administered medications prior to visit.    ROS See HPI  Objective:  BP 126/64 (BP Location: Left Arm, Patient Position: Sitting, Cuff Size: Normal)   Pulse 90   Temp (!) 97.5 F (36.4 C) (Temporal)   Ht 5' 7" (1.702 m)   Wt 212 lb 6.4 oz (96.3 kg)   LMP  (LMP Unknown)   SpO2 98%   BMI 33.27 kg/m  Physical Exam Cardiovascular:     Rate and Rhythm: Normal rate and regular rhythm.     Pulses: Normal pulses.     Heart sounds: Normal heart sounds.  Pulmonary:     Effort: Pulmonary effort is normal.     Breath sounds: Normal breath sounds.  Musculoskeletal:     Right lower leg: No edema.     Left lower leg: No edema.  Neurological:     Mental Status: She is alert and oriented to person, place, and time.  Psychiatric:        Mood and Affect: Mood normal.        Behavior: Behavior normal.        Thought Content: Thought content normal.     Assessment & Plan:  This visit occurred during the SARS-CoV-2 public health emergency.  Safety protocols were in place, including screening questions prior to the visit, additional usage of staff PPE, and  extensive cleaning of exam room while observing appropriate contact time as indicated for disinfecting solutions.   Tara Conner was seen today for follow-up.  Diagnoses and all orders for this visit:  Type 2 diabetes mellitus with diabetic neuropathy, with long-term current use of insulin (HCC) -     Basic metabolic panel -     Hemoglobin A1c -     Microalbumin / creatinine urine ratio  Elevated LDL cholesterol level -     Hepatic function panel -     Lipid panel  Essential hypertension -     Basic metabolic panel -     amLODipine (NORVASC) 5 MG tablet; Take 1 tablet (5 mg total) by mouth daily.  History of CVA (cerebrovascular accident) -     clopidogrel (PLAVIX) 75 MG tablet; Take 1 tablet (75 mg total) by mouth daily.  Stage 3b chronic kidney disease ([place])    Problem List Items Addressed This Visit      Cardiovascular and Mediastinum   Essential hypertension    BP at goal BP Readings from Last 3 Encounters:  07/10/20 126/64  06/23/20 138/80  03/31/20 139/80   Repeat BMP Continue amlodipine      Relevant Medications   amLODipine (NORVASC) 5 MG tablet   Other Relevant Orders   Basic metabolic panel (Completed)     Endocrine   Type 2 diabetes mellitus with diabetic neuropathy, with long-term current use of insulin ([place]) - Primary    Unable to provide any home glucose readings Denies any glucose <90 or >200  Repeat BMP, HgbA1c, lipid panel, and urine microalbumin Controlled with HgbA1c at 8.8 Stable renal function at stage 3b Maintain lantus and humalog dose F/up in 99monthwith glucose reading      Relevant Orders   Basic metabolic panel (Completed)   Hemoglobin A1c (Completed)   Microalbumin / creatinine urine ratio     Genitourinary   Stage 3b chronic kidney disease (HCC)     Other   Elevated LDL cholesterol level   Relevant Orders   Hepatic function panel (Completed)   Lipid panel (Completed)   History of CVA (cerebrovascular accident)   Relevant  Medications   clopidogrel (PLAVIX) 75 MG tablet      Follow-up: Return in about 3 months (around 10/07/2020) for DM and HTN, hyperlipidemia (with Dr. KEthelene Hal.  CWilfred Lacy NP

## 2020-07-10 NOTE — Assessment & Plan Note (Addendum)
Unable to provide any home glucose readings Denies any glucose <90 or >200  Repeat BMP, HgbA1c, lipid panel, and urine microalbumin Controlled with HgbA1c at 8.8 Stable renal function at stage 3b Maintain lantus and humalog dose F/up in 34month with glucose reading

## 2020-07-10 NOTE — Assessment & Plan Note (Signed)
BP at goal BP Readings from Last 3 Encounters:  07/10/20 126/64  06/23/20 138/80  03/31/20 139/80   Repeat BMP Continue amlodipine

## 2020-07-20 ENCOUNTER — Ambulatory Visit: Payer: Medicare Other | Admitting: Family Medicine

## 2020-07-27 DIAGNOSIS — Z1152 Encounter for screening for COVID-19: Secondary | ICD-10-CM | POA: Diagnosis not present

## 2020-09-18 ENCOUNTER — Other Ambulatory Visit: Payer: Self-pay | Admitting: Family Medicine

## 2020-09-18 ENCOUNTER — Other Ambulatory Visit: Payer: Self-pay

## 2020-09-18 ENCOUNTER — Other Ambulatory Visit: Payer: Self-pay | Admitting: Nurse Practitioner

## 2020-09-18 DIAGNOSIS — K219 Gastro-esophageal reflux disease without esophagitis: Secondary | ICD-10-CM

## 2020-09-18 DIAGNOSIS — I10 Essential (primary) hypertension: Secondary | ICD-10-CM

## 2020-09-18 MED ORDER — INSULIN LISPRO (1 UNIT DIAL) 100 UNIT/ML (KWIKPEN): 0.0000 [IU] | PEN_INJECTOR | Freq: Three times a day (TID) | SUBCUTANEOUS | 1 refills | Status: DC

## 2020-09-18 MED ORDER — INSULIN GLARGINE 100 UNIT/ML SOLOSTAR PEN: 40.0000 [IU] | PEN_INJECTOR | Freq: Every day | SUBCUTANEOUS | 1 refills | Status: DC

## 2020-09-21 NOTE — Telephone Encounter (Signed)
Last OV 07/10/20 w/Nche Last fill 07/10/20  #90/3 Patient should still have refills left.

## 2020-10-05 DIAGNOSIS — Y92019 Unspecified place in single-family (private) house as the place of occurrence of the external cause: Secondary | ICD-10-CM | POA: Diagnosis not present

## 2020-10-05 DIAGNOSIS — S4991XA Unspecified injury of right shoulder and upper arm, initial encounter: Secondary | ICD-10-CM | POA: Diagnosis not present

## 2020-10-05 DIAGNOSIS — R531 Weakness: Secondary | ICD-10-CM | POA: Diagnosis not present

## 2020-10-05 DIAGNOSIS — Y92009 Unspecified place in unspecified non-institutional (private) residence as the place of occurrence of the external cause: Secondary | ICD-10-CM | POA: Diagnosis not present

## 2020-10-05 DIAGNOSIS — X500XXA Overexertion from strenuous movement or load, initial encounter: Secondary | ICD-10-CM | POA: Diagnosis not present

## 2020-10-05 DIAGNOSIS — M25511 Pain in right shoulder: Secondary | ICD-10-CM | POA: Diagnosis not present

## 2020-10-05 DIAGNOSIS — Y9389 Activity, other specified: Secondary | ICD-10-CM | POA: Diagnosis not present

## 2020-10-05 DIAGNOSIS — Y998 Other external cause status: Secondary | ICD-10-CM | POA: Diagnosis not present

## 2020-10-05 DIAGNOSIS — S46811A Strain of other muscles, fascia and tendons at shoulder and upper arm level, right arm, initial encounter: Secondary | ICD-10-CM | POA: Diagnosis not present

## 2020-10-12 ENCOUNTER — Telehealth: Payer: Self-pay | Admitting: Family Medicine

## 2020-10-12 NOTE — Telephone Encounter (Signed)
What is the name of the medication? insulin glargine (LANTUS) 100 UNIT/ML Solostar Pen [[id number]]   Have you contacted your pharmacy to request a refill? Pt is stating she is needing a letter from her doctor to her insurance company([employer]) stating she needs this medication. They have put a 10/27/20 deadline on this, that's what they told her.   Which pharmacy would you like this sent to? Pharmacy  [id number] - [place], [place] - [address] AT [facility] [address]  [address], [place] [address]  Phone:  [phone] Fax:  [phone]  DEA #:  [id number]      Patient notified that their request is being sent to the clinical staff for review and that they should receive a call once it is complete. If they do not receive a call within 72 hours they can check with their pharmacy or our office.

## 2020-10-13 ENCOUNTER — Other Ambulatory Visit: Payer: Self-pay

## 2020-10-21 ENCOUNTER — Other Ambulatory Visit: Payer: Self-pay

## 2020-10-21 MED ORDER — INSULIN GLARGINE 100 UNIT/ML SOLOSTAR PEN: 40.0000 [IU] | PEN_INJECTOR | Freq: Every day | SUBCUTANEOUS | 1 refills | Status: DC

## 2020-10-21 NOTE — Telephone Encounter (Signed)
done

## 2020-11-10 ENCOUNTER — Telehealth: Payer: Self-pay | Admitting: Family Medicine

## 2020-11-10 NOTE — Telephone Encounter (Signed)
Pt is in [place] on vacation. She is needing her  insulin glargine (LANTUS) 100 UNIT/ML Solostar Pen [[id number]] sent to Address: [address] [place], [place] [address] Departments: [facility]. Please advise pt at [phone].

## 2020-11-11 ENCOUNTER — Other Ambulatory Visit: Payer: Self-pay

## 2020-11-11 ENCOUNTER — Other Ambulatory Visit: Payer: Self-pay | Admitting: Family Medicine

## 2020-11-11 MED ORDER — INSULIN GLARGINE 100 UNIT/ML SOLOSTAR PEN: 40.0000 [IU] | PEN_INJECTOR | Freq: Every day | SUBCUTANEOUS | 1 refills | Status: DC

## 2020-11-11 NOTE — Telephone Encounter (Signed)
Refill sent in patient aware and agrees to give [place] a call when she returns to schedule a follow up visit.

## 2020-11-16 ENCOUNTER — Telehealth: Payer: Self-pay

## 2020-11-16 NOTE — Telephone Encounter (Signed)
Pt called to inform us that her insurance did not cover the Lantus.  Pt said that the [facility] in  [place] said that they would get int ouch with this office to let provider know the names of the medication that is covered by insurance.  I informed pt that if she could get the names of the medication herself and call office back, that I could send the names to nurse directly.  Pt said that she would call back after she finds out.

## 2020-12-10 ENCOUNTER — Telehealth: Payer: Self-pay | Admitting: Family Medicine

## 2020-12-10 NOTE — Telephone Encounter (Signed)
Pt called and about her insulin,She wants to know if she can get on Levemir in place Humalog. Call back [phone]

## 2020-12-16 ENCOUNTER — Telehealth: Payer: Self-pay | Admitting: Family Medicine

## 2020-12-16 DIAGNOSIS — E1140 Type 2 diabetes mellitus with diabetic neuropathy, unspecified: Secondary | ICD-10-CM

## 2020-12-16 NOTE — Telephone Encounter (Signed)
Patient calling states that her insurance will no longer cover Humalog patient would like to know if she could have a prescription for Levimer? Please advise.

## 2020-12-16 NOTE — Telephone Encounter (Signed)
Pt calling and needs Humalog, accu check test strips and levimer(Because lantus isnt covered by her insurance) sent to [facility] [address] [employer]. She would like a 3 month supply if possible

## 2020-12-16 NOTE — Telephone Encounter (Signed)
Duplicate

## 2020-12-17 MED ORDER — INSULIN ASPART 100 UNIT/ML IJ SOLN: INTRAMUSCULAR | 99 refills | Status: DC

## 2020-12-18 NOTE — Telephone Encounter (Signed)
Called patient to inform that Rx was sent in and to schedule follow up visit. No answer LMTCB and schedule appointment

## 2020-12-23 ENCOUNTER — Other Ambulatory Visit: Payer: Self-pay | Admitting: Family Medicine

## 2020-12-29 ENCOUNTER — Ambulatory Visit: Payer: Medicare Other | Admitting: Family Medicine

## 2021-01-19 ENCOUNTER — Ambulatory Visit (INDEPENDENT_AMBULATORY_CARE_PROVIDER_SITE_OTHER): Payer: Medicare Other | Admitting: Family Medicine

## 2021-01-19 ENCOUNTER — Other Ambulatory Visit: Payer: Self-pay

## 2021-01-19 ENCOUNTER — Encounter: Payer: Self-pay | Admitting: Family Medicine

## 2021-01-19 VITALS — BP 110/68 | HR 99 | Temp 96.5°F | Ht 67.0 in | Wt 213.8 lb

## 2021-01-19 DIAGNOSIS — I10 Essential (primary) hypertension: Secondary | ICD-10-CM

## 2021-01-19 DIAGNOSIS — D509 Iron deficiency anemia, unspecified: Secondary | ICD-10-CM

## 2021-01-19 DIAGNOSIS — E1140 Type 2 diabetes mellitus with diabetic neuropathy, unspecified: Secondary | ICD-10-CM

## 2021-01-19 DIAGNOSIS — Z794 Long term (current) use of insulin: Secondary | ICD-10-CM

## 2021-01-19 DIAGNOSIS — N1832 Chronic kidney disease, stage 3b: Secondary | ICD-10-CM

## 2021-01-19 LAB — BASIC METABOLIC PANEL
BUN: 25 mg/dL — ABNORMAL HIGH (ref 6–23)
CO2: 23 mEq/L (ref 19–32)
Calcium: 9.3 mg/dL (ref 8.4–10.5)
Chloride: 101 mEq/L (ref 96–112)
Creatinine, Ser: 1.23 mg/dL — ABNORMAL HIGH (ref 0.40–1.20)
GFR: 43.44 mL/min — ABNORMAL LOW (ref >60.00)
Glucose, Bld: 306 mg/dL — ABNORMAL HIGH (ref 70–99)
Potassium: 3.9 mEq/L (ref 3.5–5.1)
Sodium: 134 mEq/L — ABNORMAL LOW (ref 135–145)

## 2021-01-19 LAB — CBC
HCT: 30.6 % — ABNORMAL LOW (ref 36.0–46.0)
Hemoglobin: 10.5 g/dL — ABNORMAL LOW (ref 12.0–15.0)
MCHC: 34.3 g/dL (ref 30.0–36.0)
MCV: 84.8 fl (ref 78.0–100.0)
Platelets: 392 10*3/uL (ref 150.0–400.0)
RBC: 3.62 Mil/uL — ABNORMAL LOW (ref 3.87–5.11)
RDW: 13.2 % (ref 11.5–15.5)
WBC: 6.3 10*3/uL (ref 4.0–10.5)

## 2021-01-19 LAB — HEMOGLOBIN A1C: Hgb A1c MFr Bld: 11 % — ABNORMAL HIGH (ref 4.6–6.5)

## 2021-01-19 MED ORDER — INSULIN GLARGINE 100 UNIT/ML SOLOSTAR PEN: 40.0000 [IU] | PEN_INJECTOR | Freq: Every day | SUBCUTANEOUS | 1 refills | Status: DC

## 2021-01-19 NOTE — Progress Notes (Signed)
Established Patient Office Visit  Subjective:  Patient ID: Tara Conner, female    DOB: [birth year]  Age: 75 y.o. MRN: [id number]  CC:  Chief Complaint  Patient presents with   Follow-up    Refill/follow up on BP and DM, no concerns. Patient fasting for labs.     HPI Tara Conner presents for follow-up of diabetes, hypertension, iron deficiency anemia.  She is fasting this morning.  She has been out in [place] visiting family.  She is unaccompanied today.  When asked about her insulin schedule, it was not clear as to exactly what insulin she was taking and how much.  She went back and forth between Lantus and NovoLog.  It sounds as though the insurance that she had out in [place] would not cover Lantus and she was taking NovoLog on a sliding scale.  Now that she is back home and switched insurances they cover Lantus.  Patient told me that she left her bag of medicines out in front of the office because she felt that they would be safe there.  Past Medical History:  Diagnosis Date   Diabetes mellitus without complication ([place])    Diabetic retinopathy ([place])    PDR OU   Hypertension    Hypertensive retinopathy    OU   Stroke [facility]) 2019    Past Surgical History:  Procedure Laterality Date   CATARACT EXTRACTION Bilateral    COLONOSCOPY     [facility] possibly but not sure. Said it was negative   ESOPHAGOGASTRODUODENOSCOPY     around the same time said it was negative    EYE SURGERY Bilateral    Cat Sx OU   GALLBLADDER SURGERY      Family History  Problem Relation Age of Onset   Diabetes Mother    Diabetes Sister    Diabetes Brother    Colon cancer Neg Hx    Esophageal cancer Neg Hx    Rectal cancer Neg Hx    Stomach cancer Neg Hx     Social History   Socioeconomic History   Marital status: Single    Spouse name: Not on file   Number of children: Not on file   Years of education: Not on file   Highest education level: Not on file  Occupational  History   Not on file  Tobacco Use   Smoking status: Never   Smokeless tobacco: Never  Vaping Use   Vaping Use: Never used  Substance and Sexual Activity   Alcohol use: Never   Drug use: Never   Sexual activity: Not Currently  Other Topics Concern   Not on file  Social History Narrative   Not on file   Social Determinants of Health   Financial Resource Strain: Low Risk    Difficulty of Paying Living Expenses: Not hard at all  Food Insecurity: No Food Insecurity   Worried About Charity fundraiser in the Last Year: Never true   Ran Out of Food in the Last Year: Never true  Transportation Needs: No Transportation Needs   Lack of Transportation (Medical): No   Lack of Transportation (Non-Medical): No  Physical Activity: Inactive   Days of Exercise per Week: 0 days   Minutes of Exercise per Session: 0 min  Stress: No Stress Concern Present   Feeling of Stress : Not at all  Social Connections: Moderately Isolated   Frequency of Communication with Friends and Family: More than three times a week   Frequency of  Social Gatherings with Friends and Family: More than three times a week   Attends Religious Services: 1 to 4 times per year   Active Member of [employer] or Organizations: No   Attends Music therapist: Never   Marital Status: Divorced  Human resources officer Violence: Not At Risk   Fear of Current or Ex-Partner: No   Emotionally Abused: No   Physically Abused: No   Sexually Abused: No    Outpatient Medications Prior to Visit  Medication Sig Dispense Refill   amLODipine (NORVASC) 5 MG tablet Take 1 tablet (5 mg total) by mouth daily. 90 tablet 3   atorvastatin (LIPITOR) 40 MG tablet TAKE 1 TABLET(40 MG) BY MOUTH DAILY 90 tablet 2   BD PEN NEEDLE NANO 2ND GEN 32G X 4 MM MISC USE WITH LANTUS DAILY 100 each 1   Blood Glucose Monitoring Suppl (ACCU-CHEK AVIVA PLUS) w/Device KIT USE TO TEST BLOOD SUGAR TWICE DAILY 1 kit 1   cimetidine (TAGAMET) 300 MG tablet TAKE 1  TABLET(300 MG) BY MOUTH AT BEDTIME 90 tablet 4   clopidogrel (PLAVIX) 75 MG tablet Take 1 tablet (75 mg total) by mouth daily. 90 tablet 1   ferrous sulfate 325 (65 FE) MG tablet Take 1 tablet (325 mg total) by mouth daily with breakfast. 90 tablet 1   gabapentin (NEURONTIN) 300 MG capsule TAKE 1 CAPSULE BY MOUTH AT NIGHT FOR 1 WEEK. INCREASE TO 1 CAPSULE 2 TIMES DAILY AS TOLERATED 60 capsule 3   Glycerin-Polysorbate 80 (REFRESH DRY EYE THERAPY OP) Place 1-2 drops into both eyes as needed (for dryness).      Insulin Syringe-Needle U-100 (INSULIN SYRINGE 1CC/31GX5/16") 31G X 5/16" 1 ML MISC USE AS DIRECTED WITH LANTUS 100 each 5   Lancets (ACCU-CHEK SOFT TOUCH) lancets Use 4 times daily. Patient now using prandial insulin. 100 each PRN   Multiple Vitamins-Minerals (MULTIVITAMIN ADULT PO) Take 1 tablet by mouth daily.     NEEDLE, DISP, 30 G (B-D DISP NEEDLE 30GX1") 30G X 1" MISC To use with Lantus. 100 each 3   ONETOUCH ULTRA test strip USE TO TEST UP TO 4 TIMES DAILY 100 strip 1   pantoprazole (PROTONIX) 20 MG tablet TAKE 1 TABLET BY MOUTH TWICE DAILY HALF AN HOURS OR 1 HOURS BEFORE EATING 180 tablet 3   VITAMIN D PO Take 1 tablet by mouth daily.     insulin glargine (LANTUS) 100 UNIT/ML Solostar Pen Inject 40 Units into the skin daily. 15 mL 1   atorvastatin (LIPITOR) 80 MG tablet Take 1 tablet (80 mg total) by mouth daily. (Patient not taking: Reported on 01/19/2021) 90 tablet 3   insulin aspart (NOVOLOG) 100 UNIT/ML injection Inject 5-15 units before meals 3 times daily. (Patient not taking: Reported on 01/19/2021) 10 mL PRN   Ascorbic Acid (VITAMIN C PO) Take 1 tablet by mouth as needed. (Patient not taking: Reported on 01/19/2021)     No facility-administered medications prior to visit.    Allergies  Allergen Reactions   Penicillins     Said she was young had a reaction    Sulfa Antibiotics Other (See Comments)    Pt unable to report reaction Said she had a rash    ROS Review of  Systems  Constitutional: Negative.   HENT: Negative.    Eyes:  Negative for photophobia and visual disturbance.  Respiratory: Negative.    Cardiovascular: Negative.   Gastrointestinal: Negative.   Endocrine: Negative for polyphagia and polyuria.  Genitourinary: Negative.  Neurological:  Negative for weakness and numbness.     Objective:    Physical Exam Vitals and nursing note reviewed.  Constitutional:      General: She is not in acute distress.    Appearance: Normal appearance. She is not ill-appearing or toxic-appearing.  HENT:     Head: Normocephalic and atraumatic.  Eyes:     Conjunctiva/sclera: Conjunctivae normal.  Pulmonary:     Effort: Pulmonary effort is normal.  Skin:    General: Skin is warm and dry.  Neurological:     Mental Status: She is alert.  Psychiatric:        Mood and Affect: Mood normal.        Behavior: Behavior normal.    BP 110/68 (BP Location: Left Arm, Patient Position: Sitting, Cuff Size: Normal)   Pulse 99   Temp (!) 96.5 F (35.8 C) (Temporal)   Ht _0  (1.702 m)   Wt 213 lb 12.8 oz (97 kg)   LMP  (LMP Unknown)   SpO2 99%   BMI 33.49 kg/m  Wt Readings from Last 3 Encounters:  01/19/21 213 lb 12.8 oz (97 kg)  07/10/20 212 lb 6.4 oz (96.3 kg)  06/23/20 211 lb 2 oz (95.8 kg)     Health Maintenance Due  Topic Date Due   TETANUS/TDAP  Never done   Zoster Vaccines- Shingrix (1 of 2) Never done   DEXA SCAN  Never done   PNA vac Low Risk Adult (1 of 2 - PCV13) Never done   OPHTHALMOLOGY EXAM  12/10/2020   FOOT EXAM  12/18/2020   INFLUENZA VACCINE  12/28/2020   HEMOGLOBIN A1C  01/07/2021    There are no preventive care reminders to display for this patient.  Lab Results  Component Value Date   TSH 1.22 08/02/2018   Lab Results  Component Value Date   WBC 5.8 06/23/2020   HGB 10.5 (L) 06/23/2020   HCT 31.5 (L) 06/23/2020   MCV 84.8 06/23/2020   PLT 464.0 (H) 06/23/2020   Lab Results  Component Value Date   NA 138  07/10/2020   K 4.3 07/10/2020   CO2 28 07/10/2020   GLUCOSE 124 (H) 07/10/2020   BUN 16 07/10/2020   CREATININE 1.20 07/10/2020   BILITOT 0.4 07/10/2020   ALKPHOS 93 07/10/2020   AST 16 07/10/2020   ALT 12 07/10/2020   PROT 7.7 07/10/2020   ALBUMIN 3.9 07/10/2020   CALCIUM 9.6 07/10/2020   ANIONGAP 5 04/09/2018   GFR 44.92 (L) 07/10/2020   Lab Results  Component Value Date   CHOL 129 07/10/2020   Lab Results  Component Value Date   HDL 36.60 (L) 07/10/2020   Lab Results  Component Value Date   LDLCALC 59 07/10/2020   Lab Results  Component Value Date   TRIG 166.0 (H) 07/10/2020   Lab Results  Component Value Date   CHOLHDL 4 07/10/2020   Lab Results  Component Value Date   HGBA1C 8.8 (H) 07/10/2020      Assessment & Plan:   Problem List Items Addressed This Visit       Cardiovascular and Mediastinum   Essential hypertension   Relevant Orders   Basic metabolic panel     Endocrine   Type 2 diabetes mellitus with diabetic neuropathy, with long-term current use of insulin (HCC) - Primary   Relevant Medications   insulin glargine (LANTUS) 100 UNIT/ML Solostar Pen   Other Relevant Orders   Basic metabolic panel  Hemoglobin A1c     Genitourinary   Stage 3b chronic kidney disease (HCC)   Relevant Orders   Basic metabolic panel     Other   Iron deficiency anemia   Relevant Orders   CBC   Iron, TIBC and Ferritin Panel    Meds ordered this encounter  Medications   insulin glargine (LANTUS) 100 UNIT/ML Solostar Pen    Sig: Inject 40 Units into the skin daily.    Dispense:  15 mL    Refill:  1     Follow-up: Return return in one month with all medicines that you are taking and a family member..  Start back on Lantus Lantus 35 to 40 units pending a.m. blood sugars.  She will use NovoLog before meals on a sliding scale.  She will bring in the sliding scale that she is using.  And concerned about patient's confusion.  I asked her to return in a month  again with all of her medicines and a family member.  Libby Maw, MD

## 2021-01-20 LAB — IRON,TIBC AND FERRITIN PANEL
%SAT: 26 % (calc) (ref 16–45)
Ferritin: 38 ng/mL (ref 16–288)
Iron: 84 ug/dL (ref 45–160)
TIBC: 329 mcg/dL (calc) (ref 250–450)

## 2021-02-08 ENCOUNTER — Other Ambulatory Visit: Payer: Self-pay | Admitting: Family Medicine

## 2021-02-23 ENCOUNTER — Ambulatory Visit: Payer: Medicare Other | Admitting: Family Medicine

## 2021-03-09 ENCOUNTER — Ambulatory Visit: Payer: Medicare Other | Admitting: Family Medicine

## 2021-04-06 ENCOUNTER — Other Ambulatory Visit: Payer: Self-pay | Admitting: Family Medicine

## 2021-05-05 ENCOUNTER — Telehealth: Payer: Self-pay | Admitting: Family Medicine

## 2021-05-05 NOTE — Telephone Encounter (Signed)
Left message for patient to call back to schedule Medicare Annual Wellness Visit   Last AWV  05/19/20  Please schedule at anytime with [facility] if patient calls the office back.    45 Minutes appointment   Any questions, please call me at [phone]

## 2021-05-17 ENCOUNTER — Other Ambulatory Visit: Payer: Self-pay | Admitting: Family Medicine

## 2021-05-17 ENCOUNTER — Other Ambulatory Visit: Payer: Self-pay | Admitting: Family

## 2021-05-17 ENCOUNTER — Other Ambulatory Visit: Payer: Self-pay | Admitting: Nurse Practitioner

## 2021-05-17 DIAGNOSIS — Z8673 Personal history of transient ischemic attack (TIA), and cerebral infarction without residual deficits: Secondary | ICD-10-CM

## 2021-05-17 DIAGNOSIS — I10 Essential (primary) hypertension: Secondary | ICD-10-CM

## 2021-05-18 NOTE — Telephone Encounter (Signed)
I tried to call patient to schedule but had to leave a message

## 2021-05-26 ENCOUNTER — Ambulatory Visit: Payer: Medicare Other

## 2021-06-01 ENCOUNTER — Other Ambulatory Visit: Payer: Self-pay

## 2021-06-01 ENCOUNTER — Encounter: Payer: Self-pay | Admitting: Family Medicine

## 2021-06-01 ENCOUNTER — Telehealth (INDEPENDENT_AMBULATORY_CARE_PROVIDER_SITE_OTHER): Payer: Medicare Other | Admitting: Family Medicine

## 2021-06-01 VITALS — Ht 67.0 in

## 2021-06-01 DIAGNOSIS — I10 Essential (primary) hypertension: Secondary | ICD-10-CM | POA: Diagnosis not present

## 2021-06-01 DIAGNOSIS — N1832 Chronic kidney disease, stage 3b: Secondary | ICD-10-CM | POA: Diagnosis not present

## 2021-06-01 DIAGNOSIS — U071 COVID-19: Secondary | ICD-10-CM | POA: Diagnosis not present

## 2021-06-01 DIAGNOSIS — R69 Illness, unspecified: Secondary | ICD-10-CM | POA: Diagnosis not present

## 2021-06-01 DIAGNOSIS — E1140 Type 2 diabetes mellitus with diabetic neuropathy, unspecified: Secondary | ICD-10-CM | POA: Diagnosis not present

## 2021-06-01 DIAGNOSIS — E7800 Pure hypercholesterolemia, unspecified: Secondary | ICD-10-CM

## 2021-06-01 DIAGNOSIS — Z794 Long term (current) use of insulin: Secondary | ICD-10-CM | POA: Diagnosis not present

## 2021-06-01 MED ORDER — INSULIN LISPRO (1 UNIT DIAL) 100 UNIT/ML (KWIKPEN): PEN_INJECTOR | SUBCUTANEOUS | 4 refills | Status: DC

## 2021-06-01 NOTE — Progress Notes (Signed)
Established Patient Office Visit  Subjective:  Patient ID: Tara Conner, female    DOB: [birth year]  Age: 76 y.o. MRN: [id number]  CC:  Chief Complaint  Patient presents with   Follow-up    6 month follow up on DM, refill on medications, no concerns.     HPI Tara Conner presents for follow-up of diabetes, elevated cholesterol and hypertension.  Blood pressure remains well controlled.  She is currently taking 40 units of Lantus at night with a sliding scale of Humalog regular preprandial 80 for her morning and evening meals.  Sliding scale is typically between 5 to 15 units.  No units if her blood sugar is less than 120.  It is rarely less than 120.  She admits to snacking heavily since October through the holiday season, but she is trying to get back on track at this time.  I check plan for the 16th of this month.  This was scheduled to be a live visit but her transportation did not pick her up.  She is alone in her room at her house.  Her granddaughter is there.  Past Medical History:  Diagnosis Date   Diabetes mellitus without complication ([place])    Diabetic retinopathy ([place])    PDR OU   Hypertension    Hypertensive retinopathy    OU   Stroke [facility]) 2019    Past Surgical History:  Procedure Laterality Date   CATARACT EXTRACTION Bilateral    COLONOSCOPY     [facility] possibly but not sure. Said it was negative   ESOPHAGOGASTRODUODENOSCOPY     around the same time said it was negative    EYE SURGERY Bilateral    Cat Sx OU   GALLBLADDER SURGERY      Family History  Problem Relation Age of Onset   Diabetes Mother    Diabetes Sister    Diabetes Brother    Colon cancer Neg Hx    Esophageal cancer Neg Hx    Rectal cancer Neg Hx    Stomach cancer Neg Hx     Social History   Socioeconomic History   Marital status: Single    Spouse name: Not on file   Number of children: Not on file   Years of education: Not on file   Highest education level: Not on  file  Occupational History   Not on file  Tobacco Use   Smoking status: Never   Smokeless tobacco: Never  Vaping Use   Vaping Use: Never used  Substance and Sexual Activity   Alcohol use: Never   Drug use: Never   Sexual activity: Not Currently  Other Topics Concern   Not on file  Social History Narrative   Not on file   Social Determinants of Health   Financial Resource Strain: Not on file  Food Insecurity: Not on file  Transportation Needs: Not on file  Physical Activity: Not on file  Stress: Not on file  Social Connections: Not on file  Intimate Partner Violence: Not on file    Outpatient Medications Prior to Visit  Medication Sig Dispense Refill   amLODipine (NORVASC) 5 MG tablet TAKE 1 TABLET(5 MG) BY MOUTH DAILY 90 tablet 3   atorvastatin (LIPITOR) 40 MG tablet TAKE 1 TABLET(40 MG) BY MOUTH DAILY 90 tablet 2   BD PEN NEEDLE NANO 2ND GEN 32G X 4 MM MISC USE WITH LANTUS DAILY 100 each 1   Blood Glucose Monitoring Suppl (ACCU-CHEK AVIVA PLUS) w/Device KIT USE TO  TEST BLOOD SUGAR TWICE DAILY 1 kit 1   clopidogrel (PLAVIX) 75 MG tablet TAKE 1 TABLET(75 MG) BY MOUTH DAILY 90 tablet 1   ferrous sulfate 325 (65 FE) MG tablet Take 1 tablet (325 mg total) by mouth daily with breakfast. 90 tablet 1   gabapentin (NEURONTIN) 300 MG capsule TAKE 1 CAPSULE BY MOUTH AT NIGHT FOR 1 WEEK. INCREASE TO 1 CAPSULE 2 TIMES DAILY AS TOLERATED 60 capsule 3   Glycerin-Polysorbate 80 (REFRESH DRY EYE THERAPY OP) Place 1-2 drops into both eyes as needed (for dryness).      insulin glargine (LANTUS) 100 UNIT/ML Solostar Pen Inject 40 Units into the skin daily. 15 mL 1   Insulin Syringe-Needle U-100 (INSULIN SYRINGE 1CC/31GX5/16") 31G X 5/16" 1 ML MISC USE AS DIRECTED WITH LANTUS 100 each 5   Lancets (ACCU-CHEK SOFT TOUCH) lancets Use 4 times daily. Patient now using prandial insulin. 100 each PRN   Multiple Vitamins-Minerals (MULTIVITAMIN ADULT PO) Take 1 tablet by mouth daily.     NEEDLE, DISP,  30 G (B-D DISP NEEDLE 30GX1") 30G X 1" MISC To use with Lantus. 100 each 3   ONETOUCH ULTRA test strip TEST UP TO FOUR TIMES DAILY 100 strip 1   pantoprazole (PROTONIX) 20 MG tablet TAKE 1 TABLET BY MOUTH TWICE DAILY HALF AN HOURS OR 1 HOURS BEFORE EATING 180 tablet 3   VITAMIN D PO Take 1 tablet by mouth daily.     HUMALOG KWIKPEN 100 UNIT/ML KwikPen ADMINISTER 0 TO 15 UNITS UNDER THE SKIN INTO SKIN THREE TIMES DAILY 15 mL 1   atorvastatin (LIPITOR) 80 MG tablet Take 1 tablet (80 mg total) by mouth daily. (Patient not taking: Reported on 01/19/2021) 90 tablet 3   cimetidine (TAGAMET) 300 MG tablet TAKE 1 TABLET(300 MG) BY MOUTH AT BEDTIME (Patient not taking: Reported on 06/01/2021) 90 tablet 4   insulin aspart (NOVOLOG) 100 UNIT/ML injection Inject 5-15 units before meals 3 times daily. (Patient not taking: Reported on 01/19/2021) 10 mL PRN   No facility-administered medications prior to visit.    Allergies  Allergen Reactions   Penicillins     Said she was young had a reaction    Sulfa Antibiotics Other (See Comments)    Pt unable to report reaction Said she had a rash    ROS Review of Systems  Constitutional:  Negative for chills, diaphoresis, fatigue, fever and unexpected weight change.  HENT: Negative.    Eyes:  Negative for photophobia and visual disturbance.  Respiratory: Negative.    Cardiovascular: Negative.   Gastrointestinal: Negative.   Endocrine: Negative for polyphagia and polyuria.  Genitourinary:  Negative for difficulty urinating, frequency and urgency.  Neurological:  Negative for speech difficulty and weakness.  Psychiatric/Behavioral: Negative.       Objective:    Physical Exam Nursing note reviewed.  Constitutional:      Appearance: Normal appearance.  HENT:     Head: Normocephalic and atraumatic.     Right Ear: External ear normal.     Left Ear: External ear normal.  Eyes:     General: No scleral icterus.       Right eye: No discharge.        Left  eye: No discharge.     Extraocular Movements: Extraocular movements intact.     Conjunctiva/sclera: Conjunctivae normal.  Pulmonary:     Effort: Pulmonary effort is normal.  Neurological:     Mental Status: She is alert and oriented to person, place,  and time.  Psychiatric:        Mood and Affect: Mood normal.        Behavior: Behavior normal.    Ht $R'5\' 7"'sh$  (1.702 m)    LMP  (LMP Unknown)    BMI 33.49 kg/m  Wt Readings from Last 3 Encounters:  01/19/21 213 lb 12.8 oz (97 kg)  07/10/20 212 lb 6.4 oz (96.3 kg)  06/23/20 211 lb 2 oz (95.8 kg)     Health Maintenance Due  Topic Date Due   Pneumonia Vaccine 28+ Years old (1 - PCV) Never done   TETANUS/TDAP  Never done   Zoster Vaccines- Shingrix (1 of 2) Never done   DEXA SCAN  Never done   OPHTHALMOLOGY EXAM  12/10/2020   FOOT EXAM  12/18/2020   URINE MICROALBUMIN  07/10/2021    There are no preventive care reminders to display for this patient.  Lab Results  Component Value Date   TSH 1.22 08/02/2018   Lab Results  Component Value Date   WBC 6.3 01/19/2021   HGB 10.5 (L) 01/19/2021   HCT 30.6 (L) 01/19/2021   MCV 84.8 01/19/2021   PLT 392.0 01/19/2021   Lab Results  Component Value Date   NA 134 (L) 01/19/2021   K 3.9 01/19/2021   CO2 23 01/19/2021   GLUCOSE 306 (H) 01/19/2021   BUN 25 (H) 01/19/2021   CREATININE 1.23 (H) 01/19/2021   BILITOT 0.4 07/10/2020   ALKPHOS 93 07/10/2020   AST 16 07/10/2020   ALT 12 07/10/2020   PROT 7.7 07/10/2020   ALBUMIN 3.9 07/10/2020   CALCIUM 9.3 01/19/2021   ANIONGAP 5 04/09/2018   GFR 43.44 (L) 01/19/2021   Lab Results  Component Value Date   CHOL 129 07/10/2020   Lab Results  Component Value Date   HDL 36.60 (L) 07/10/2020   Lab Results  Component Value Date   LDLCALC 59 07/10/2020   Lab Results  Component Value Date   TRIG 166.0 (H) 07/10/2020   Lab Results  Component Value Date   CHOLHDL 4 07/10/2020   Lab Results  Component Value Date   HGBA1C 11.0  (H) 01/19/2021      Assessment & Plan:   Problem List Items Addressed This Visit       Cardiovascular and Mediastinum   Essential hypertension   Relevant Orders   Basic metabolic panel   CBC   Urinalysis, Routine w reflex microscopic   Microalbumin / creatinine urine ratio     Endocrine   Type 2 diabetes mellitus with diabetic neuropathy, with long-term current use of insulin (HCC) - Primary   Relevant Medications   insulin lispro (HUMALOG KWIKPEN) 100 UNIT/ML KwikPen   Other Relevant Orders   Basic metabolic panel   CBC   Hemoglobin A1c   Urinalysis, Routine w reflex microscopic   Microalbumin / creatinine urine ratio     Genitourinary   Stage 3b chronic kidney disease (HCC)   Relevant Orders   Basic metabolic panel     Other   Elevated LDL cholesterol level   Relevant Orders   Lipid panel    Meds ordered this encounter  Medications   insulin lispro (HUMALOG KWIKPEN) 100 UNIT/ML KwikPen    Sig: ADMINISTER 0 TO 15 UNITS UNDER THE SKIN INTO SKIN THREE TIMES DAILY    Dispense:  15 mL    Refill:  4    Follow-up: Return in about 3 months (around 08/30/2021).  Will go to [address] to have ordered labs drawn.  She will be fasting.  Needs to be seen at the clinic for her next is it.  She is aware.  Libby Maw, MD

## 2021-06-02 ENCOUNTER — Other Ambulatory Visit (INDEPENDENT_AMBULATORY_CARE_PROVIDER_SITE_OTHER): Payer: Commercial Managed Care - HMO

## 2021-06-02 DIAGNOSIS — N1832 Chronic kidney disease, stage 3b: Secondary | ICD-10-CM

## 2021-06-02 DIAGNOSIS — E1140 Type 2 diabetes mellitus with diabetic neuropathy, unspecified: Secondary | ICD-10-CM | POA: Diagnosis not present

## 2021-06-02 DIAGNOSIS — Z794 Long term (current) use of insulin: Secondary | ICD-10-CM | POA: Diagnosis not present

## 2021-06-02 DIAGNOSIS — I10 Essential (primary) hypertension: Secondary | ICD-10-CM | POA: Diagnosis not present

## 2021-06-02 LAB — CBC
HCT: 29.3 % — ABNORMAL LOW (ref 36.0–46.0)
Hemoglobin: 9.7 g/dL — ABNORMAL LOW (ref 12.0–15.0)
MCHC: 33.1 g/dL (ref 30.0–36.0)
MCV: 86.6 fl (ref 78.0–100.0)
Platelets: 436 10*3/uL — ABNORMAL HIGH (ref 150.0–400.0)
RBC: 3.39 Mil/uL — ABNORMAL LOW (ref 3.87–5.11)
RDW: 13.8 % (ref 11.5–15.5)
WBC: 8.2 10*3/uL (ref 4.0–10.5)

## 2021-06-02 LAB — BASIC METABOLIC PANEL
BUN: 20 mg/dL (ref 6–23)
CO2: 27 mEq/L (ref 19–32)
Calcium: 9 mg/dL (ref 8.4–10.5)
Chloride: 103 mEq/L (ref 96–112)
Creatinine, Ser: 1.39 mg/dL — ABNORMAL HIGH (ref 0.40–1.20)
GFR: 37.42 mL/min — ABNORMAL LOW (ref >60.00)
Glucose, Bld: 208 mg/dL — ABNORMAL HIGH (ref 70–99)
Potassium: 3.9 mEq/L (ref 3.5–5.1)
Sodium: 137 mEq/L (ref 135–145)

## 2021-06-02 LAB — HEMOGLOBIN A1C: Hgb A1c MFr Bld: 8.7 % — ABNORMAL HIGH (ref 4.6–6.5)

## 2021-06-03 MED ORDER — EMPAGLIFLOZIN 10 MG PO TABS: 10.0000 mg | ORAL_TABLET | Freq: Every day | ORAL | 5 refills | Status: DC

## 2021-06-17 DIAGNOSIS — H26492 Other secondary cataract, left eye: Secondary | ICD-10-CM | POA: Diagnosis not present

## 2021-06-17 DIAGNOSIS — Z961 Presence of intraocular lens: Secondary | ICD-10-CM | POA: Diagnosis not present

## 2021-06-17 DIAGNOSIS — H35373 Puckering of macula, bilateral: Secondary | ICD-10-CM | POA: Diagnosis not present

## 2021-06-17 DIAGNOSIS — H40013 Open angle with borderline findings, low risk, bilateral: Secondary | ICD-10-CM | POA: Diagnosis not present

## 2021-06-17 DIAGNOSIS — E113593 Type 2 diabetes mellitus with proliferative diabetic retinopathy without macular edema, bilateral: Secondary | ICD-10-CM | POA: Diagnosis not present

## 2021-06-17 DIAGNOSIS — R69 Illness, unspecified: Secondary | ICD-10-CM | POA: Diagnosis not present

## 2021-06-17 LAB — HM DIABETES EYE EXAM

## 2021-06-18 DIAGNOSIS — R69 Illness, unspecified: Secondary | ICD-10-CM | POA: Diagnosis not present

## 2021-06-23 ENCOUNTER — Telehealth: Payer: Self-pay

## 2021-06-23 ENCOUNTER — Ambulatory Visit: Payer: Commercial Managed Care - HMO

## 2021-06-23 NOTE — Telephone Encounter (Signed)
Called patient for [place] x 3 patient answered and hung up the phone, patient ,may reschedule foe next available appointment.  L.Kassi Esteve,LPN

## 2021-07-12 ENCOUNTER — Other Ambulatory Visit: Payer: Self-pay | Admitting: Family Medicine

## 2021-07-18 DIAGNOSIS — Z20822 Contact with and (suspected) exposure to covid-19: Secondary | ICD-10-CM | POA: Diagnosis not present

## 2021-07-20 ENCOUNTER — Telehealth: Payer: Self-pay | Admitting: Family Medicine

## 2021-07-20 DIAGNOSIS — Z794 Long term (current) use of insulin: Secondary | ICD-10-CM

## 2021-07-20 DIAGNOSIS — E7800 Pure hypercholesterolemia, unspecified: Secondary | ICD-10-CM

## 2021-07-20 DIAGNOSIS — Z8673 Personal history of transient ischemic attack (TIA), and cerebral infarction without residual deficits: Secondary | ICD-10-CM

## 2021-07-20 DIAGNOSIS — I10 Essential (primary) hypertension: Secondary | ICD-10-CM

## 2021-07-20 NOTE — Telephone Encounter (Signed)
Caller Name: [employer] back phone #: [phone] ex [phone]  The reason for this request: Insurance now covers 100 day supply for all meds.   MEDICATION(S):Originally she said "ALL MEDICATIONS" but then listed these out, so you may want to double check.  amLODipine (NORVASC) 5 MG tablet atorvastatin (LIPITOR) 40 MG tablet cimetidine (TAGAMET) 300 MG tablet clopidogrel (PLAVIX) 75 MG tablet insulin lispro (HUMALOG KWIKPEN) 100 UNIT/ML KwikPen   Preferred Pharmacy: [id number] - [place], [place] - [address] AT [facility] OF [address] & [address]

## 2021-07-21 MED ORDER — INSULIN LISPRO (1 UNIT DIAL) 100 UNIT/ML (KWIKPEN): PEN_INJECTOR | SUBCUTANEOUS | 4 refills | Status: DC

## 2021-07-21 MED ORDER — AMLODIPINE BESYLATE 5 MG PO TABS: ORAL_TABLET | ORAL | 3 refills | Status: DC

## 2021-07-21 MED ORDER — ATORVASTATIN CALCIUM 40 MG PO TABS: ORAL_TABLET | ORAL | 2 refills | Status: DC

## 2021-07-21 MED ORDER — CLOPIDOGREL BISULFATE 75 MG PO TABS: ORAL_TABLET | ORAL | 1 refills | Status: DC

## 2021-07-21 NOTE — Telephone Encounter (Signed)
Refills sent in patient aware.  

## 2021-07-23 ENCOUNTER — Encounter (HOSPITAL_COMMUNITY): Payer: Self-pay | Admitting: Emergency Medicine

## 2021-07-23 ENCOUNTER — Emergency Department (HOSPITAL_COMMUNITY)
Admission: EM | Admit: 2021-07-23 | Discharge: 2021-07-24 | Disposition: A | Discharge status: Home / Self Care | Payer: Medicare Other | Attending: Emergency Medicine | Admitting: Emergency Medicine

## 2021-07-23 ENCOUNTER — Other Ambulatory Visit: Payer: Self-pay

## 2021-07-23 DIAGNOSIS — R040 Epistaxis: Secondary | ICD-10-CM | POA: Diagnosis not present

## 2021-07-23 DIAGNOSIS — R739 Hyperglycemia, unspecified: Secondary | ICD-10-CM | POA: Diagnosis not present

## 2021-07-23 LAB — CBC
HCT: 31.5 % — ABNORMAL LOW (ref 36.0–46.0)
Hemoglobin: 10.6 g/dL — ABNORMAL LOW (ref 12.0–15.0)
MCH: 28.6 pg (ref 26.0–34.0)
MCHC: 33.7 g/dL (ref 30.0–36.0)
MCV: 85.1 fL (ref 80.0–100.0)
Platelets: 414 10*3/uL — ABNORMAL HIGH (ref 150–400)
RBC: 3.7 MIL/uL — ABNORMAL LOW (ref 3.87–5.11)
RDW: 12.4 % (ref 11.5–15.5)
WBC: 8.6 10*3/uL (ref 4.0–10.5)
nRBC: 0 % (ref 0.0–0.2)

## 2021-07-23 LAB — COMPREHENSIVE METABOLIC PANEL
ALT: 19 U/L (ref 0–44)
AST: 20 U/L (ref 15–41)
Albumin: 3.4 g/dL — ABNORMAL LOW (ref 3.5–5.0)
Alkaline Phosphatase: 91 U/L (ref 38–126)
Anion gap: 11 (ref 5–15)
BUN: 19 mg/dL (ref 8–23)
CO2: 23 mmol/L (ref 22–32)
Calcium: 9.1 mg/dL (ref 8.9–10.3)
Chloride: 101 mmol/L (ref 98–111)
Creatinine, Ser: 1.19 mg/dL — ABNORMAL HIGH (ref 0.44–1.00)
GFR, Estimated: 48 mL/min — ABNORMAL LOW (ref >60)
Glucose, Bld: 265 mg/dL — ABNORMAL HIGH (ref 70–99)
Potassium: 3.5 mmol/L (ref 3.5–5.1)
Sodium: 135 mmol/L (ref 135–145)
Total Bilirubin: 0.1 mg/dL — ABNORMAL LOW (ref 0.3–1.2)
Total Protein: 7.8 g/dL (ref 6.5–8.1)

## 2021-07-23 LAB — PROTIME-INR
INR: 0.9 (ref 0.8–1.2)
Prothrombin Time: 12.4 seconds (ref 11.4–15.2)

## 2021-07-23 MED ORDER — OXYMETAZOLINE HCL 0.05 % NA SOLN
1.0000 | Freq: Once | NASAL | Status: AC
  Administered 2021-07-23: 1 via NASAL
  Filled 2021-07-23 (×2): qty 30

## 2021-07-23 NOTE — ED Provider Triage Note (Signed)
Emergency Medicine Provider Triage Evaluation Note  Tara Conner , a 76 y.o. female  was evaluated in triage.  Pt complains of nosebleed for >1 hour.  Mostly bleeding from right nostril.  Denies facial trauma or injury.  She is on plavix.  Review of Systems  Positive: nosebleed Negative: Fever, chills, facial trauma  Physical Exam  BP (!) 200/88    Pulse (!) 113    Temp (!) 97.4 F (36.3 C) (Oral)    Resp 16    LMP  (LMP Unknown)    SpO2 98%  Gen:   Awake, no distress   Resp:  Normal effort  MSK:   Moves extremities without difficulty  Other:  Right nostril with epistaxis present, some clot present, actively applying pressure  Medical Decision Making  Medically screening exam initiated at 10:12 PM.  Appropriate orders placed.  Avika Carbine was informed that the remainder of the evaluation will be completed by another provider, this initial triage assessment does not replace that evaluation, and the importance of remaining in the ED until their evaluation is complete.  Nosebleed, mostly right nostril.  Some clot present.  Will have her blow nose and apply afrin.  Patient also very hypertensive in triage at 200/88.  Denies headache or chest pain currently.     Garlon Hatchet, PA-C 07/23/21 2215

## 2021-07-23 NOTE — ED Triage Notes (Signed)
Pt reported to ED with c/o nose bleed for approximately 1 hr.

## 2021-07-24 NOTE — ED Provider Notes (Signed)
[facility] EMERGENCY DEPARTMENT Provider Note   CSN: [id number] Arrival date & time: 07/23/21  2110     History  Chief Complaint  Patient presents with   Epistaxis    Tara Conner is a 76 y.o. female.  The history is provided by the patient.  Epistaxis Location:  Bilateral Severity:  Moderate Timing:  Constant Progression:  Improving Chronicity:  New Context comment:  Antiplatelet meds Worsened by:  Nothing Ineffective treatments:  Applying pressure Associated symptoms: no fever   Patient reports spontaneous epistaxis.  She reports it is coming out of both of her nares.  No recent trauma.  She is also concerned about her blood pressure.  No other acute complaints    Home Medications Prior to Admission medications   Medication Sig Start Date End Date Taking? Authorizing Provider  amLODipine (NORVASC) 5 MG tablet TAKE 1 TABLET(5 MG) BY MOUTH DAILY 07/21/21   Libby Maw, MD  atorvastatin (LIPITOR) 40 MG tablet TAKE 1 TABLET(40 MG) BY MOUTH DAILY 07/21/21   Libby Maw, MD  atorvastatin (LIPITOR) 80 MG tablet Take 1 tablet (80 mg total) by mouth daily. Patient not taking: Reported on 01/19/2021 09/27/19   Libby Maw, MD  BD PEN NEEDLE NANO 2ND GEN 32G X 4 MM MISC USE WITH LANTUS DAILY 06/24/20   Dutch Quint B, FNP  Blood Glucose Monitoring Suppl (ACCU-CHEK AVIVA PLUS) w/Device KIT USE TO TEST BLOOD SUGAR TWICE DAILY 01/15/19   Libby Maw, MD  cimetidine (TAGAMET) 300 MG tablet TAKE 1 TABLET(300 MG) BY MOUTH AT BEDTIME Patient not taking: Reported on 06/01/2021 09/18/20   Libby Maw, MD  clopidogrel (PLAVIX) 75 MG tablet TAKE 1 TABLET(75 MG) BY MOUTH DAILY 07/21/21   Libby Maw, MD  empagliflozin (JARDIANCE) 10 MG TABS tablet Take 1 tablet (10 mg total) by mouth daily before breakfast. 06/03/21   Libby Maw, MD  ferrous sulfate 325 (65 FE) MG tablet Take 1 tablet (325 mg total) by  mouth daily with breakfast. 02/12/18   Libby Maw, MD  gabapentin (NEURONTIN) 300 MG capsule TAKE 1 CAPSULE BY MOUTH AT NIGHT FOR 1 WEEK. INCREASE TO 1 CAPSULE 2 TIMES DAILY AS TOLERATED 06/02/20   Dutch Quint B, FNP  Glycerin-Polysorbate 80 (REFRESH DRY EYE THERAPY OP) Place 1-2 drops into both eyes as needed (for dryness).     [provider]  insulin glargine (LANTUS) 100 UNIT/ML Solostar Pen Inject 40 Units into the skin daily. 01/19/21   Libby Maw, MD  insulin lispro (HUMALOG KWIKPEN) 100 UNIT/ML KwikPen ADMINISTER 0 TO 15 UNITS UNDER THE SKIN INTO SKIN THREE TIMES DAILY 07/21/21   Libby Maw, MD  Insulin Syringe-Needle U-100 (INSULIN SYRINGE 1CC/31GX5/16") 31G X 5/16" 1 ML MISC USE AS DIRECTED WITH LANTUS 02/27/20   Libby Maw, MD  Lancets (ACCU-CHEK SOFT [facility]) lancets Use 4 times daily. Patient now using prandial insulin. 07/19/19   Libby Maw, MD  Multiple Vitamins-Minerals (MULTIVITAMIN ADULT PO) Take 1 tablet by mouth daily.    [provider]  NEEDLE, DISP, 30 G (B-D DISP NEEDLE 30GX1") 30G X 1" MISC To use with Lantus. 04/06/18   Libby Maw, MD  [facility] ULTRA test strip USE TO TEST UP TO FOUR TIMES DAILY 07/12/21   Libby Maw, MD  pantoprazole (PROTONIX) 20 MG tablet TAKE 1 TABLET BY MOUTH TWICE DAILY HALF AN HOURS OR 1 HOURS BEFORE EATING 06/10/20   Cirigliano, Dominic Pea,  DO  VITAMIN D PO Take 1 tablet by mouth daily.    [provider]      Allergies    Penicillins and Sulfa antibiotics    Review of Systems   Review of Systems  Constitutional:  Negative for fever.  HENT:  Positive for nosebleeds.   Gastrointestinal:  Negative for vomiting.  CONSTITUTIONAL: Well developed/well nourished HEAD: Normocephalic/atraumatic EYES: EOMI/PERRL ENMT: Mucous membranes moist, dried blood noted to left nare.  No blood noted to right nare.  No active bleeding.  No blood in oropharynx NECK:  supple no meningeal signs LUNGS:  no apparent distress ABDOMEN: soft, nontender, no rebound or guarding, bowel sounds noted throughout abdomen GU:no cva tenderness NEURO: Pt is awake/alert/appropriate, moves all extremitiesx4.  No facial droop.   EXTREMITIES:  full ROM SKIN: warm, color normal PSYCH: no abnormalities of mood noted, alert and oriented to situation  Physical Exam Updated Vital Signs BP (!) 178/89    Pulse 96    Temp (!) 97.4 F (36.3 C) (Oral)    Resp 16    LMP  (LMP Unknown)    SpO2 98%  Physical Exam  ED Results / Procedures / Treatments   Labs (all labs ordered are listed, but only abnormal results are displayed) Labs Reviewed  CBC - Abnormal; Notable for the following components:      Result Value   RBC 3.70 (*)    Hemoglobin 10.6 (*)    HCT 31.5 (*)    Platelets 414 (*)    All other components within normal limits  COMPREHENSIVE METABOLIC PANEL - Abnormal; Notable for the following components:   Glucose, Bld 265 (*)    Creatinine, Ser 1.19 (*)    Albumin 3.4 (*)    Total Bilirubin 0.1 (*)    GFR, Estimated 48 (*)    All other components within normal limits  PROTIME-INR    EKG None  Radiology No results found.  Procedures Procedures    Medications Ordered in ED Medications  oxymetazoline (AFRIN) 0.05 % nasal spray 1 spray (1 spray Each Nare Given 07/23/21 2330)    ED Course/ Medical Decision Making/ A&P                           Medical Decision Making Amount and/or Complexity of Data Reviewed Labs: ordered.  Risk OTC drugs.   This patient presents to the ED for concern of epistaxis, this involves an extensive number of treatment options, and is a complaint that carries with it a high risk of complications and morbidity.  The differential diagnosis includes anterior epistaxis, posterior epistaxis  Comorbidities that complicate the patient evaluation: Patients presentation is complicated by their history of antiplatelet  use   Additional history obtained: Additional history obtained from family   Lab Tests: I Ordered, and personally interpreted labs.  The pertinent results include: Mild hyperglycemia  Cardiac Monitoring: The patient was maintained on a cardiac monitor.  I personally viewed and interpreted the cardiac monitor which showed an underlying rhythm of:  sinus rhythm  Medicines ordered and prescription drug management: I ordered medication including Afrin for epistaxis Reevaluation of the patient after these medicines showed that the patient    improved   Reevaluation: After the interventions noted above, I reevaluated the patient and found that they have :improved  Complexity of problems addressed: Patients presentation is most consistent with  acute complicated illness/injury requiring diagnostic workup      Disposition: After  consideration of the diagnostic results and the patients response to treatment,  I feel that the patent would benefit from discharge.    Patient presents with nontraumatic epistaxis.  She is now improved after Afrin.  No further bleeding.  She will continue her Plavix, we discussed her strict return precaution        Final Clinical Impression(s) / ED Diagnoses Final diagnoses:  Epistaxis    Rx / DC Orders ED Discharge Orders     None         Ripley Fraise, MD 07/24/21 0221

## 2021-08-09 ENCOUNTER — Other Ambulatory Visit: Payer: Self-pay

## 2021-08-10 ENCOUNTER — Encounter: Payer: Self-pay | Admitting: Family Medicine

## 2021-08-10 ENCOUNTER — Ambulatory Visit (INDEPENDENT_AMBULATORY_CARE_PROVIDER_SITE_OTHER): Payer: Medicare Other | Admitting: Family Medicine

## 2021-08-10 ENCOUNTER — Telehealth: Payer: Self-pay

## 2021-08-10 VITALS — BP 98/66 | HR 86 | Temp 97.0°F | Ht 67.0 in | Wt 218.6 lb

## 2021-08-10 DIAGNOSIS — I959 Hypotension, unspecified: Secondary | ICD-10-CM | POA: Insufficient documentation

## 2021-08-10 DIAGNOSIS — N1832 Chronic kidney disease, stage 3b: Secondary | ICD-10-CM | POA: Diagnosis not present

## 2021-08-10 DIAGNOSIS — I639 Cerebral infarction, unspecified: Secondary | ICD-10-CM | POA: Insufficient documentation

## 2021-08-10 DIAGNOSIS — R413 Other amnesia: Secondary | ICD-10-CM

## 2021-08-10 DIAGNOSIS — E7800 Pure hypercholesterolemia, unspecified: Secondary | ICD-10-CM

## 2021-08-10 LAB — BASIC METABOLIC PANEL
BUN: 19 mg/dL (ref 6–23)
CO2: 26 mEq/L (ref 19–32)
Calcium: 9.3 mg/dL (ref 8.4–10.5)
Chloride: 104 mEq/L (ref 96–112)
Creatinine, Ser: 1.04 mg/dL (ref 0.40–1.20)
GFR: 52.93 mL/min — ABNORMAL LOW (ref >60.00)
Glucose, Bld: 100 mg/dL — ABNORMAL HIGH (ref 70–99)
Potassium: 3.6 mEq/L (ref 3.5–5.1)
Sodium: 138 mEq/L (ref 135–145)

## 2021-08-10 LAB — LIPID PANEL
Cholesterol: 111 mg/dL (ref 0–200)
HDL: 34.4 mg/dL — ABNORMAL LOW (ref >39.00)
LDL Cholesterol: 56 mg/dL (ref 0–99)
NonHDL: 76.14
Total CHOL/HDL Ratio: 3
Triglycerides: 102 mg/dL (ref 0.0–149.0)
VLDL: 20.4 mg/dL (ref 0.0–40.0)

## 2021-08-10 LAB — LDL CHOLESTEROL, DIRECT: Direct LDL: 54 mg/dL

## 2021-08-10 MED ORDER — ATORVASTATIN CALCIUM 40 MG PO TABS: ORAL_TABLET | ORAL | 2 refills | Status: DC

## 2021-08-10 MED ORDER — LISINOPRIL 10 MG PO TABS: 10.0000 mg | ORAL_TABLET | Freq: Every day | ORAL | 0 refills | Status: DC

## 2021-08-10 NOTE — Progress Notes (Signed)
Established Patient Office Visit  Subjective:  Patient ID: Tara Conner, female    DOB: 15-Feb-1946  Age: 76 y.o. MRN: [id number]  CC:  Chief Complaint  Patient presents with   Follow-up    3 month follow up home nurse suggest foot exam. Patient fasting.     HPI Tara Conner presents for follow-up of hypertension, diabetes and elevated cholesterol.  Pressure is low today on amlodipine 5 mg daily.  Seen recently in the emergency room with elevated blood pressure and epistaxis.  Blood pressure normalized after she had been there for a while.  Continues Lantus daily with preprandial Humalog.  She is accompanied by her daughter.  Past Medical History:  Diagnosis Date   Diabetes mellitus without complication (HCC)    Diabetic retinopathy (HCC)    PDR OU   Hypertension    Hypertensive retinopathy    OU   Stroke [facility]) 2019    Past Surgical History:  Procedure Laterality Date   CATARACT EXTRACTION Bilateral    COLONOSCOPY     [facility] possibly but not sure. Said it was negative   ESOPHAGOGASTRODUODENOSCOPY     around the same time said it was negative    EYE SURGERY Bilateral    Cat Sx OU   GALLBLADDER SURGERY      Family History  Problem Relation Age of Onset   Diabetes Mother    Diabetes Sister    Diabetes Brother    Colon cancer Neg Hx    Esophageal cancer Neg Hx    Rectal cancer Neg Hx    Stomach cancer Neg Hx     Social History   Socioeconomic History   Marital status: Single    Spouse name: Not on file   Number of children: Not on file   Years of education: Not on file   Highest education level: Not on file  Occupational History   Not on file  Tobacco Use   Smoking status: Never   Smokeless tobacco: Never  Vaping Use   Vaping Use: Never used  Substance and Sexual Activity   Alcohol use: Never   Drug use: Never   Sexual activity: Not Currently  Other Topics Concern   Not on file  Social History Narrative   Not on file    Social Determinants of Health   Financial Resource Strain: Not on file  Food Insecurity: Not on file  Transportation Needs: Not on file  Physical Activity: Not on file  Stress: Not on file  Social Connections: Not on file  Intimate Partner Violence: Not on file    Outpatient Medications Prior to Visit  Medication Sig Dispense Refill   amLODipine (NORVASC) 5 MG tablet TAKE 1 TABLET(5 MG) BY MOUTH DAILY 100 tablet 3   BD PEN NEEDLE NANO 2ND GEN 32G X 4 MM MISC USE WITH LANTUS DAILY 100 each 1   Blood Glucose Monitoring Suppl (ACCU-CHEK AVIVA PLUS) w/Device KIT USE TO TEST BLOOD SUGAR TWICE DAILY 1 kit 1   cimetidine (TAGAMET) 300 MG tablet TAKE 1 TABLET(300 MG) BY MOUTH AT BEDTIME 90 tablet 4   clopidogrel (PLAVIX) 75 MG tablet TAKE 1 TABLET(75 MG) BY MOUTH DAILY 100 tablet 1   ferrous sulfate 325 (65 FE) MG tablet Take 1 tablet (325 mg total) by mouth daily with breakfast. 90 tablet 1   gabapentin (NEURONTIN) 300 MG capsule TAKE 1 CAPSULE BY MOUTH AT NIGHT FOR 1 WEEK. INCREASE TO 1 CAPSULE 2 TIMES DAILY AS TOLERATED 60 capsule  3   Glycerin-Polysorbate 80 (REFRESH DRY EYE THERAPY OP) Place 1-2 drops into both eyes as needed (for dryness).      insulin glargine (LANTUS) 100 UNIT/ML Solostar Pen Inject 40 Units into the skin daily. 15 mL 1   insulin lispro (HUMALOG KWIKPEN) 100 UNIT/ML KwikPen ADMINISTER 0 TO 15 UNITS UNDER THE SKIN INTO SKIN THREE TIMES DAILY 15 mL 4   Insulin Syringe-Needle U-100 (INSULIN SYRINGE 1CC/31GX5/16") 31G X 5/16" 1 ML MISC USE AS DIRECTED WITH LANTUS 100 each 5   Lancets (ACCU-CHEK SOFT TOUCH) lancets Use 4 times daily. Patient now using prandial insulin. 100 each PRN   Multiple Vitamins-Minerals (MULTIVITAMIN ADULT PO) Take 1 tablet by mouth daily.     NEEDLE, DISP, 30 G (B-D DISP NEEDLE 30GX1") 30G X 1" MISC To use with Lantus. 100 each 3   ONETOUCH ULTRA test strip USE TO TEST UP TO FOUR TIMES DAILY 100 strip 1   VITAMIN D PO Take 1 tablet by mouth daily.      atorvastatin (LIPITOR) 40 MG tablet TAKE 1 TABLET(40 MG) BY MOUTH DAILY 100 tablet 2   atorvastatin (LIPITOR) 80 MG tablet Take 1 tablet (80 mg total) by mouth daily. (Patient not taking: Reported on 08/10/2021) 90 tablet 3   empagliflozin (JARDIANCE) 10 MG TABS tablet Take 1 tablet (10 mg total) by mouth daily before breakfast. (Patient not taking: Reported on 08/10/2021) 30 tablet 5   pantoprazole (PROTONIX) 20 MG tablet TAKE 1 TABLET BY MOUTH TWICE DAILY HALF AN HOURS OR 1 HOURS BEFORE EATING (Patient not taking: Reported on 08/10/2021) 180 tablet 3   No facility-administered medications prior to visit.    Allergies  Allergen Reactions   Penicillins     Said she was young had a reaction    Sulfa Antibiotics Other (See Comments)    Pt unable to report reaction Said she had a rash    ROS Review of Systems  Constitutional:  Negative for diaphoresis, fatigue, fever and unexpected weight change.  HENT: Negative.    Eyes:  Negative for photophobia and visual disturbance.  Respiratory: Negative.    Cardiovascular: Negative.   Gastrointestinal: Negative.   Endocrine: Negative for polyphagia and polyuria.  Genitourinary: Negative.   Musculoskeletal:  Negative for gait problem and joint swelling.  Neurological:  Negative for light-headedness and headaches.     Objective:    Physical Exam Vitals and nursing note reviewed.  Constitutional:      General: She is not in acute distress.    Appearance: Normal appearance. She is not ill-appearing, toxic-appearing or diaphoretic.  HENT:     Head: Normocephalic and atraumatic.     Right Ear: External ear normal.     Left Ear: External ear normal.     Mouth/Throat:     Mouth: Mucous membranes are moist.     Pharynx: Oropharynx is clear. No oropharyngeal exudate or posterior oropharyngeal erythema.  Eyes:     General: No scleral icterus.       Right eye: No discharge.        Left eye: No discharge.     Extraocular Movements: Extraocular  movements intact.     Conjunctiva/sclera: Conjunctivae normal.     Pupils: Pupils are equal, round, and reactive to light.  Neck:     Vascular: No carotid bruit.  Cardiovascular:     Rate and Rhythm: Normal rate and regular rhythm.  Pulmonary:     Effort: Pulmonary effort is normal.  Breath sounds: Normal breath sounds.  Abdominal:     General: Bowel sounds are normal.  Musculoskeletal:     Cervical back: No rigidity or tenderness.  Lymphadenopathy:     Cervical: No cervical adenopathy.  Skin:    General: Skin is warm and dry.  Neurological:     Mental Status: She is alert and oriented to person, place, and time.  Psychiatric:        Mood and Affect: Mood normal.        Behavior: Behavior normal.    BP 98/66 (BP Location: Right Arm, Patient Position: Sitting, Cuff Size: Large)   Pulse 86   Temp (!) 97 F (36.1 C) (Temporal)   Ht 5\' 7"  (1.702 m)   Wt 218 lb 9.6 oz (99.2 kg)   LMP  (LMP Unknown)   SpO2 97%   BMI 34.24 kg/m  Wt Readings from Last 3 Encounters:  08/10/21 218 lb 9.6 oz (99.2 kg)  01/19/21 213 lb 12.8 oz (97 kg)  07/10/20 212 lb 6.4 oz (96.3 kg)     Health Maintenance Due  Topic Date Due   DEXA SCAN  Never done   FOOT EXAM  12/18/2020   URINE MICROALBUMIN  07/10/2021    There are no preventive care reminders to display for this patient.  Lab Results  Component Value Date   TSH 1.22 08/02/2018   Lab Results  Component Value Date   WBC 8.6 07/23/2021   HGB 10.6 (L) 07/23/2021   HCT 31.5 (L) 07/23/2021   MCV 85.1 07/23/2021   PLT 414 (H) 07/23/2021   Lab Results  Component Value Date   NA 135 07/23/2021   K 3.5 07/23/2021   CO2 23 07/23/2021   GLUCOSE 265 (H) 07/23/2021   BUN 19 07/23/2021   CREATININE 1.19 (H) 07/23/2021   BILITOT 0.1 (L) 07/23/2021   ALKPHOS 91 07/23/2021   AST 20 07/23/2021   ALT 19 07/23/2021   PROT 7.8 07/23/2021   ALBUMIN 3.4 (L) 07/23/2021   CALCIUM 9.1 07/23/2021   ANIONGAP 11 07/23/2021   GFR 37.42 (L)  06/02/2021   Lab Results  Component Value Date   CHOL 129 07/10/2020   Lab Results  Component Value Date   HDL 36.60 (L) 07/10/2020   Lab Results  Component Value Date   LDLCALC 59 07/10/2020   Lab Results  Component Value Date   TRIG 166.0 (H) 07/10/2020   Lab Results  Component Value Date   CHOLHDL 4 07/10/2020   Lab Results  Component Value Date   HGBA1C 8.7 (H) 06/02/2021      Assessment & Plan:   Problem List Items Addressed This Visit       Cardiovascular and Mediastinum   Hypotension   Relevant Medications   atorvastatin (LIPITOR) 40 MG tablet   lisinopril (ZESTRIL) 10 MG tablet   Other Relevant Orders   Basic metabolic panel     Genitourinary   Stage 3b chronic kidney disease (HCC) - Primary   Relevant Orders   Basic metabolic panel     Other   Elevated LDL cholesterol level   Relevant Medications   atorvastatin (LIPITOR) 40 MG tablet   Other Relevant Orders   Lipid panel   LDL cholesterol, direct    Meds ordered this encounter  Medications   atorvastatin (LIPITOR) 40 MG tablet    Sig: TAKE 1 TABLET(40 MG) BY MOUTH DAILY    Dispense:  100 tablet    Refill:  2  lisinopril (ZESTRIL) 10 MG tablet    Sig: Take 1 tablet (10 mg total) by mouth daily.    Dispense:  90 tablet    Refill:  0    Follow-up: Return in about 4 weeks (around 09/07/2021).  Will hold amlodipine.  We will add lisinopril 10 mg daily.  Check and record blood pressures daily.  Concerned that she may be doubling up on medicines.  Daughter will check.  Have discontinued 80 mg of atorvastatin favoring 40.  Continue Lantus and preprandial Humalog.  Expressed my concern about patient's forgetfulness and confusion in the last few encounters.  Advised the daughter that a family member should be present for future encounters.  She agreed.  She will keep a closer  Mliss Sax, MD

## 2021-08-10 NOTE — Telephone Encounter (Signed)
Patients daughter in office with patient for visit today would like to go over some of the families concerns about patients memory and would also like to update Provider with some of patients history. Not sure what they need to do about patients memory loss. Would like a phone when available Aram Beecham [phone] ? ?

## 2021-08-11 NOTE — Telephone Encounter (Signed)
Appointment scheduled patients daughter aware.  ?

## 2021-08-31 ENCOUNTER — Other Ambulatory Visit: Payer: Self-pay | Admitting: Family

## 2021-08-31 ENCOUNTER — Other Ambulatory Visit: Payer: Self-pay | Admitting: Family Medicine

## 2021-08-31 DIAGNOSIS — E1140 Type 2 diabetes mellitus with diabetic neuropathy, unspecified: Secondary | ICD-10-CM

## 2021-08-31 MED ORDER — GABAPENTIN 300 MG PO CAPS: ORAL_CAPSULE | ORAL | 3 refills | Status: DC

## 2021-08-31 NOTE — Telephone Encounter (Signed)
Refill request for pending Rx last refill 06/02/20 last OV 08/10/21. Please advise  ?

## 2021-08-31 NOTE — Telephone Encounter (Signed)
Caller Name: Ishana Blades ?Call back phone #: [phone] ? ?MEDICATION(S): Lancets and gabapentin ? ?Has the patient contacted their pharmacy (YES/NO)?  yes ?IF YES, when and what did the pharmacy advise? They told her to contact us  ?IF NO, request that the patient contact the pharmacy for the refills in the future.  ?           The pharmacy will send an electronic request (except for controlled medications). ? ? ? ?~~~Please advise patient/caregiver to allow 2-3 business days to process RX refills.  ?

## 2021-09-08 ENCOUNTER — Telehealth: Payer: Self-pay | Admitting: Family Medicine

## 2021-09-08 ENCOUNTER — Ambulatory Visit: Payer: Medicare Other | Admitting: Family Medicine

## 2021-09-08 NOTE — Telephone Encounter (Signed)
Pt states her dentist faxed a form. She is on blood thinner and needs approval to d/c before going for dental procedure. Please complete and return form to the dentist so she can reschedule. Please notify pt when done.  ? ?Call back [phone] ?

## 2021-09-15 NOTE — Telephone Encounter (Signed)
Form pending to be viewed and signed by Provider.  ?

## 2021-09-22 ENCOUNTER — Ambulatory Visit (INDEPENDENT_AMBULATORY_CARE_PROVIDER_SITE_OTHER): Payer: Medicare Other

## 2021-09-22 DIAGNOSIS — Z1231 Encounter for screening mammogram for malignant neoplasm of breast: Secondary | ICD-10-CM

## 2021-09-22 DIAGNOSIS — Z0000 Encounter for general adult medical examination without abnormal findings: Secondary | ICD-10-CM | POA: Diagnosis not present

## 2021-09-22 DIAGNOSIS — Z780 Asymptomatic menopausal state: Secondary | ICD-10-CM

## 2021-09-22 NOTE — Patient Instructions (Signed)
Tara Conner , ?Thank you for taking time to come for your Medicare Wellness Visit. I appreciate your ongoing commitment to your health goals. Please review the following plan we discussed and let me know if I can assist you in the future.  ? ?Screening recommendations/referrals: ?Colonoscopy: 03/31/2020 ?Mammogram: referral 09/22/2021 ?Bone Density: referral 09/22/2021 ?Recommended yearly ophthalmology/optometry visit for glaucoma screening and checkup ?Recommended yearly dental visit for hygiene and checkup ? ?Vaccinations: ?Influenza vaccine: declined  ?Pneumococcal vaccine: due  ?Tdap vaccine: due  ?Shingles vaccine: will consider    ? ?Advanced directives: none  ? ?Conditions/risks identified: none  ? ?Next appointment: none  ? ? ?Preventive Care 75 Years and Older, Female ?Preventive care refers to lifestyle choices and visits with your health care provider that can promote health and wellness. ?What does preventive care include? ?A yearly physical exam. This is also called an annual well check. ?Dental exams once or twice a year. ?Routine eye exams. Ask your health care provider how often you should have your eyes checked. ?Personal lifestyle choices, including: ?Daily care of your teeth and gums. ?Regular physical activity. ?Eating a healthy diet. ?Avoiding tobacco and drug use. ?Limiting alcohol use. ?Practicing safe sex. ?Taking low-dose aspirin every day. ?Taking vitamin and mineral supplements as recommended by your health care provider. ?What happens during an annual well check? ?The services and screenings done by your health care provider during your annual well check will depend on your age, overall health, lifestyle risk factors, and family history of disease. ?Counseling  ?Your health care provider may ask you questions about your: ?Alcohol use. ?Tobacco use. ?Drug use. ?Emotional well-being. ?Home and relationship well-being. ?Sexual activity. ?Eating habits. ?History of falls. ?Memory and  ability to understand (cognition). ?Work and work Statistician. ?Reproductive health. ?Screening  ?You may have the following tests or measurements: ?Height, weight, and BMI. ?Blood pressure. ?Lipid and cholesterol levels. These may be checked every 5 years, or more frequently if you are over 35 years old. ?Skin check. ?Lung cancer screening. You may have this screening every year starting at age 98 if you have a 30-pack-year history of smoking and currently smoke or have quit within the past 15 years. ?Fecal occult blood test (FOBT) of the stool. You may have this test every year starting at age 11. ?Flexible sigmoidoscopy or colonoscopy. You may have a sigmoidoscopy every 5 years or a colonoscopy every 10 years starting at age 59. ?Hepatitis C blood test. ?Hepatitis B blood test. ?Sexually transmitted disease (STD) testing. ?Diabetes screening. This is done by checking your blood sugar (glucose) after you have not eaten for a while (fasting). You may have this done every 1-3 years. ?Bone density scan. This is done to screen for osteoporosis. You may have this done starting at age 8. ?Mammogram. This may be done every 1-2 years. Talk to your health care provider about how often you should have regular mammograms. ?Talk with your health care provider about your test results, treatment options, and if necessary, the need for more tests. ?Vaccines  ?Your health care provider may recommend certain vaccines, such as: ?Influenza vaccine. This is recommended every year. ?Tetanus, diphtheria, and acellular pertussis (Tdap, Td) vaccine. You may need a Td booster every 10 years. ?Zoster vaccine. You may need this after age 19. ?Pneumococcal 13-valent conjugate (PCV13) vaccine. One dose is recommended after age 49. ?Pneumococcal polysaccharide (PPSV23) vaccine. One dose is recommended after age 85. ?Talk to your health care provider about which screenings and vaccines you need  and how often you need them. ?This information is  not intended to replace advice given to you by your health care provider. Make sure you discuss any questions you have with your health care provider. ?Document Released: 06/12/2015 Document Revised: 02/03/2016 Document Reviewed: 03/17/2015 ?Elsevier Interactive Patient Education ? 2017 [place]. ? ?Fall Prevention in the Home ?Falls can cause injuries. They can happen to people of all ages. There are many things you can do to make your home safe and to help prevent falls. ?What can I do on the outside of my home? ?Regularly fix the edges of walkways and driveways and fix any cracks. ?Remove anything that might make you trip as you walk through a door, such as a raised step or threshold. ?Trim any bushes or trees on the path to your home. ?Use bright outdoor lighting. ?Clear any walking paths of anything that might make someone trip, such as rocks or tools. ?Regularly check to see if handrails are loose or broken. Make sure that both sides of any steps have handrails. ?Any raised decks and porches should have guardrails on the edges. ?Have any leaves, snow, or ice cleared regularly. ?Use sand or salt on walking paths during winter. ?Clean up any spills in your garage right away. This includes oil or grease spills. ?What can I do in the bathroom? ?Use night lights. ?Install grab bars by the toilet and in the tub and shower. Do not use towel bars as grab bars. ?Use non-skid mats or decals in the tub or shower. ?If you need to sit down in the shower, use a plastic, non-slip stool. ?Keep the floor dry. Clean up any water that spills on the floor as soon as it happens. ?Remove soap buildup in the tub or shower regularly. ?Attach bath mats securely with double-sided non-slip rug tape. ?Do not have throw rugs and other things on the floor that can make you trip. ?What can I do in the bedroom? ?Use night lights. ?Make sure that you have a light by your bed that is easy to reach. ?Do not use any sheets or blankets that  are too big for your bed. They should not hang down onto the floor. ?Have a firm chair that has side arms. You can use this for support while you get dressed. ?Do not have throw rugs and other things on the floor that can make you trip. ?What can I do in the kitchen? ?Clean up any spills right away. ?Avoid walking on wet floors. ?Keep items that you use a lot in easy-to-reach places. ?If you need to reach something above you, use a strong step stool that has a grab bar. ?Keep electrical cords out of the way. ?Do not use floor polish or wax that makes floors slippery. If you must use wax, use non-skid floor wax. ?Do not have throw rugs and other things on the floor that can make you trip. ?What can I do with my stairs? ?Do not leave any items on the stairs. ?Make sure that there are handrails on both sides of the stairs and use them. Fix handrails that are broken or loose. Make sure that handrails are as long as the stairways. ?Check any carpeting to make sure that it is firmly attached to the stairs. Fix any carpet that is loose or worn. ?Avoid having throw rugs at the top or bottom of the stairs. If you do have throw rugs, attach them to the floor with carpet tape. ?Make sure that you  have a light switch at the top of the stairs and the bottom of the stairs. If you do not have them, ask someone to add them for you. ?What else can I do to help prevent falls? ?Wear shoes that: ?Do not have high heels. ?Have rubber bottoms. ?Are comfortable and fit you well. ?Are closed at the toe. Do not wear sandals. ?If you use a stepladder: ?Make sure that it is fully opened. Do not climb a closed stepladder. ?Make sure that both sides of the stepladder are locked into place. ?Ask someone to hold it for you, if possible. ?Clearly mark and make sure that you can see: ?Any grab bars or handrails. ?First and last steps. ?Where the edge of each step is. ?Use tools that help you move around (mobility aids) if they are needed. These  include: ?Canes. ?Walkers. ?Scooters. ?Crutches. ?Turn on the lights when you go into a dark area. Replace any light bulbs as soon as they burn out. ?Set up your furniture so you have a clear path. Avoid movin

## 2021-09-22 NOTE — Progress Notes (Signed)
? ?Subjective:  ? Tara Conner is a 76 y.o. female who presents for Medicare Annual (Subsequent) preventive examination. ? ?I connected with Norva Bowe today by telephone and verified that I am speaking with the correct person using two identifiers. ?Location patient: home ?Location provider: work ?Persons participating in the virtual visit: patient, provider. ?  ?I discussed the limitations, risks, security and privacy concerns of performing an evaluation and management service by telephone and the availability of in person appointments. I also discussed with the patient that there may be a patient responsible charge related to this service. The patient expressed understanding and verbally consented to this telephonic visit.  ?  ?Interactive audio and video telecommunications were attempted between this provider and patient, however failed, due to patient having technical difficulties OR patient did not have access to video capability.  We continued and completed visit with audio only. ? ?  ?Review of Systems    ? ?Cardiac Risk Factors include: advanced age (>14mn, >>44women);diabetes mellitus;hypertension;dyslipidemia ? ?   ?Objective:  ?  ?Today's Vitals  ? ?There is no height or weight on file to calculate BMI. ? ? ?  09/22/2021  ? 11:38 AM 07/23/2021  ?  9:35 PM 05/19/2020  ?  1:47 PM 05/15/2019  ? 11:06 AM 04/07/2018  ?  5:12 PM 04/07/2018  ? 12:22 PM 03/27/2018  ?  6:12 PM  ?Advanced Directives  ?Does Patient Have a Medical Advance Directive? No No No No No No No  ?Would patient like information on creating a medical advance directive? No - Patient declined  Yes (MAU/Ambulatory/Procedural Areas - Information given) No - Patient declined No - Patient declined Yes (ED - Information included in AVS)   ? ? ?Current Medications (verified) ?Outpatient Encounter Medications as of 09/22/2021  ?Medication Sig  ? amLODipine (NORVASC) 5 MG tablet TAKE 1 TABLET(5 MG) BY MOUTH DAILY  ? atorvastatin (LIPITOR) 40  MG tablet TAKE 1 TABLET(40 MG) BY MOUTH DAILY  ? BD PEN NEEDLE NANO 2ND GEN 32G X 4 MM MISC USE WITH LANTUS DAILY  ? Blood Glucose Monitoring Suppl (ACCU-CHEK AVIVA PLUS) w/Device KIT USE TO TEST BLOOD SUGAR TWICE DAILY  ? cimetidine (TAGAMET) 300 MG tablet TAKE 1 TABLET(300 MG) BY MOUTH AT BEDTIME  ? clopidogrel (PLAVIX) 75 MG tablet TAKE 1 TABLET(75 MG) BY MOUTH DAILY  ? ferrous sulfate 325 (65 FE) MG tablet Take 1 tablet (325 mg total) by mouth daily with breakfast.  ? gabapentin (NEURONTIN) 300 MG capsule TAKE 1 CAPSULE BY MOUTH AT NIGHT FOR 1 WEEK. INCREASE TO 1 CAPSULE 2 TIMES DAILY AS TOLERATED  ? Glycerin-Polysorbate 80 (REFRESH DRY EYE THERAPY OP) Place 1-2 drops into both eyes as needed (for dryness).   ? insulin glargine (LANTUS) 100 UNIT/ML Solostar Pen Inject 40 Units into the skin daily.  ? insulin lispro (HUMALOG KWIKPEN) 100 UNIT/ML KwikPen ADMINISTER 0 TO 15 UNITS UNDER THE SKIN INTO SKIN THREE TIMES DAILY  ? Insulin Syringe-Needle U-100 (INSULIN SYRINGE 1CC/31GX5/16") 31G X 5/16" 1 ML MISC USE AS DIRECTED WITH LANTUS  ? Lancets (ACCU-CHEK SOFT TOUCH) lancets Use 4 times daily. Patient now using prandial insulin.  ? lisinopril (ZESTRIL) 10 MG tablet Take 1 tablet (10 mg total) by mouth daily.  ? Multiple Vitamins-Minerals (MULTIVITAMIN ADULT PO) Take 1 tablet by mouth daily.  ? NEEDLE, DISP, 30 G (B-D DISP NEEDLE 30GX1") 30G X 1" MISC To use with Lantus.  ? ONETOUCH ULTRA test strip USE TO TEST UP TO FOUR  TIMES DAILY  ? VITAMIN D PO Take 1 tablet by mouth daily.  ? ?No facility-administered encounter medications on file as of 09/22/2021.  ? ? ?Allergies (verified) ?Penicillins and Sulfa antibiotics  ? ?History: ?Past Medical History:  ?Diagnosis Date  ? Diabetes mellitus without complication ([place])   ? Diabetic retinopathy ([place])   ? PDR OU  ? Hypertension   ? Hypertensive retinopathy   ? OU  ? Stroke [facility]) 2019  ? ?Past Surgical History:  ?Procedure Laterality Date  ? CATARACT EXTRACTION Bilateral   ?  COLONOSCOPY    ? [facility] possibly but not sure. Michela Pitcher it was negative  ? ESOPHAGOGASTRODUODENOSCOPY    ? around the same time said it was negative   ? EYE SURGERY Bilateral   ? Cat Sx OU  ? GALLBLADDER SURGERY    ? ?Family History  ?Problem Relation Age of Onset  ? Diabetes Mother   ? Diabetes Sister   ? Diabetes Brother   ? Colon cancer Neg Hx   ? Esophageal cancer Neg Hx   ? Rectal cancer Neg Hx   ? Stomach cancer Neg Hx   ? ?Social History  ? ?Socioeconomic History  ? Marital status: Single  ?  Spouse name: Not on file  ? Number of children: Not on file  ? Years of education: Not on file  ? Highest education level: Not on file  ?Occupational History  ? Not on file  ?Tobacco Use  ? Smoking status: Never  ? Smokeless tobacco: Never  ?Vaping Use  ? Vaping Use: Never used  ?Substance and Sexual Activity  ? Alcohol use: Never  ? Drug use: Never  ? Sexual activity: Not Currently  ?Other Topics Concern  ? Not on file  ?Social History Narrative  ? Not on file  ? ?Social Determinants of Health  ? ?Financial Resource Strain: Low Risk   ? Difficulty of Paying Living Expenses: Not hard at all  ?Food Insecurity: No Food Insecurity  ? Worried About Charity fundraiser in the Last Year: Never true  ? Ran Out of Food in the Last Year: Never true  ?Transportation Needs: No Transportation Needs  ? Lack of Transportation (Medical): No  ? Lack of Transportation (Non-Medical): No  ?Physical Activity: Insufficiently Active  ? Days of Exercise per Week: 2 days  ? Minutes of Exercise per Session: 20 min  ?Stress: No Stress Concern Present  ? Feeling of Stress : Not at all  ?Social Connections: Moderately Isolated  ? Frequency of Communication with Friends and Family: Three times a week  ? Frequency of Social Gatherings with Friends and Family: Three times a week  ? Attends Religious Services: More than 4 times per year  ? Active Member of Clubs or Organizations: No  ? Attends Archivist Meetings: Never  ? Marital  Status: Divorced  ? ? ?Tobacco Counseling ?Counseling given: Not Answered ? ? ?Clinical Intake: ? ?Pre-visit preparation completed: Yes ? ?Pain : No/denies pain ? ?  ? ?Nutritional Risks: None ?Diabetes: Yes ?CBG done?: No ?Did pt. bring in CBG monitor from home?: No ? ?How often do you need to have someone help you when you read instructions, pamphlets, or other written materials from your doctor or pharmacy?: 1 - Never ?What is the last grade level you completed in school?: 10 grade ? ?Diabetic?yes ?Nutrition Risk Assessment: ? ?Has the patient had any N/V/D within the last 2 months?  No  ?Does the patient have any  non-healing wounds?  No  ?Has the patient had any unintentional weight loss or weight gain?  No  ? ?Diabetes: ? ?Is the patient diabetic?  Yes  ?If diabetic, was a CBG obtained today?  No  ?Did the patient bring in their glucometer from home?  No  ?How often do you monitor your CBG's? 2 x day .  ? ?Financial Strains and Diabetes Management: ? ?Are you having any financial strains with the device, your supplies or your medication? No .  ?Does the patient want to be seen by Chronic Care Management for management of their diabetes?  No  ?Would the patient like to be referred to a Nutritionist or for Diabetic Management?  No  ? ?Diabetic Exams: ? ?Diabetic Eye Exam: Completed 05/2021 ?Diabetic Foot Exam: Overdue, Pt has been advised about the importance in completing this exam. Pt is scheduled for diabetic foot exam on next office visit .  ? ?Interpreter Needed?: No ? ?Information entered by :: [email or web address],ORT ? ? ?Activities of Daily Living ? ?  09/22/2021  ? 11:34 AM  ?In your present state of health, do you have any difficulty performing the following activities:  ?Hearing? 0  ?Vision? 0  ?Difficulty concentrating or making decisions? 0  ?Walking or climbing stairs? 0  ?Dressing or bathing? 0  ?Doing errands, shopping? 0  ?Preparing Food and eating ? N  ?Using the Toilet? N  ?In the past six months, have you  accidently leaked urine? N  ?Do you have problems with loss of bowel control? N  ?Managing your Medications? N  ?Managing your Finances? N  ?Housekeeping or managing your Housekeeping? N  ? ? ?Patient C

## 2021-09-28 ENCOUNTER — Other Ambulatory Visit: Payer: Self-pay | Admitting: Family Medicine

## 2021-09-28 DIAGNOSIS — Z780 Asymptomatic menopausal state: Secondary | ICD-10-CM

## 2021-09-29 ENCOUNTER — Other Ambulatory Visit: Payer: Self-pay | Admitting: Family Medicine

## 2021-09-29 DIAGNOSIS — K219 Gastro-esophageal reflux disease without esophagitis: Secondary | ICD-10-CM

## 2021-10-12 ENCOUNTER — Institutional Professional Consult (permissible substitution): Payer: Medicare Other | Admitting: Neurology

## 2021-10-26 ENCOUNTER — Encounter: Payer: Self-pay | Admitting: Family

## 2021-10-26 ENCOUNTER — Ambulatory Visit (INDEPENDENT_AMBULATORY_CARE_PROVIDER_SITE_OTHER): Payer: Medicare Other | Admitting: Family

## 2021-10-26 VITALS — BP 124/70 | HR 91 | Temp 96.6°F | Resp 18 | Ht 67.0 in | Wt 225.8 lb

## 2021-10-26 DIAGNOSIS — I10 Essential (primary) hypertension: Secondary | ICD-10-CM

## 2021-10-26 DIAGNOSIS — K219 Gastro-esophageal reflux disease without esophagitis: Secondary | ICD-10-CM

## 2021-10-26 DIAGNOSIS — E785 Hyperlipidemia, unspecified: Secondary | ICD-10-CM | POA: Diagnosis not present

## 2021-10-26 DIAGNOSIS — D509 Iron deficiency anemia, unspecified: Secondary | ICD-10-CM | POA: Diagnosis not present

## 2021-10-26 DIAGNOSIS — Z7689 Persons encountering health services in other specified circumstances: Secondary | ICD-10-CM

## 2021-10-26 DIAGNOSIS — E1140 Type 2 diabetes mellitus with diabetic neuropathy, unspecified: Secondary | ICD-10-CM | POA: Diagnosis not present

## 2021-10-26 DIAGNOSIS — Z794 Long term (current) use of insulin: Secondary | ICD-10-CM

## 2021-10-26 DIAGNOSIS — Z8673 Personal history of transient ischemic attack (TIA), and cerebral infarction without residual deficits: Secondary | ICD-10-CM

## 2021-10-26 DIAGNOSIS — E113593 Type 2 diabetes mellitus with proliferative diabetic retinopathy without macular edema, bilateral: Secondary | ICD-10-CM

## 2021-10-26 MED ORDER — ONETOUCH ULTRA VI STRP: ORAL_STRIP | 1 refills | Status: DC

## 2021-10-26 MED ORDER — CLOPIDOGREL BISULFATE 75 MG PO TABS: ORAL_TABLET | ORAL | 1 refills | Status: DC

## 2021-10-26 MED ORDER — AMLODIPINE BESYLATE 5 MG PO TABS: ORAL_TABLET | ORAL | 1 refills | Status: DC

## 2021-10-26 NOTE — Assessment & Plan Note (Addendum)
B/p well controlled  -- Dietary modification and exercise at least 3 times per week for 30 minutes advised. -  Advised to check Blood pressure at home and record on log provided and notify provider if B/p > 140/90

## 2021-10-26 NOTE — Assessment & Plan Note (Signed)
No signs of bleeding reported Continue on ferrous sulfate

## 2021-10-26 NOTE — Assessment & Plan Note (Signed)
No residual Ambulates with a cane Continue on Plavix and atorvastatin -Continue to control high risk factors

## 2021-10-26 NOTE — Progress Notes (Signed)
Provider: Marlowe Sax FNP-C   Danah Reinecke, Nelda Bucks, NP  Patient Care Team: Caryssa Elzey, Nelda Bucks, NP as PCP - General (Family Medicine)  Extended Emergency Contact Information Primary Emergency Contact: Leia Alf Mobile Phone: [phone] Relation: Daughter Secondary Emergency Contact: Rushie Goltz Mobile Phone: [phone] Relation: Daughter  Code Status:  Full Code  Goals of care: Advanced Directive information    10/26/2021    2:13 PM  Advanced Directives  Does Patient Have a Medical Advance Directive? No  Would patient like information on creating a medical advance directive? No - Patient declined     Chief Complaint  Patient presents with   Establish Care    New Patient.     HPI:  Pt is a 76 y.o. female seen today for establish care here at [facility] for medical management of chronic diseases. has medical history of hypertension, type 2 diabetes mellitus with diabetic neuropathy with long-term current use of insulin, chronic kidney disease stage IIIb, hyperlipidemia, iron deficiency anemia, history of [place] vascular accident, GERD among other conditions.  Type 2 DM - goes up and down depending on what she eats.Morning CBG 140's -200's and afternoon 300's. On lantus 40 units and Humalog 15 units with meals states lost a sliding scale paper. Sometimes has tingling on the feet on Gabapentin as needed.  Has not followed up with a podiatrist initially get her toes done by the beautician.  Hypertension - takes Amlodipine 5 mg daily B/p runs in the 120's/70's -130's/80's.denies any headache,dizziness,vision changes,fatigue,chest tightness,palpitation,chest pain or shortness of breath.   Hx of stroke - Had stroke 3 yrs ago.on Plavix and atorvastatin.uses a cane     Hyperlipidemia - on Atorvastatin.does not eat deep fried foods.uses Air fry.also includes vegetables in  the diet.Does not exercise routine but goes up and down the stairs.   Memory loss -  Forgets little things.on prevagen   Anemia - on Ferrous sulfate daily.   GERD - on Cimetidine 300 mg tablet.denies any dark or blood in the stool.      Past Medical History:  Diagnosis Date   Diabetes mellitus without complication ([place])    Diabetic retinopathy ([place])    PDR OU   Hypertension    Hypertensive retinopathy    OU   Stroke [facility]) 2019   Past Surgical History:  Procedure Laterality Date   CATARACT EXTRACTION Bilateral    COLONOSCOPY     [facility] possibly but not sure. Michela Pitcher it was negative   ESOPHAGOGASTRODUODENOSCOPY     around the same time said it was negative    EYE SURGERY Bilateral    Cat Sx OU   GALLBLADDER SURGERY      Allergies  Allergen Reactions   Penicillins     Michela Pitcher she was young had a reaction    Sulfa Antibiotics Other (See Comments)    Pt unable to report reaction Said she had a rash    Allergies as of 10/26/2021       Reactions   Penicillins    Michela Pitcher she was young had a reaction    Sulfa Antibiotics Other (See Comments)   Pt unable to report reaction Said she had a rash        Medication List        Accurate as of Oct 26, 2021  4:29 PM. If you have any questions, ask your nurse or doctor.          STOP taking these medications  accu-chek soft touch lancets Stopped by: Nelda Bucks Hughie Melroy, NP   lisinopril 10 MG tablet Commonly known as: ZESTRIL Stopped by: Sandrea Hughs, NP       TAKE these medications    acetaminophen 500 MG tablet Commonly known as: TYLENOL Take 500 mg by mouth in the morning and at bedtime.   amLODipine 5 MG tablet Commonly known as: NORVASC TAKE 1 TABLET(5 MG) BY MOUTH DAILY   atorvastatin 40 MG tablet Commonly known as: LIPITOR TAKE 1 TABLET(40 MG) BY MOUTH DAILY   BD Pen Needle Nano 2nd Gen 32G X 4 MM Misc Generic drug: Insulin Pen Needle USE WITH LANTUS DAILY   cimetidine 300 MG tablet Commonly known as: TAGAMET TAKE 1 TABLET(300 MG) BY MOUTH AT BEDTIME   clopidogrel  75 MG tablet Commonly known as: PLAVIX TAKE 1 TABLET(75 MG) BY MOUTH DAILY   ferrous sulfate 325 (65 FE) MG tablet Take 1 tablet (325 mg total) by mouth daily with breakfast.   gabapentin 300 MG capsule Commonly known as: NEURONTIN TAKE 1 CAPSULE BY MOUTH AT NIGHT FOR 1 WEEK. INCREASE TO 1 CAPSULE 2 TIMES DAILY AS TOLERATED   insulin glargine 100 UNIT/ML Solostar Pen Commonly known as: LANTUS Inject 40 Units into the skin at bedtime. What changed: Another medication with the same name was removed. Continue taking this medication, and follow the directions you see here. Changed by: Sandrea Hughs, NP   insulin lispro 100 UNIT/ML KwikPen Commonly known as: HumaLOG KwikPen ADMINISTER 0 TO 15 UNITS UNDER THE SKIN INTO SKIN THREE TIMES DAILY   INSULIN SYRINGE 1CC/31GX5/16" 31G X 5/16" 1 ML Misc USE AS DIRECTED WITH LANTUS   MULTIVITAMIN ADULT PO Take 1 tablet by mouth daily.   NEEDLE (DISP) 30 G 30G X 1" Misc Commonly known as: B-D DISP NEEDLE 30GX1" To use with Lantus.   ONE TOUCH ULTRA 2 w/Device Kit by Does not apply route. What changed: Another medication with the same name was removed. Continue taking this medication, and follow the directions you see here. Changed by: Sandrea Hughs, NP   OneTouch Ultra test strip Generic drug: glucose blood USE TO TEST UP TO FOUR TIMES DAILY   PREVAGEN PO Take 50 mcg by mouth daily.   REFRESH DRY EYE THERAPY OP Place 1-2 drops into both eyes as needed (for dryness).   VITAMIN D PO Take 50 mg by mouth daily.        Review of Systems  Constitutional:  Negative for appetite change, chills, fatigue, fever and unexpected weight change.  HENT:  Negative for congestion, dental problem, ear discharge, ear pain, facial swelling, hearing loss, nosebleeds, postnasal drip, rhinorrhea, sinus pressure, sinus pain, sneezing, sore throat, tinnitus and trouble swallowing.   Eyes:  Positive for visual disturbance. Negative for pain,  discharge, redness and itching.       Wears eye glasses   Respiratory:  Negative for cough, chest tightness, shortness of breath and wheezing.   Cardiovascular:  Negative for chest pain, palpitations and leg swelling.  Gastrointestinal:  Negative for abdominal distention, abdominal pain, blood in stool, constipation, diarrhea, nausea and vomiting.       GERD controlled on cimetidine   Endocrine: Negative for cold intolerance, heat intolerance, polydipsia, polyphagia and polyuria.  Genitourinary:  Negative for difficulty urinating, dysuria, flank pain, frequency and urgency.       Wakes up 2-3 times at night to void  Musculoskeletal:  Positive for gait problem. Negative for arthralgias, back pain, joint  swelling, myalgias, neck pain and neck stiffness.  Skin:  Negative for color change, pallor, rash and wound.  Neurological:  Negative for dizziness, syncope, speech difficulty, weakness, light-headedness and headaches.       Tingling on feet   Hematological:  Does not bruise/bleed easily.  Psychiatric/Behavioral:  Negative for agitation, behavioral problems, confusion, hallucinations, self-injury, sleep disturbance and suicidal ideas. The patient is not nervous/anxious.        Forgetful at night   Immunization History  Administered Date(s) Administered   Influenza, High Dose Seasonal PF 03/07/2018   Influenza,inj,Quad PF,6+ Mos 01/31/2019   PFIZER(Purple Top)SARS-COV-2 Vaccination 10/03/2019, 10/29/2019   Pertinent  Health Maintenance Due  Topic Date Due   DEXA SCAN  Never done   MAMMOGRAM  01/30/2021   URINE MICROALBUMIN  07/10/2021   HEMOGLOBIN A1C  11/30/2021   OPHTHALMOLOGY EXAM  06/20/2022   FOOT EXAM  10/27/2022   COLONOSCOPY (Pts 45-49yr Insurance coverage will need to be confirmed)  03/31/2030   INFLUENZA VACCINE  Discontinued      06/01/2021    3:41 PM 07/23/2021    9:35 PM 08/10/2021   10:20 AM 09/22/2021   11:39 AM 10/26/2021    2:13 PM  Fall Risk  Falls in the past  year? 0  0 0 0  Was there an injury with Fall?    0 0  Fall Risk Category Calculator    0 0  Fall Risk Category    Low Low  Patient Fall Risk Level Low fall risk Low fall risk Low fall risk Low fall risk Low fall risk  Patient at Risk for Falls Due to     No Fall Risks  Fall risk Follow up    Falls evaluation completed Falls evaluation completed   Functional Status Survey:    Vitals:   10/26/21 1401  BP: 124/70  Pulse: 91  Resp: 18  Temp: (!) 96.6 F (35.9 C)  SpO2: 97%  Weight: 225 lb 12.8 oz (102.4 kg)  Height: '5\' 7"'  (1.702 m)   Body mass index is 35.37 kg/m. Physical Exam Vitals reviewed.  Constitutional:      General: She is not in acute distress.    Appearance: Normal appearance. She is normal weight. She is not ill-appearing or diaphoretic.  HENT:     Head: Normocephalic.     Right Ear: Tympanic membrane, ear canal and external ear normal. There is no impacted cerumen.     Left Ear: Tympanic membrane, ear canal and external ear normal. There is no impacted cerumen.     Nose: Nose normal. No congestion or rhinorrhea.     Mouth/Throat:     Mouth: Mucous membranes are moist.     Pharynx: Oropharynx is clear. No oropharyngeal exudate or posterior oropharyngeal erythema.  Eyes:     General: No scleral icterus.       Right eye: No discharge.        Left eye: No discharge.     Extraocular Movements: Extraocular movements intact.     Conjunctiva/sclera: Conjunctivae normal.     Pupils: Pupils are equal, round, and reactive to light.  Neck:     Vascular: No carotid bruit.  Cardiovascular:     Rate and Rhythm: Normal rate and regular rhythm.     Pulses: Normal pulses.     Heart sounds: Normal heart sounds. No murmur heard.   No friction rub. No gallop.  Pulmonary:     Effort: Pulmonary effort is normal. No respiratory  distress.     Breath sounds: Normal breath sounds. No wheezing, rhonchi or rales.  Chest:     Chest wall: No tenderness.  Abdominal:     General:  Bowel sounds are normal. There is no distension.     Palpations: Abdomen is soft. There is no mass.     Tenderness: There is no abdominal tenderness. There is no right CVA tenderness, left CVA tenderness, guarding or rebound.  Musculoskeletal:        General: No swelling or tenderness. Normal range of motion.     Cervical back: Normal range of motion. No rigidity or tenderness.     Right lower leg: No edema.     Left lower leg: No edema.  Lymphadenopathy:     Cervical: No cervical adenopathy.  Skin:    General: Skin is warm and dry.     Coloration: Skin is not pale.     Findings: No bruising, erythema, lesion or rash.  Neurological:     Mental Status: She is alert and oriented to person, place, and time.     Cranial Nerves: No cranial nerve deficit.     Sensory: Sensory deficit present.     Motor: No weakness.     Coordination: Coordination normal.     Gait: Gait abnormal.     Comments: Monofilament sensation decreased both feet  Psychiatric:        Mood and Affect: Mood normal.        Speech: Speech normal.        Behavior: Behavior normal.        Thought Content: Thought content normal.        Judgment: Judgment normal.    Labs reviewed: Recent Labs    06/02/21 1309 07/23/21 2220 08/10/21 1055  NA 137 135 138  K 3.9 3.5 3.6  CL 103 101 104  CO2 '27 23 26  ' GLUCOSE 208* 265* 100*  BUN '20 19 19  ' CREATININE 1.39* 1.19* 1.04  CALCIUM 9.0 9.1 9.3   Recent Labs    07/23/21 2220  AST 20  ALT 19  ALKPHOS 91  BILITOT 0.1*  PROT 7.8  ALBUMIN 3.4*   Recent Labs    01/19/21 1137 06/02/21 1309 07/23/21 2220  WBC 6.3 8.2 8.6  HGB 10.5* 9.7* 10.6*  HCT 30.6* 29.3* 31.5*  MCV 84.8 86.6 85.1  PLT 392.0 436.0* 414*   Lab Results  Component Value Date   TSH 1.22 08/02/2018   Lab Results  Component Value Date   HGBA1C 8.7 (H) 06/02/2021   Lab Results  Component Value Date   CHOL 111 08/10/2021   HDL 34.40 (L) 08/10/2021   LDLCALC 56 08/10/2021   LDLDIRECT  54.0 08/10/2021   TRIG 102.0 08/10/2021   CHOLHDL 3 08/10/2021    Significant Diagnostic Results in last 30 days:  No results found.  Assessment/Plan  Problem List Items Addressed This Visit       Cardiovascular and Mediastinum   Essential hypertension    B/p well controlled  -- Dietary modification and exercise at least 3 times per week for 30 minutes advised. -  Advised to check Blood pressure at home and record on log provided and notify provider if B/p > 140/90         Relevant Medications   amLODipine (NORVASC) 5 MG tablet   Other Relevant Orders   TSH   CBC with Differential/Platelet   COMPLETE METABOLIC PANEL WITH GFR     Digestive  Gastroesophageal reflux disease    Symptoms controlled. H/H stable.No tarry or black stool  - advised to avoid eating meals late in the evening and to avoid aggravating foods and spices. - continue on cimetidine          Endocrine   Type 2 diabetes mellitus with diabetic neuropathy, with long-term current use of insulin (HCC)    Uncontrolled. - Dietary modification and exercise at least 3 times per week for 30 minutes advised. - will recheck A1C then adjust medication  -continue on Lantus and Humalog        Relevant Medications   insulin glargine (LANTUS) 100 UNIT/ML Solostar Pen   Other Relevant Orders   Hemoglobin A1c   Ambulatory referral to Podiatry   Urine microalbumin-creatinine with uACR   Proliferative diabetic retinopathy of both eyes without macular edema associated with type 2 diabetes mellitus ([place])    Continue to follow-up with ophthalmology      Relevant Medications   insulin glargine (LANTUS) 100 UNIT/ML Solostar Pen     Other   Iron deficiency anemia    No signs of bleeding reported Continue on ferrous sulfate       Hyperlipidemia LDL goal <70   Relevant Medications   amLODipine (NORVASC) 5 MG tablet   Other Relevant Orders   Lipid panel   History of CVA (cerebrovascular accident)    No  residual Ambulates with a cane Continue on Plavix and atorvastatin -Continue to control high risk factors       Relevant Medications   clopidogrel (PLAVIX) 75 MG tablet   Encounter to establish care - Primary    Available records reviewed, recommended obtaining records from previous provider then update immunization.  Recommended scheduling for fasting blood work.       Family/ staff Communication: Reviewed plan of care with patient verbalized understanding  Labs/tests ordered:  - CBC with Differential/Platelet - CMP with eGFR([facility]) - TSH - Hgb A1C - Lipid panel  Next Appointment : Return in about 6 months (around 04/28/2022) for medical management of chronic issues.fasting labs in one week .  Sandrea Hughs, NP

## 2021-10-26 NOTE — Assessment & Plan Note (Addendum)
Uncontrolled. - Dietary modification and exercise at least 3 times per week for 30 minutes advised. - will recheck A1C then adjust medication  -continue on Lantus and Humalog

## 2021-10-26 NOTE — Assessment & Plan Note (Signed)
Available records reviewed, recommended obtaining records from previous provider then update immunization.  Recommended scheduling for fasting blood work.

## 2021-10-26 NOTE — Assessment & Plan Note (Signed)
Continue to follow up with ophthalmology.

## 2021-10-26 NOTE — Assessment & Plan Note (Signed)
Symptoms controlled. H/H stable.No tarry or black stool  - advised to avoid eating meals late in the evening and to avoid aggravating foods and spices. - continue on cimetidine

## 2021-11-03 ENCOUNTER — Other Ambulatory Visit: Payer: Medicare Other

## 2021-11-03 DIAGNOSIS — I10 Essential (primary) hypertension: Secondary | ICD-10-CM

## 2021-11-03 DIAGNOSIS — E785 Hyperlipidemia, unspecified: Secondary | ICD-10-CM | POA: Diagnosis not present

## 2021-11-03 DIAGNOSIS — Z794 Long term (current) use of insulin: Secondary | ICD-10-CM | POA: Diagnosis not present

## 2021-11-03 DIAGNOSIS — E1140 Type 2 diabetes mellitus with diabetic neuropathy, unspecified: Secondary | ICD-10-CM

## 2021-11-04 LAB — LIPID PANEL
Cholesterol: 134 mg/dL (ref <200)
HDL: 46 mg/dL — ABNORMAL LOW (ref >=50)
LDL Cholesterol (Calc): 71 mg/dL (calc)
Non-HDL Cholesterol (Calc): 88 mg/dL (calc) (ref <130)
Total CHOL/HDL Ratio: 2.9 (calc) (ref <5.0)
Triglycerides: 87 mg/dL (ref <150)

## 2021-11-04 LAB — CBC WITH DIFFERENTIAL/PLATELET
Absolute Monocytes: 605 cells/uL (ref 200–950)
Basophils Absolute: 50 cells/uL (ref 0–200)
Basophils Relative: 0.9 %
Eosinophils Absolute: 193 cells/uL (ref 15–500)
Eosinophils Relative: 3.5 %
HCT: 30.8 % — ABNORMAL LOW (ref 35.0–45.0)
Hemoglobin: 10.2 g/dL — ABNORMAL LOW (ref 11.7–15.5)
Lymphs Abs: 2002 cells/uL (ref 850–3900)
MCH: 28.6 pg (ref 27.0–33.0)
MCHC: 33.1 g/dL (ref 32.0–36.0)
MCV: 86.3 fL (ref 80.0–100.0)
MPV: 9.5 fL (ref 7.5–12.5)
Monocytes Relative: 11 %
Neutro Abs: 2651 cells/uL (ref 1500–7800)
Neutrophils Relative %: 48.2 %
Platelets: 448 10*3/uL — ABNORMAL HIGH (ref 140–400)
RBC: 3.57 10*6/uL — ABNORMAL LOW (ref 3.80–5.10)
RDW: 13.3 % (ref 11.0–15.0)
Total Lymphocyte: 36.4 %
WBC: 5.5 10*3/uL (ref 3.8–10.8)

## 2021-11-04 LAB — COMPLETE METABOLIC PANEL WITH GFR
AG Ratio: 1 (calc) (ref 1.0–2.5)
ALT: 14 U/L (ref 6–29)
AST: 20 U/L (ref 10–35)
Albumin: 3.8 g/dL (ref 3.6–5.1)
Alkaline phosphatase (APISO): 89 U/L (ref 37–153)
BUN/Creatinine Ratio: 19 (calc) (ref 6–22)
BUN: 22 mg/dL (ref 7–25)
CO2: 23 mmol/L (ref 20–32)
Calcium: 9.4 mg/dL (ref 8.6–10.4)
Chloride: 106 mmol/L (ref 98–110)
Creat: 1.14 mg/dL — ABNORMAL HIGH (ref 0.60–1.00)
Globulin: 3.8 g/dL (calc) — ABNORMAL HIGH (ref 1.9–3.7)
Glucose, Bld: 133 mg/dL — ABNORMAL HIGH (ref 65–99)
Potassium: 4.3 mmol/L (ref 3.5–5.3)
Sodium: 139 mmol/L (ref 135–146)
Total Bilirubin: 0.3 mg/dL (ref 0.2–1.2)
Total Protein: 7.6 g/dL (ref 6.1–8.1)
eGFR: 51 mL/min/{1.73_m2} — ABNORMAL LOW (ref >=60)

## 2021-11-04 LAB — HEMOGLOBIN A1C
Hgb A1c MFr Bld: 9.5 % of total Hgb — ABNORMAL HIGH (ref <5.7)
Mean Plasma Glucose: 226 mg/dL
eAG (mmol/L): 12.5 mmol/L

## 2021-11-04 LAB — MICROALBUMIN / CREATININE URINE RATIO
Creatinine, Urine: 58 mg/dL (ref 20–275)
Microalb Creat Ratio: 57 mcg/mg creat — ABNORMAL HIGH (ref <30)
Microalb, Ur: 3.3 mg/dL

## 2021-11-04 LAB — TSH: TSH: 1.29 mIU/L (ref 0.40–4.50)

## 2021-11-11 ENCOUNTER — Telehealth: Payer: Self-pay | Admitting: *Deleted

## 2021-11-11 DIAGNOSIS — E785 Hyperlipidemia, unspecified: Secondary | ICD-10-CM

## 2021-11-11 DIAGNOSIS — D582 Other hemoglobinopathies: Secondary | ICD-10-CM

## 2021-11-11 DIAGNOSIS — I10 Essential (primary) hypertension: Secondary | ICD-10-CM

## 2021-11-11 DIAGNOSIS — D509 Iron deficiency anemia, unspecified: Secondary | ICD-10-CM

## 2021-11-11 DIAGNOSIS — E1140 Type 2 diabetes mellitus with diabetic neuropathy, unspecified: Secondary | ICD-10-CM

## 2021-11-11 MED ORDER — INSULIN GLARGINE 100 UNIT/ML SOLOSTAR PEN: 42.0000 [IU] | PEN_INJECTOR | Freq: Every day | SUBCUTANEOUS | 0 refills | Status: DC

## 2021-11-11 NOTE — Telephone Encounter (Signed)
-----   Message from Caesar Bookman, NP sent at 11/10/2021  5:19 PM EDT ----- - Kidney function slightly high at baseline indicates chronic kidney disease stage 3 a.  Avoid anti-inflammatory medications such as ibuprofen, Aleve, naproxen and others.  -Hemoglobin A1c is slightly high compared to previous.  Recommend increasing Lantus from 40 units to 42 units SQ daily at bedtime.  -Hemoglobin level also slightly low but stable compared to previous.  Recommend obtaining stool for fecal occult blood test ( FOBT) to rule out internal bleeding.  -Repeat BMP, A1c and CBC in 4 months

## 2021-11-16 ENCOUNTER — Other Ambulatory Visit: Payer: Self-pay | Admitting: Family

## 2021-11-23 LAB — HOUSE ACCOUNT TRACKING

## 2021-11-23 LAB — FECAL GLOBIN BY IMMUNOCHEMISTRY
FECAL GLOBIN RESULT:: NOT DETECTED
MICRO NUMBER:: 13553610
SPECIMEN QUALITY:: ADEQUATE

## 2021-11-24 ENCOUNTER — Other Ambulatory Visit: Payer: Self-pay

## 2021-11-24 DIAGNOSIS — I10 Essential (primary) hypertension: Secondary | ICD-10-CM

## 2021-11-24 DIAGNOSIS — E1140 Type 2 diabetes mellitus with diabetic neuropathy, unspecified: Secondary | ICD-10-CM

## 2021-11-24 DIAGNOSIS — Z8673 Personal history of transient ischemic attack (TIA), and cerebral infarction without residual deficits: Secondary | ICD-10-CM

## 2021-11-24 MED ORDER — CLOPIDOGREL BISULFATE 75 MG PO TABS: ORAL_TABLET | ORAL | 1 refills | Status: DC

## 2021-11-24 MED ORDER — AMLODIPINE BESYLATE 5 MG PO TABS: ORAL_TABLET | ORAL | 1 refills | Status: DC

## 2021-11-24 MED ORDER — INSULIN GLARGINE 100 UNIT/ML SOLOSTAR PEN: 42.0000 [IU] | PEN_INJECTOR | Freq: Every day | SUBCUTANEOUS | 0 refills | Status: DC

## 2021-11-25 ENCOUNTER — Encounter: Payer: Self-pay | Admitting: Family Medicine

## 2021-11-26 LAB — FECAL GLOBIN BY IMMUNOCHEMISTRY

## 2021-12-19 ENCOUNTER — Other Ambulatory Visit: Payer: Self-pay | Admitting: Family Medicine

## 2021-12-19 DIAGNOSIS — I959 Hypotension, unspecified: Secondary | ICD-10-CM

## 2022-01-10 ENCOUNTER — Other Ambulatory Visit: Payer: Self-pay | Admitting: Family

## 2022-01-10 DIAGNOSIS — E1140 Type 2 diabetes mellitus with diabetic neuropathy, unspecified: Secondary | ICD-10-CM

## 2022-03-10 ENCOUNTER — Other Ambulatory Visit: Payer: Self-pay

## 2022-03-10 DIAGNOSIS — E1140 Type 2 diabetes mellitus with diabetic neuropathy, unspecified: Secondary | ICD-10-CM

## 2022-03-10 DIAGNOSIS — D582 Other hemoglobinopathies: Secondary | ICD-10-CM

## 2022-03-10 DIAGNOSIS — D509 Iron deficiency anemia, unspecified: Secondary | ICD-10-CM

## 2022-03-10 DIAGNOSIS — E785 Hyperlipidemia, unspecified: Secondary | ICD-10-CM

## 2022-03-10 DIAGNOSIS — I10 Essential (primary) hypertension: Secondary | ICD-10-CM

## 2022-03-21 ENCOUNTER — Inpatient Hospital Stay: Admission: RE | Admit: 2022-03-21 | Payer: Medicare Other | Source: Ambulatory Visit

## 2022-03-21 ENCOUNTER — Ambulatory Visit: Payer: Medicare Other

## 2022-03-23 ENCOUNTER — Other Ambulatory Visit: Payer: Medicare Other

## 2022-03-23 DIAGNOSIS — E1140 Type 2 diabetes mellitus with diabetic neuropathy, unspecified: Secondary | ICD-10-CM | POA: Diagnosis not present

## 2022-03-23 DIAGNOSIS — I10 Essential (primary) hypertension: Secondary | ICD-10-CM | POA: Diagnosis not present

## 2022-03-23 DIAGNOSIS — D582 Other hemoglobinopathies: Secondary | ICD-10-CM | POA: Diagnosis not present

## 2022-03-23 DIAGNOSIS — D509 Iron deficiency anemia, unspecified: Secondary | ICD-10-CM | POA: Diagnosis not present

## 2022-03-23 DIAGNOSIS — E785 Hyperlipidemia, unspecified: Secondary | ICD-10-CM | POA: Diagnosis not present

## 2022-03-24 ENCOUNTER — Other Ambulatory Visit: Payer: Medicare Other

## 2022-03-24 LAB — BASIC METABOLIC PANEL
BUN/Creatinine Ratio: 19 (calc) (ref 6–22)
BUN: 25 mg/dL (ref 7–25)
CO2: 26 mmol/L (ref 20–32)
Calcium: 9.4 mg/dL (ref 8.6–10.4)
Chloride: 104 mmol/L (ref 98–110)
Creat: 1.32 mg/dL — ABNORMAL HIGH (ref 0.60–1.00)
Glucose, Bld: 206 mg/dL — ABNORMAL HIGH (ref 65–99)
Potassium: 4 mmol/L (ref 3.5–5.3)
Sodium: 139 mmol/L (ref 135–146)

## 2022-03-24 LAB — CBC WITH DIFFERENTIAL/PLATELET
Absolute Monocytes: 603 cells/uL (ref 200–950)
Basophils Absolute: 41 cells/uL (ref 0–200)
Basophils Relative: 0.7 %
Eosinophils Absolute: 157 cells/uL (ref 15–500)
Eosinophils Relative: 2.7 %
HCT: 30.7 % — ABNORMAL LOW (ref 35.0–45.0)
Hemoglobin: 10.2 g/dL — ABNORMAL LOW (ref 11.7–15.5)
Lymphs Abs: 2227 cells/uL (ref 850–3900)
MCH: 28.1 pg (ref 27.0–33.0)
MCHC: 33.2 g/dL (ref 32.0–36.0)
MCV: 84.6 fL (ref 80.0–100.0)
MPV: 9.3 fL (ref 7.5–12.5)
Monocytes Relative: 10.4 %
Neutro Abs: 2772 cells/uL (ref 1500–7800)
Neutrophils Relative %: 47.8 %
Platelets: 405 10*3/uL — ABNORMAL HIGH (ref 140–400)
RBC: 3.63 10*6/uL — ABNORMAL LOW (ref 3.80–5.10)
RDW: 13 % (ref 11.0–15.0)
Total Lymphocyte: 38.4 %
WBC: 5.8 10*3/uL (ref 3.8–10.8)

## 2022-03-24 LAB — HEMOGLOBIN A1C W/OUT EAG: Hgb A1c MFr Bld: 9.2 % of total Hgb — ABNORMAL HIGH (ref <5.7)

## 2022-03-25 ENCOUNTER — Other Ambulatory Visit: Payer: Self-pay

## 2022-03-25 DIAGNOSIS — E1140 Type 2 diabetes mellitus with diabetic neuropathy, unspecified: Secondary | ICD-10-CM

## 2022-03-25 MED ORDER — INSULIN LISPRO (1 UNIT DIAL) 100 UNIT/ML (KWIKPEN): PEN_INJECTOR | SUBCUTANEOUS | 4 refills | Status: DC

## 2022-03-25 MED ORDER — LANTUS SOLOSTAR 100 UNIT/ML ~~LOC~~ SOPN: 44.0000 [IU] | PEN_INJECTOR | Freq: Every day | SUBCUTANEOUS | 2 refills | Status: DC

## 2022-04-14 ENCOUNTER — Other Ambulatory Visit: Payer: Self-pay | Admitting: Family

## 2022-04-15 ENCOUNTER — Other Ambulatory Visit: Payer: Self-pay | Admitting: Family Medicine

## 2022-04-15 DIAGNOSIS — Z1231 Encounter for screening mammogram for malignant neoplasm of breast: Secondary | ICD-10-CM

## 2022-04-20 ENCOUNTER — Other Ambulatory Visit: Payer: Self-pay | Admitting: Family

## 2022-04-20 MED ORDER — BD PEN NEEDLE NANO 2ND GEN 32G X 4 MM MISC: 1.0000 | Freq: Four times a day (QID) | 1 refills | Status: DC

## 2022-04-20 NOTE — Addendum Note (Signed)
Addended by: Maurice Small on: 04/20/2022 03:05 PM   Modules accepted: Orders

## 2022-04-20 NOTE — Telephone Encounter (Addendum)
Fax received from pharmacy stating rx for B-D Nano needs specific instructions, the insurance companies will not process a rx with the directions use as directed   I made an edit to the rx sent to reflect specific instructions

## 2022-04-28 ENCOUNTER — Encounter: Payer: Self-pay | Admitting: Family

## 2022-04-28 ENCOUNTER — Ambulatory Visit (INDEPENDENT_AMBULATORY_CARE_PROVIDER_SITE_OTHER): Payer: Medicare Other | Admitting: Family

## 2022-04-28 VITALS — BP 130/68 | HR 62 | Temp 97.6°F | Resp 17 | Ht 67.0 in | Wt 220.2 lb

## 2022-04-28 DIAGNOSIS — E785 Hyperlipidemia, unspecified: Secondary | ICD-10-CM

## 2022-04-28 DIAGNOSIS — Z794 Long term (current) use of insulin: Secondary | ICD-10-CM | POA: Diagnosis not present

## 2022-04-28 DIAGNOSIS — E1140 Type 2 diabetes mellitus with diabetic neuropathy, unspecified: Secondary | ICD-10-CM | POA: Diagnosis not present

## 2022-04-28 DIAGNOSIS — D509 Iron deficiency anemia, unspecified: Secondary | ICD-10-CM | POA: Diagnosis not present

## 2022-04-28 DIAGNOSIS — I10 Essential (primary) hypertension: Secondary | ICD-10-CM | POA: Diagnosis not present

## 2022-04-28 DIAGNOSIS — B3789 Other sites of candidiasis: Secondary | ICD-10-CM

## 2022-04-28 MED ORDER — NYSTATIN 100000 UNIT/GM EX CREA: 1.0000 | TOPICAL_CREAM | Freq: Two times a day (BID) | CUTANEOUS | 3 refills | Status: DC

## 2022-04-28 MED ORDER — FERROUS SULFATE 325 (65 FE) MG PO TABS: 325.0000 mg | ORAL_TABLET | Freq: Two times a day (BID) | ORAL | 1 refills | Status: AC

## 2022-04-28 MED ORDER — BD PEN NEEDLE NANO 2ND GEN 32G X 4 MM MISC: 1.0000 | Freq: Four times a day (QID) | 1 refills | Status: DC

## 2022-04-28 MED ORDER — DOCUSATE SODIUM 100 MG PO CAPS: 100.0000 mg | ORAL_CAPSULE | Freq: Every day | ORAL | 0 refills | Status: DC

## 2022-04-28 NOTE — Progress Notes (Signed)
Provider: Marlowe Sax FNP-C   Webster Patrone, Nelda Bucks, NP  Patient Care Team: Jacquelyn Antony, Nelda Bucks, NP as PCP - General (Family Medicine)  Extended Emergency Contact Information Primary Emergency Contact: Leia Alf Mobile Phone: [phone] Relation: Daughter Secondary Emergency Contact: Rushie Goltz Mobile Phone: [phone] Relation: Daughter  Code Status:  Full Code  Goals of care: Advanced Directive information    04/28/2022    1:15 PM  Advanced Directives  Does Patient Have a Medical Advance Directive? No  Would patient like information on creating a medical advance directive? Yes (ED - Information included in AVS)     Chief Complaint  Patient presents with   Medical Management of Chronic Issues    6 month follow-up. Discuss need for td/tdap and shingrix or post pone/exclude if patient refuses or is not a candidate. [facility] verified.    Acute Visit    Patient states she would like for you to look under her breast because she thinks her bra was too tight and got inflamed     HPI:  Pt is a 77 y.o. female seen today for 6 months follow up for medical management of chronic diseases.    Hypertension - states SBP runs in the 120'2 - 130's she denies any headache,dizziness,vision changes,fatigue,chest tightness,palpitation,chest pain or shortness of breath.  Taking medication as prescribed.   Type 2 DM - CBG runs in the 90's - 190's sometimes in the 200's if she eats something that's high in sugar. No hypoglycemia. Latest 9.2  She follows up with Opthalmology.  She does not see a podiatrist goes to a spa usually tells them she is diabetic.Does have some numbness on the toes takes Gabapentin as needed.  Sometimes use spenda instead of sugar.   Anemia - Hgb 10.2 on ferrous sulfate 325 mg   CKD - CR 1.32   Hyperlipidemia - on atorvastatin 40 mg tablet daily.she denies any muscle weakness,cramps or mylagia.   Due for Tetanus and shingles vaccine.she declines shingles vaccine  states has been offered by pharmacy but declined. She agrees to get Tdap vaccine at the pharmacy.    Past Medical History:  Diagnosis Date   Diabetes mellitus without complication ([place])    Diabetic retinopathy ([place])    PDR OU   Hypertension    Hypertensive retinopathy    OU   Stroke [facility]) 2019   Past Surgical History:  Procedure Laterality Date   CATARACT EXTRACTION Bilateral    COLONOSCOPY     [facility] possibly but not sure. Michela Pitcher it was negative   ESOPHAGOGASTRODUODENOSCOPY     around the same time said it was negative    EYE SURGERY Bilateral    Cat Sx OU   GALLBLADDER SURGERY      Allergies  Allergen Reactions   Penicillins     Michela Pitcher she was young had a reaction    Sulfa Antibiotics Other (See Comments)    Pt unable to report reaction Said she had a rash    Allergies as of 04/28/2022       Reactions   Penicillins    Michela Pitcher she was young had a reaction    Sulfa Antibiotics Other (See Comments)   Pt unable to report reaction Said she had a rash        Medication List        Accurate as of April 28, 2022  1:40 PM. If you have any questions, ask your nurse or doctor.  STOP taking these medications    NEEDLE (DISP) 30 G 30G X 1" Misc Commonly known as: B-D DISP NEEDLE 30GX1" Stopped by: Sandrea Hughs, NP       TAKE these medications    acetaminophen 500 MG tablet Commonly known as: TYLENOL Take 500 mg by mouth in the morning and at bedtime.   amLODipine 5 MG tablet Commonly known as: NORVASC TAKE 1 TABLET(5 MG) BY MOUTH DAILY   atorvastatin 40 MG tablet Commonly known as: LIPITOR TAKE 1 TABLET(40 MG) BY MOUTH DAILY   BD Pen Needle Nano 2nd Gen 32G X 4 MM Misc Generic drug: Insulin Pen Needle 1 Device by Other route 4 (four) times daily. E11.40   cimetidine 300 MG tablet Commonly known as: TAGAMET TAKE 1 TABLET(300 MG) BY MOUTH AT BEDTIME   clopidogrel 75 MG tablet Commonly known as: PLAVIX TAKE 1 TABLET(75 MG)  BY MOUTH DAILY   ferrous sulfate 325 (65 FE) MG tablet Take 1 tablet (325 mg total) by mouth daily with breakfast.   gabapentin 300 MG capsule Commonly known as: NEURONTIN TAKE 1 CAPSULE BY MOUTH AT NIGHT FOR 1 WEEK. INCREASE TO 1 CAPSULE 2 TIMES DAILY AS TOLERATED   insulin lispro 100 UNIT/ML KwikPen Commonly known as: HumaLOG KwikPen ADMINISTER 0 TO 15 UNITS UNDER THE SKIN INTO SKIN THREE TIMES DAILY   INSULIN SYRINGE 1CC/31GX5/16" 31G X 5/16" 1 ML Misc USE AS DIRECTED WITH LANTUS   Lantus SoloStar 100 UNIT/ML Solostar Pen Generic drug: insulin glargine Inject 44 Units into the skin daily.   MULTIVITAMIN ADULT PO Take 1 tablet by mouth daily.   ONE TOUCH ULTRA 2 w/Device Kit by Does not apply route.   OneTouch Ultra test strip Generic drug: glucose blood USE TO TEST UP TO FOUR TIMES DAILY   PREVAGEN PO Take 50 mcg by mouth daily.   REFRESH DRY EYE THERAPY OP Place 1-2 drops into both eyes as needed (for dryness).   VITAMIN D PO Take 50 mg by mouth daily.        Review of Systems  Constitutional:  Negative for appetite change, chills, fatigue, fever and unexpected weight change.  HENT:  Negative for congestion, dental problem, ear discharge, ear pain, facial swelling, hearing loss, nosebleeds, postnasal drip, rhinorrhea, sinus pressure, sinus pain, sneezing, sore throat, tinnitus and trouble swallowing.   Eyes:  Negative for pain, discharge, redness, itching and visual disturbance.  Respiratory:  Negative for cough, chest tightness, shortness of breath and wheezing.   Cardiovascular:  Negative for chest pain, palpitations and leg swelling.  Gastrointestinal:  Negative for abdominal distention, abdominal pain, blood in stool, constipation, diarrhea, nausea and vomiting.  Endocrine: Negative for cold intolerance, heat intolerance, polydipsia, polyphagia and polyuria.  Genitourinary:  Negative for difficulty urinating, dysuria, flank pain, frequency and urgency.   Musculoskeletal:  Positive for gait problem. Negative for arthralgias, back pain, joint swelling, myalgias, neck pain and neck stiffness.  Skin:  Negative for color change, pallor, rash and wound.  Neurological:  Negative for dizziness, syncope, speech difficulty, weakness, light-headedness, numbness and headaches.  Hematological:  Does not bruise/bleed easily.  Psychiatric/Behavioral:  Negative for agitation, behavioral problems, confusion, hallucinations, self-injury, sleep disturbance and suicidal ideas. The patient is not nervous/anxious.     Immunization History  Administered Date(s) Administered   Influenza, High Dose Seasonal PF 03/07/2018   Influenza,inj,Quad PF,6+ Mos 01/31/2019   PFIZER(Purple Top)SARS-COV-2 Vaccination 10/03/2019, 10/29/2019   Pertinent  Health Maintenance Due  Topic Date Due   DEXA  SCAN  Never done   MAMMOGRAM  01/30/2021   OPHTHALMOLOGY EXAM  06/20/2022   HEMOGLOBIN A1C  09/22/2022   FOOT EXAM  10/27/2022   COLONOSCOPY (Pts 45-33yr Insurance coverage will need to be confirmed)  03/31/2030   INFLUENZA VACCINE  Discontinued      07/23/2021    9:35 PM 08/10/2021   10:20 AM 09/22/2021   11:39 AM 10/26/2021    2:13 PM 04/28/2022    1:15 PM  Fall Risk  Falls in the past year?  0 0 0 0  Was there an injury with Fall?   0 0 0  Fall Risk Category Calculator   0 0 0  Fall Risk Category   Low Low Low  Patient Fall Risk Level Low fall risk Low fall risk Low fall risk Low fall risk   Patient at Risk for Falls Due to    No Fall Risks History of fall(s)  Fall risk Follow up   Falls evaluation completed Falls evaluation completed Falls evaluation completed   Functional Status Survey:    Vitals:   04/28/22 1305  BP: 130/68  Pulse: 62  Resp: 17  Temp: 97.6 F (36.4 C)  TempSrc: Temporal  SpO2: 100%  Weight: 220 lb 3.2 oz (99.9 kg)  Height: _0  (1.702 m)   Body mass index is 34.49 kg/m. Physical Exam Vitals reviewed.  Constitutional:       General: She is not in acute distress.    Appearance: Normal appearance. She is obese. She is not ill-appearing or diaphoretic.  HENT:     Head: Normocephalic.     Right Ear: Tympanic membrane, ear canal and external ear normal. There is no impacted cerumen.     Left Ear: Tympanic membrane, ear canal and external ear normal. There is no impacted cerumen.     Nose: Nose normal. No congestion or rhinorrhea.     Mouth/Throat:     Mouth: Mucous membranes are moist.     Pharynx: Oropharynx is clear. No oropharyngeal exudate or posterior oropharyngeal erythema.  Eyes:     General: No scleral icterus.       Right eye: No discharge.        Left eye: No discharge.     Conjunctiva/sclera: Conjunctivae normal.  Neck:     Vascular: No carotid bruit.  Cardiovascular:     Rate and Rhythm: Normal rate and regular rhythm.     Pulses: Normal pulses.     Heart sounds: Normal heart sounds. No murmur heard.    No friction rub. No gallop.  Pulmonary:     Effort: Pulmonary effort is normal. No respiratory distress.     Breath sounds: Normal breath sounds. No wheezing, rhonchi or rales.  Chest:     Chest wall: No tenderness.  Abdominal:     General: Bowel sounds are normal. There is no distension.     Palpations: Abdomen is soft. There is no mass.     Tenderness: There is no abdominal tenderness. There is no right CVA tenderness, left CVA tenderness, guarding or rebound.  Musculoskeletal:        General: No swelling or tenderness. Normal range of motion.     Cervical back: Normal range of motion. No rigidity or tenderness.     Right lower leg: No edema.     Left lower leg: No edema.  Lymphadenopathy:     Cervical: No cervical adenopathy.  Skin:    General: Skin is warm and dry.  Coloration: Skin is not pale.     Findings: No bruising, erythema, lesion or rash.     Comments: Left breast fold beefy redness without any discharge   Neurological:     Mental Status: She is alert and oriented to  person, place, and time.     Cranial Nerves: No cranial nerve deficit.     Sensory: No sensory deficit.     Motor: No weakness.     Coordination: Coordination normal.     Gait: Gait abnormal.  Psychiatric:        Mood and Affect: Mood normal.        Speech: Speech normal.        Behavior: Behavior normal.        Thought Content: Thought content normal.        Judgment: Judgment normal.     Labs reviewed: Recent Labs    08/10/21 1055 11/03/21 0823 03/23/22 0824  NA 138 139 139  K 3.6 4.3 4.0  CL 104 106 104  CO2 _0 GLUCOSE 100* 133* 206*  BUN _1 CREATININE 1.04 1.14* 1.32*  CALCIUM 9.3 9.4 9.4   Recent Labs    07/23/21 2220 11/03/21 0823  AST 20 20  ALT 19 14  ALKPHOS 91  --   BILITOT 0.1* 0.3  PROT 7.8 7.6  ALBUMIN 3.4*  --    Recent Labs    07/23/21 2220 11/03/21 0823 03/23/22 0824  WBC 8.6 5.5 5.8  NEUTROABS  --  2,651 2,772  HGB 10.6* 10.2* 10.2*  HCT 31.5* 30.8* 30.7*  MCV 85.1 86.3 84.6  PLT 414* 448* 405*   Lab Results  Component Value Date   TSH 1.29 11/03/2021   Lab Results  Component Value Date   HGBA1C 9.2 (H) 03/23/2022   Lab Results  Component Value Date   CHOL 134 11/03/2021   HDL 46 (L) 11/03/2021   LDLCALC 71 11/03/2021   LDLDIRECT 54.0 08/10/2021   TRIG 87 11/03/2021   CHOLHDL 2.9 11/03/2021    Significant Diagnostic Results in last 30 days:  No results found.  Assessment/Plan 1. Essential hypertension B/p well controlled  Continue on amlodipine  - TSH; Future - COMPLETE METABOLIC PANEL WITH GFR; Future - CBC with Differential/Platelet; Future  2. Hyperlipidemia LDL goal <70 LDL at goal  Continue on atorvastatin  - Lipid panel; Future  3. Type 2 diabetes mellitus with diabetic neuropathy, with long-term current use of insulin (HCC) Lab Results  Component Value Date   HGBA1C 9.2 (H) 03/23/2022   - Insulin Pen Needle (BD PEN NEEDLE NANO 2ND GEN) 32G X 4 MM MISC; 1 Device by Other route 4 (four)  times daily. E11.40  Dispense: 200 each; Refill: 1 - Hemoglobin A1c; Future  4. Iron deficiency anemia, unspecified iron deficiency anemia type Hgb stable at baseline.No signs of bleeding  - ferrous sulfate 325 (65 FE) MG tablet; Take 1 tablet (325 mg total) by mouth 2 (two) times daily with a meal.  Dispense: 90 tablet; Refill: 1 - docusate sodium (COLACE) 100 MG capsule; Take 1 capsule (100 mg total) by mouth daily.  Dispense: 10 capsule; Refill: 0 - CBC with Differential/Platelet; Future  5. Candidiasis of breast Left breast fold beef redness noted. Apply Nystatin as below  - nystatin cream (MYCOSTATIN); Apply 1 Application topically 2 (two) times daily.  Dispense: 30 g; Refill: 3  Family/ staff Communication: Reviewed plan of care with patient verbalized understanding   Labs/tests  ordered: None   Next Appointment : Return in about 6 months (around 10/27/2022) for medical mangement of chronic issues., Fasting labs in 6 months prior to visit.   Sandrea Hughs, NP

## 2022-05-13 ENCOUNTER — Other Ambulatory Visit: Payer: Self-pay | Admitting: Family

## 2022-05-13 DIAGNOSIS — D509 Iron deficiency anemia, unspecified: Secondary | ICD-10-CM

## 2022-06-15 ENCOUNTER — Ambulatory Visit: Payer: 59

## 2022-06-23 DIAGNOSIS — Z961 Presence of intraocular lens: Secondary | ICD-10-CM | POA: Diagnosis not present

## 2022-06-23 DIAGNOSIS — E113593 Type 2 diabetes mellitus with proliferative diabetic retinopathy without macular edema, bilateral: Secondary | ICD-10-CM | POA: Diagnosis not present

## 2022-06-23 DIAGNOSIS — H40013 Open angle with borderline findings, low risk, bilateral: Secondary | ICD-10-CM | POA: Diagnosis not present

## 2022-06-23 DIAGNOSIS — H35373 Puckering of macula, bilateral: Secondary | ICD-10-CM | POA: Diagnosis not present

## 2022-06-23 DIAGNOSIS — H26492 Other secondary cataract, left eye: Secondary | ICD-10-CM | POA: Diagnosis not present

## 2022-06-23 LAB — HM DIABETES EYE EXAM

## 2022-06-24 ENCOUNTER — Encounter: Payer: Self-pay | Admitting: Family

## 2022-07-10 ENCOUNTER — Other Ambulatory Visit: Payer: Self-pay | Admitting: Family

## 2022-07-21 ENCOUNTER — Other Ambulatory Visit: Payer: Medicare Other

## 2022-07-21 ENCOUNTER — Inpatient Hospital Stay: Admission: RE | Admit: 2022-07-21 | Payer: 59 | Source: Ambulatory Visit

## 2022-08-10 ENCOUNTER — Other Ambulatory Visit: Payer: Self-pay | Admitting: Family Medicine

## 2022-08-10 DIAGNOSIS — E7800 Pure hypercholesterolemia, unspecified: Secondary | ICD-10-CM

## 2022-08-13 ENCOUNTER — Other Ambulatory Visit: Payer: Self-pay | Admitting: Family Medicine

## 2022-08-13 DIAGNOSIS — E7800 Pure hypercholesterolemia, unspecified: Secondary | ICD-10-CM

## 2022-08-15 ENCOUNTER — Other Ambulatory Visit: Payer: Self-pay

## 2022-08-15 DIAGNOSIS — E7800 Pure hypercholesterolemia, unspecified: Secondary | ICD-10-CM

## 2022-08-15 MED ORDER — ATORVASTATIN CALCIUM 40 MG PO TABS: ORAL_TABLET | ORAL | 2 refills | Status: DC

## 2022-08-19 ENCOUNTER — Ambulatory Visit
Admission: RE | Admit: 2022-08-19 | Discharge: 2022-08-19 | Disposition: A | Discharge status: Home / Self Care | Payer: 59 | Source: Ambulatory Visit | Attending: Family Medicine | Admitting: Family Medicine

## 2022-08-19 ENCOUNTER — Other Ambulatory Visit: Payer: Medicare Other

## 2022-08-19 DIAGNOSIS — Z1231 Encounter for screening mammogram for malignant neoplasm of breast: Secondary | ICD-10-CM | POA: Diagnosis not present

## 2022-08-25 ENCOUNTER — Other Ambulatory Visit: Payer: Self-pay | Admitting: Family

## 2022-08-25 ENCOUNTER — Other Ambulatory Visit: Payer: Self-pay

## 2022-08-25 DIAGNOSIS — E1140 Type 2 diabetes mellitus with diabetic neuropathy, unspecified: Secondary | ICD-10-CM

## 2022-08-25 MED ORDER — LANTUS SOLOSTAR 100 UNIT/ML ~~LOC~~ SOPN: 44.0000 [IU] | PEN_INJECTOR | Freq: Every day | SUBCUTANEOUS | 2 refills | Status: DC

## 2022-08-26 ENCOUNTER — Other Ambulatory Visit: Payer: Self-pay

## 2022-08-26 ENCOUNTER — Other Ambulatory Visit: Payer: Self-pay | Admitting: Adult Health

## 2022-08-26 DIAGNOSIS — Z794 Long term (current) use of insulin: Secondary | ICD-10-CM

## 2022-08-26 MED ORDER — LANTUS SOLOSTAR 100 UNIT/ML ~~LOC~~ SOPN
44.0000 [IU] | PEN_INJECTOR | Freq: Every day | SUBCUTANEOUS | 2 refills | Status: DC
  Filled 2022-08-26: qty 15, 34d supply, fill #0

## 2022-08-29 ENCOUNTER — Other Ambulatory Visit: Payer: Self-pay

## 2022-09-09 ENCOUNTER — Encounter: Payer: Self-pay | Admitting: Adult Health

## 2022-09-09 ENCOUNTER — Ambulatory Visit (INDEPENDENT_AMBULATORY_CARE_PROVIDER_SITE_OTHER): Payer: 59 | Admitting: Adult Health

## 2022-09-09 VITALS — BP 130/80 | HR 88 | Temp 98.1°F | Resp 18 | Ht 67.0 in | Wt 216.2 lb

## 2022-09-09 DIAGNOSIS — M791 Myalgia, unspecified site: Secondary | ICD-10-CM

## 2022-09-09 MED ORDER — BIOFREEZE 4 % EX GEL: 1.0000 | Freq: Four times a day (QID) | CUTANEOUS | 0 refills | Status: DC | PRN

## 2022-09-09 NOTE — Progress Notes (Signed)
Location:     Place of Service:   clinic   CODE STATUS:   Allergies  Allergen Reactions   Penicillins     Said she was young had a reaction    Sulfa Antibiotics Other (See Comments)    Pt unable to report reaction Said she had a rash    Chief Complaint  Patient presents with   Acute Visit    Complains of pain when breathing in and out    HPI:  For the past week she has been having left rib cage area pain. She denies any shortness of breath; no chest pain; no diaphoresis present. She does have chronic osteoarthritic pain in shoulders and hands. The pain is sharp and lasts for several seconds and fades away.   Past Medical History:  Diagnosis Date   Diabetes mellitus without complication    Diabetic retinopathy    PDR OU   Hypertension    Hypertensive retinopathy    OU   Stroke 2019    Past Surgical History:  Procedure Laterality Date   CATARACT EXTRACTION Bilateral    COLONOSCOPY     [facility] but not sure. Said it was negative   ESOPHAGOGASTRODUODENOSCOPY     around the same time said it was negative    EYE SURGERY Bilateral    Cat Sx OU   GALLBLADDER SURGERY      Social History   Socioeconomic History   Marital status: Single    Spouse name: Not on file   Number of children: Not on file   Years of education: Not on file   Highest education level: Not on file  Occupational History   Not on file  Tobacco Use   Smoking status: Never   Smokeless tobacco: Never  Vaping Use   Vaping Use: Never used  Substance and Sexual Activity   Alcohol use: Never   Drug use: Never   Sexual activity: Not Currently  Other Topics Concern   Not on file  Social History Narrative   Not on file   Social Determinants of Health   Financial Resource Strain: Low Risk  (09/22/2021)   Overall Financial Resource Strain (CARDIA)    Difficulty of Paying Living Expenses: Not hard at all  Food Insecurity: No Food Insecurity (09/22/2021)   Hunger Vital Sign     Worried About Running Out of Food in the Last Year: Never true    Ran Out of Food in the Last Year: Never true  Transportation Needs: No Transportation Needs (09/22/2021)   PRAPARE - Administrator, Civil Service (Medical): No    Lack of Transportation (Non-Medical): No  Physical Activity: Insufficiently Active (09/22/2021)   Exercise Vital Sign    Days of Exercise per Week: 2 days    Minutes of Exercise per Session: 20 min  Stress: No Stress Concern Present (09/22/2021)   [employer] of Occupational Health - Occupational Stress Questionnaire    Feeling of Stress : Not at all  Social Connections: Moderately Isolated (09/22/2021)   Social Connection and Isolation Panel [NHANES]    Frequency of Communication with Friends and Family: Three times a week    Frequency of Social Gatherings with Friends and Family: Three times a week    Attends Religious Services: More than 4 times per year    Active Member of Clubs or Organizations: No    Attends Banker Meetings: Never    Marital Status: Divorced  Catering manager Violence:  Not At Risk (09/22/2021)   Humiliation, Afraid, Rape, and Kick questionnaire    Fear of Current or Ex-Partner: No    Emotionally Abused: No    Physically Abused: No    Sexually Abused: No   Family History  Problem Relation Age of Onset   Diabetes Mother    Breast cancer Sister    Diabetes Sister    Diabetes Daughter    Diabetes Brother    Colon cancer Neg Hx    Esophageal cancer Neg Hx    Rectal cancer Neg Hx    Stomach cancer Neg Hx       VITAL SIGNS BP 130/80   Pulse 88   Temp 98.1 F (36.7 C)   Resp 18   Ht  (1.702 m)   Wt 216 lb 4 oz (98.1 kg)   LMP  (LMP Unknown)   SpO2 98%   BMI 33.87 kg/m   Outpatient Encounter Medications as of 09/09/2022  Medication Sig   acetaminophen (TYLENOL) 500 MG tablet Take 500 mg by mouth in the morning and at bedtime.   amLODipine (NORVASC) 5 MG tablet TAKE 1 TABLET(5 MG) BY  MOUTH DAILY   Apoaequorin (PREVAGEN PO) Take 50 mcg by mouth daily.   atorvastatin (LIPITOR) 40 MG tablet TAKE 1 TABLET(40 MG) BY MOUTH DAILY   Blood Glucose Monitoring Suppl (ONE TOUCH ULTRA 2) w/Device KIT by Does not apply route.   cimetidine (TAGAMET) 300 MG tablet TAKE 1 TABLET(300 MG) BY MOUTH AT BEDTIME   clopidogrel (PLAVIX) 75 MG tablet TAKE 1 TABLET(75 MG) BY MOUTH DAILY   docusate sodium (COLACE) 100 MG capsule TAKE 1 CAPSULE(100 MG) BY MOUTH DAILY   ferrous sulfate 325 (65 FE) MG tablet Take 1 tablet (325 mg total) by mouth 2 (two) times daily with a meal.   gabapentin (NEURONTIN) 300 MG capsule TAKE 1 CAPSULE BY MOUTH AT NIGHT FOR 1 WEEK. INCREASE TO 1 CAPSULE 2 TIMES DAILY AS TOLERATED   glucose blood (ONETOUCH ULTRA) test strip TEST UPTO 4 TIMES DAILY   Glycerin-Polysorbate 80 (REFRESH DRY EYE THERAPY OP) Place 1-2 drops into both eyes as needed (for dryness).    insulin glargine (LANTUS SOLOSTAR) 100 UNIT/ML Solostar Pen Inject 44 Units into the skin daily.   insulin lispro (HUMALOG KWIKPEN) 100 UNIT/ML KwikPen ADMINISTER 0 TO 15 UNITS UNDER THE SKIN INTO SKIN THREE TIMES DAILY   Insulin Pen Needle (BD PEN NEEDLE NANO 2ND GEN) 32G X 4 MM MISC 1 Device by Other route 4 (four) times daily. E11.40   Insulin Syringe-Needle U-100 (INSULIN SYRINGE 1CC/31GX5/16") 31G X 5/16" 1 ML MISC USE AS DIRECTED WITH LANTUS   Multiple Vitamins-Minerals (MULTIVITAMIN ADULT PO) Take 1 tablet by mouth daily.   nystatin cream (MYCOSTATIN) Apply 1 Application topically 2 (two) times daily.   VITAMIN D PO Take 50 mg by mouth daily.   No facility-administered encounter medications on file as of 09/09/2022.     SIGNIFICANT DIAGNOSTIC EXAMS  Review of Systems  Constitutional:  Negative for malaise/fatigue.  Respiratory:  Negative for cough and shortness of breath.   Cardiovascular:  Negative for chest pain, palpitations and leg swelling.  Gastrointestinal:  Negative for abdominal pain, constipation  and heartburn.  Musculoskeletal:  Positive for myalgias. Negative for back pain and joint pain.  Skin: Negative.   Neurological:  Negative for dizziness.  Psychiatric/Behavioral:  The patient is not nervous/anxious.      Physical Exam Constitutional:      General: She is  not in acute distress.    Appearance: She is well-developed. She is obese. She is not diaphoretic.  Neck:     Thyroid: No thyromegaly.  Cardiovascular:     Rate and Rhythm: Normal rate and regular rhythm.     Pulses: Normal pulses.     Heart sounds: Normal heart sounds.  Pulmonary:     Effort: Pulmonary effort is normal. No respiratory distress.     Breath sounds: Normal breath sounds.  Abdominal:     General: Bowel sounds are normal. There is no distension.     Palpations: Abdomen is soft.     Tenderness: There is no abdominal tenderness.  Musculoskeletal:        General: Normal range of motion.     Cervical back: Neck supple.     Right lower leg: No edema.     Left lower leg: No edema.     Comments: Mild tenderness to palpation on left rib area and flank area.   Lymphadenopathy:     Cervical: No cervical adenopathy.  Skin:    General: Skin is warm and dry.  Neurological:     Mental Status: She is alert and oriented to person, place, and time.  Psychiatric:        Mood and Affect: Mood normal.       ASSESSMENT/ PLAN:  TODAY  Pain on movement of skeletal muscle: to use biofreeze as needed    Synthia Innocent NP [facility]  call [phone]

## 2022-09-14 ENCOUNTER — Ambulatory Visit (INDEPENDENT_AMBULATORY_CARE_PROVIDER_SITE_OTHER): Payer: 59 | Admitting: Podiatry

## 2022-09-14 DIAGNOSIS — B351 Tinea unguium: Secondary | ICD-10-CM | POA: Diagnosis not present

## 2022-09-14 DIAGNOSIS — M79675 Pain in left toe(s): Secondary | ICD-10-CM

## 2022-09-14 DIAGNOSIS — M79674 Pain in right toe(s): Secondary | ICD-10-CM

## 2022-09-14 DIAGNOSIS — E119 Type 2 diabetes mellitus without complications: Secondary | ICD-10-CM | POA: Diagnosis not present

## 2022-09-14 NOTE — Progress Notes (Signed)
Chief Complaint  Patient presents with   Diabetes    Diabetic foot care, nail trim, A1c- 9.2, BG- 140(yesterday), left hallux nail has come off,     SUBJECTIVE Patient with a history of diabetes mellitus presents to office today complaining of elongated, thickened nails that cause pain while ambulating in shoes.  Patient is unable to trim their own nails. Patient is here for further evaluation and treatment.  Past Medical History:  Diagnosis Date   Diabetes mellitus without complication    Diabetic retinopathy    PDR OU   Hypertension    Hypertensive retinopathy    OU   Stroke 2019    Allergies  Allergen Reactions   Penicillins     Said she was young had a reaction    Sulfa Antibiotics Other (See Comments)    Pt unable to report reaction Said she had a rash     OBJECTIVE General Patient is awake, alert, and oriented x 3 and in no acute distress. Derm Skin is dry and supple bilateral. Negative open lesions or macerations. Remaining integument unremarkable. Nails are tender, long, thickened and dystrophic with subungual debris, consistent with onychomycosis, 1-5 bilateral. No signs of infection noted. Vasc skin cool to touch.  Diminished pulses. VAS [place] ABI W/WO TBI 01/15/2020: ABI Findings:  +---------+------------------+-----+---------+--------+  Right   Rt Pressure (mmHg)IndexWaveform Comment   +---------+------------------+-----+---------+--------+  Brachial 117                                       +---------+------------------+-----+---------+--------+  ATA     129               1.01 triphasic          +---------+------------------+-----+---------+--------+  PTA     145               1.13 triphasic          +---------+------------------+-----+---------+--------+  PERO    125               0.98 triphasic          +---------+------------------+-----+---------+--------+  Great Toe120               0.94 Normal              +---------+------------------+-----+---------+--------+   +---------+------------------+-----+---------+-------+  Left    Lt Pressure (mmHg)IndexWaveform Comment  +---------+------------------+-----+---------+-------+  Brachial 128                                      +---------+------------------+-----+---------+-------+  ATA     150               1.17 triphasic         +---------+------------------+-----+---------+-------+  PTA     127               0.99 triphasic         +---------+------------------+-----+---------+-------+  PERO    144               1.12 triphasic         +---------+------------------+-----+---------+-------+  Great Toe109               0.85 Normal            +---------+------------------+-----+---------+-------+   +-------+-----------+-----------+------------+------------+  ABI/TBIToday's ABIToday's TBIPrevious ABIPrevious TBI  +-------+-----------+-----------+------------+------------+  Right 1.13       .94        1.08        .73           +-------+-----------+-----------+------------+------------+  Left  1.17       .85        1.11        .61           +-------+-----------+-----------+------------+------------+  Bilateral ABIs and TBIs appear essentially unchanged compared to prior  study on 03/05/2019.   Summary:  Right: Resting right ankle-brachial index is within normal range. No  evidence of significant right lower extremity arterial disease. The right  toe-brachial index is normal.  Left: Resting left ankle-brachial index is within normal range. No  evidence of significant left lower extremity arterial disease. The left  toe-brachial index is normal.  Neuro Epicritic and protective threshold sensation diminished bilaterally.  Musculoskeletal Exam No symptomatic pedal deformities noted bilateral. Muscular strength within normal limits.  ASSESSMENT 1. Diabetes Mellitus w/ peripheral neuropathy 2.   Pain due to onychomycosis of toenails bilateral 3.  Encounter for diabetic foot exam  PLAN OF CARE 1. Patient evaluated today.  Comprehensive diabetic foot exam performed today 2. Instructed to maintain good pedal hygiene and foot care. Stressed importance of controlling blood sugar.  3. Mechanical debridement of nails 1-5 bilaterally performed using a nail nipper. Filed with dremel without incident.  4. Return to clinic as needed    Felecia Shelling, DPM [facility]  Dr. Felecia Shelling, DPM    [address] [place], [place] [address]                Office [phone]  Fax [phone]

## 2022-09-26 ENCOUNTER — Encounter: Payer: Self-pay | Admitting: Family

## 2022-09-26 ENCOUNTER — Encounter: Payer: 59 | Admitting: Family

## 2022-09-26 DIAGNOSIS — Z0000 Encounter for general adult medical examination without abnormal findings: Secondary | ICD-10-CM

## 2022-09-26 NOTE — Progress Notes (Unsigned)
  This encounter was created in error - please disregard.  Did not answer the phone x 3 voicemail left.

## 2022-09-30 ENCOUNTER — Other Ambulatory Visit: Payer: Self-pay | Admitting: Family

## 2022-09-30 DIAGNOSIS — E1140 Type 2 diabetes mellitus with diabetic neuropathy, unspecified: Secondary | ICD-10-CM

## 2022-10-05 ENCOUNTER — Other Ambulatory Visit: Payer: Medicare Other

## 2022-10-12 ENCOUNTER — Encounter: Payer: 59 | Admitting: Family

## 2022-10-12 NOTE — Progress Notes (Signed)
This encounter was created in error - please disregard.

## 2022-10-13 ENCOUNTER — Encounter: Payer: 59 | Admitting: Family

## 2022-10-26 ENCOUNTER — Other Ambulatory Visit: Payer: 59

## 2022-10-26 DIAGNOSIS — Z794 Long term (current) use of insulin: Secondary | ICD-10-CM | POA: Diagnosis not present

## 2022-10-26 DIAGNOSIS — E1140 Type 2 diabetes mellitus with diabetic neuropathy, unspecified: Secondary | ICD-10-CM | POA: Diagnosis not present

## 2022-10-26 DIAGNOSIS — I10 Essential (primary) hypertension: Secondary | ICD-10-CM | POA: Diagnosis not present

## 2022-10-26 DIAGNOSIS — D509 Iron deficiency anemia, unspecified: Secondary | ICD-10-CM | POA: Diagnosis not present

## 2022-10-26 DIAGNOSIS — E785 Hyperlipidemia, unspecified: Secondary | ICD-10-CM | POA: Diagnosis not present

## 2022-10-27 ENCOUNTER — Other Ambulatory Visit: Payer: Medicaid Other

## 2022-10-27 DIAGNOSIS — E785 Hyperlipidemia, unspecified: Secondary | ICD-10-CM

## 2022-10-27 DIAGNOSIS — D509 Iron deficiency anemia, unspecified: Secondary | ICD-10-CM

## 2022-10-27 DIAGNOSIS — I10 Essential (primary) hypertension: Secondary | ICD-10-CM

## 2022-10-27 DIAGNOSIS — E1140 Type 2 diabetes mellitus with diabetic neuropathy, unspecified: Secondary | ICD-10-CM

## 2022-10-27 LAB — CBC WITH DIFFERENTIAL/PLATELET
Absolute Monocytes: 489 cells/uL (ref 200–950)
Basophils Absolute: 62 cells/uL (ref 0–200)
Basophils Relative: 1.2 %
Eosinophils Absolute: 140 cells/uL (ref 15–500)
Eosinophils Relative: 2.7 %
HCT: 31.5 % — ABNORMAL LOW (ref 35.0–45.0)
Hemoglobin: 10.1 g/dL — ABNORMAL LOW (ref 11.7–15.5)
Lymphs Abs: 2033 cells/uL (ref 850–3900)
MCH: 27.4 pg (ref 27.0–33.0)
MCHC: 32.1 g/dL (ref 32.0–36.0)
MCV: 85.6 fL (ref 80.0–100.0)
MPV: 9.2 fL (ref 7.5–12.5)
Monocytes Relative: 9.4 %
Neutro Abs: 2475 cells/uL (ref 1500–7800)
Neutrophils Relative %: 47.6 %
Platelets: 456 10*3/uL — ABNORMAL HIGH (ref 140–400)
RBC: 3.68 10*6/uL — ABNORMAL LOW (ref 3.80–5.10)
RDW: 12.3 % (ref 11.0–15.0)
Total Lymphocyte: 39.1 %
WBC: 5.2 10*3/uL (ref 3.8–10.8)

## 2022-10-27 LAB — LIPID PANEL
Cholesterol: 118 mg/dL (ref <200)
HDL: 41 mg/dL — ABNORMAL LOW (ref >=50)
LDL Cholesterol (Calc): 61 mg/dL (calc)
Non-HDL Cholesterol (Calc): 77 mg/dL (calc) (ref <130)
Total CHOL/HDL Ratio: 2.9 (calc) (ref <5.0)
Triglycerides: 79 mg/dL (ref <150)

## 2022-10-27 LAB — COMPLETE METABOLIC PANEL WITH GFR
AG Ratio: 1.1 (calc) (ref 1.0–2.5)
ALT: 13 U/L (ref 6–29)
AST: 17 U/L (ref 10–35)
Albumin: 3.7 g/dL (ref 3.6–5.1)
Alkaline phosphatase (APISO): 82 U/L (ref 37–153)
BUN/Creatinine Ratio: 17 (calc) (ref 6–22)
BUN: 22 mg/dL (ref 7–25)
CO2: 26 mmol/L (ref 20–32)
Calcium: 9 mg/dL (ref 8.6–10.4)
Chloride: 108 mmol/L (ref 98–110)
Creat: 1.3 mg/dL — ABNORMAL HIGH (ref 0.60–1.00)
Globulin: 3.4 g/dL (calc) (ref 1.9–3.7)
Glucose, Bld: 116 mg/dL — ABNORMAL HIGH (ref 65–99)
Potassium: 4.3 mmol/L (ref 3.5–5.3)
Sodium: 140 mmol/L (ref 135–146)
Total Bilirubin: 0.3 mg/dL (ref 0.2–1.2)
Total Protein: 7.1 g/dL (ref 6.1–8.1)
eGFR: 43 mL/min/{1.73_m2} — ABNORMAL LOW (ref >=60)

## 2022-10-27 LAB — HEMOGLOBIN A1C
Hgb A1c MFr Bld: 7.9 % of total Hgb — ABNORMAL HIGH (ref <5.7)
Mean Plasma Glucose: 180 mg/dL
eAG (mmol/L): 10 mmol/L

## 2022-10-27 LAB — TSH: TSH: 1.05 mIU/L (ref 0.40–4.50)

## 2022-11-01 ENCOUNTER — Ambulatory Visit: Payer: Medicare Other | Admitting: Family

## 2022-11-02 ENCOUNTER — Ambulatory Visit: Payer: 59 | Admitting: Family

## 2022-11-07 ENCOUNTER — Ambulatory Visit (INDEPENDENT_AMBULATORY_CARE_PROVIDER_SITE_OTHER): Payer: 59 | Admitting: Family

## 2022-11-07 ENCOUNTER — Encounter: Payer: Self-pay | Admitting: Family

## 2022-11-07 VITALS — BP 120/60 | HR 88 | Temp 98.0°F | Resp 20 | Ht 67.0 in | Wt 213.6 lb

## 2022-11-07 DIAGNOSIS — K219 Gastro-esophageal reflux disease without esophagitis: Secondary | ICD-10-CM | POA: Diagnosis not present

## 2022-11-07 DIAGNOSIS — Z794 Long term (current) use of insulin: Secondary | ICD-10-CM | POA: Diagnosis not present

## 2022-11-07 DIAGNOSIS — E785 Hyperlipidemia, unspecified: Secondary | ICD-10-CM | POA: Diagnosis not present

## 2022-11-07 DIAGNOSIS — E1140 Type 2 diabetes mellitus with diabetic neuropathy, unspecified: Secondary | ICD-10-CM

## 2022-11-07 DIAGNOSIS — D509 Iron deficiency anemia, unspecified: Secondary | ICD-10-CM | POA: Diagnosis not present

## 2022-11-07 DIAGNOSIS — E113593 Type 2 diabetes mellitus with proliferative diabetic retinopathy without macular edema, bilateral: Secondary | ICD-10-CM

## 2022-11-07 DIAGNOSIS — N1832 Chronic kidney disease, stage 3b: Secondary | ICD-10-CM

## 2022-11-07 DIAGNOSIS — I10 Essential (primary) hypertension: Secondary | ICD-10-CM

## 2022-11-07 MED ORDER — AMLODIPINE BESYLATE 5 MG PO TABS: ORAL_TABLET | ORAL | 1 refills | Status: DC

## 2022-11-07 NOTE — Patient Instructions (Signed)
Schedule AWV Call to schedule bone density

## 2022-11-07 NOTE — Progress Notes (Signed)
Provider: Richarda Blade FNP-C   Giamarie Bueche, [name], NP  Patient Care Team: Dawud Mays, [name], NP as PCP - General (Family Medicine) [facility]  Extended Emergency Contact Information Primary Emergency Contact: Lucretia Roers Mobile Phone: [phone] Relation: Daughter Secondary Emergency Contact: Maxie Better Mobile Phone: [phone] Relation: Daughter  Code Status:  Full Code  Goals of care: Advanced Directive information    09/26/2022    1:20 PM  Advanced Directives  Does Patient Have a Medical Advance Directive? No  Would patient like information on creating a medical advance directive? No - Patient declined     Chief Complaint  Patient presents with   Medical Management of Chronic Issues    Patient presents today for a 6 month follow-up   Quality Metric Gaps    AWV, DEXA scan, urine microalbumin, foot exam, pneumonia     HPI:  Pt is a 77 y.o. female seen today for medical management of chronic diseases.    States missed appointment for bone density but will call to rescheduled.   Anemia - Hgb 10.1 stable still on Ferrous sulfate   Type 2 DM - Glucose 116 and Hgb A1C  Has seen ophthalmology January,2024 has retinopathy.Also follows up with podiatrist left toenail came off but healed well. Has numbness and tingling on the hands and toes.sometimes unable to pickup stuff or open tops. On Gabapentin.   Chronic Kidney disease CR 1.30,GFR 43    Past Medical History:  Diagnosis Date   Diabetes mellitus without complication (HCC)    Diabetic retinopathy (HCC)    PDR OU   Hypertension    Hypertensive retinopathy    OU   Stroke (HCC) 2019   Past Surgical History:  Procedure Laterality Date   CATARACT EXTRACTION Bilateral    COLONOSCOPY     [facility] possibly but not sure. [place] Spaniel it was negative   ESOPHAGOGASTRODUODENOSCOPY     around the same time said it was negative    EYE SURGERY Bilateral    Cat Sx OU   GALLBLADDER SURGERY       Allergies  Allergen Reactions   Penicillins     [place] Spaniel she was young had a reaction    Sulfa Antibiotics Other (See Comments)    Pt unable to report reaction Said she had a rash    Allergies as of 11/07/2022       Reactions   Penicillins    [place] Spaniel she was young had a reaction    Sulfa Antibiotics Other (See Comments)   Pt unable to report reaction Said she had a rash        Medication List        Accurate as of November 07, 2022  2:00 PM. If you have any questions, ask your nurse or doctor.          acetaminophen 500 MG tablet Commonly known as: TYLENOL Take 500 mg by mouth in the morning and at bedtime.   amLODipine 5 MG tablet Commonly known as: NORVASC TAKE 1 TABLET(5 MG) BY MOUTH DAILY   atorvastatin 40 MG tablet Commonly known as: LIPITOR TAKE 1 TABLET(40 MG) BY MOUTH DAILY   BD Pen Needle Nano 2nd Gen 32G X 4 MM Misc Generic drug: Insulin Pen Needle 1 Device by Other route 4 (four) times daily. E11.40   Biofreeze 4 % Gel Generic drug: Menthol (Topical Analgesic) Apply 1 Application topically every 6 (six) hours as needed.   cimetidine 300 MG tablet Commonly known  as: TAGAMET TAKE 1 TABLET(300 MG) BY MOUTH AT BEDTIME   clopidogrel 75 MG tablet Commonly known as: PLAVIX TAKE 1 TABLET(75 MG) BY MOUTH DAILY   docusate sodium 100 MG capsule Commonly known as: COLACE TAKE 1 CAPSULE(100 MG) BY MOUTH DAILY   ferrous sulfate 325 (65 FE) MG tablet Take 1 tablet (325 mg total) by mouth 2 (two) times daily with a meal.   gabapentin 300 MG capsule Commonly known as: NEURONTIN TAKE 1 CAPSULE BY MOUTH AT NIGHT FOR 1 WEEK. INCREASE TO 1 CAPSULE 2 TIMES DAILY AS TOLERATED   insulin lispro 100 UNIT/ML KwikPen Commonly known as: HumaLOG KwikPen ADMINISTER 0 TO 15 UNITS UNDER THE SKIN INTO SKIN THREE TIMES DAILY   INSULIN SYRINGE 1CC/31GX5/16" 31G X 5/16" 1 ML Misc USE AS DIRECTED WITH LANTUS   Lantus SoloStar 100 UNIT/ML Solostar Pen Generic drug: insulin  glargine ADMINISTER 44 UNITS UNDER THE SKIN DAILY   MULTIVITAMIN ADULT PO Take 1 tablet by mouth daily.   nystatin cream Commonly known as: MYCOSTATIN Apply 1 Application topically 2 (two) times daily.   ONE TOUCH ULTRA 2 w/Device Kit by Does not apply route.   OneTouch Ultra test strip Generic drug: glucose blood TEST UPTO 4 TIMES DAILY   PREVAGEN PO Take 50 mcg by mouth daily.   REFRESH DRY EYE THERAPY OP Place 1-2 drops into both eyes as needed (for dryness).   VITAMIN D PO Take 50 mg by mouth daily.        Review of Systems  Constitutional:  Negative for appetite change, chills, fatigue, fever and unexpected weight change.  HENT:  Negative for congestion, dental problem, ear discharge, ear pain, facial swelling, hearing loss, nosebleeds, postnasal drip, rhinorrhea, sinus pressure, sinus pain, sneezing, sore throat, tinnitus and trouble swallowing.   Eyes:  Negative for pain, discharge, redness, itching and visual disturbance.  Respiratory:  Negative for cough, chest tightness, shortness of breath and wheezing.   Cardiovascular:  Negative for chest pain, palpitations and leg swelling.  Gastrointestinal:  Negative for abdominal distention, abdominal pain, blood in stool, constipation, diarrhea, nausea and vomiting.  Endocrine: Negative for cold intolerance, heat intolerance, polydipsia, polyphagia and polyuria.  Genitourinary:  Negative for difficulty urinating, dysuria, flank pain, frequency and urgency.  Musculoskeletal:  Positive for arthralgias and gait problem. Negative for back pain, joint swelling, myalgias, neck pain and neck stiffness.  Skin:  Negative for color change, pallor, rash and wound.  Neurological:  Positive for numbness. Negative for dizziness, syncope, speech difficulty, weakness, light-headedness and headaches.       Numbness and tingling on fingers and feet   Hematological:  Does not bruise/bleed easily.  Psychiatric/Behavioral:  Negative for  agitation, behavioral problems, confusion, hallucinations, self-injury, sleep disturbance and suicidal ideas. The patient is not nervous/anxious.     Immunization History  Administered Date(s) Administered   Influenza, High Dose Seasonal PF 03/07/2018   Influenza,inj,Quad PF,6+ Mos 01/31/2019   PFIZER(Purple Top)SARS-COV-2 Vaccination 10/03/2019, 10/29/2019   Pertinent  Health Maintenance Due  Topic Date Due   DEXA SCAN  Never done   FOOT EXAM  10/27/2022   HEMOGLOBIN A1C  04/28/2023   OPHTHALMOLOGY EXAM  06/24/2023   MAMMOGRAM  08/19/2023   Colonoscopy  03/31/2030   INFLUENZA VACCINE  Discontinued      10/26/2021    2:13 PM 04/28/2022    1:15 PM 09/09/2022    2:56 PM 09/26/2022    1:20 PM 11/07/2022    1:38 PM  Fall Risk  Falls in the past year? 0 0 0 0 0  Was there an injury with Fall? 0 0 0 0 0  Fall Risk Category Calculator 0 0 0 0 0  Fall Risk Category (Retired) Low Low     (RETIRED) Patient Fall Risk Level Low fall risk      Patient at Risk for Falls Due to No Fall Risks History of fall(s) No Fall Risks No Fall Risks No Fall Risks  Fall risk Follow up Falls evaluation completed Falls evaluation completed   Falls evaluation completed   Functional Status Survey:    Vitals:   11/07/22 1333  BP: 120/60  Pulse: 88  Resp: 20  Temp: 98 F (36.7 C)  SpO2: 98%  Weight: 213 lb 9.6 oz (96.9 kg)  Height: 5\' 7"  (1.702 m)   Body mass index is 33.45 kg/m. Physical Exam Vitals reviewed.  Constitutional:      General: She is not in acute distress.    Appearance: Normal appearance. She is obese. She is not ill-appearing or diaphoretic.  HENT:     Head: Normocephalic.     Right Ear: Tympanic membrane, ear canal and external ear normal. There is no impacted cerumen.     Left Ear: Tympanic membrane, ear canal and external ear normal. There is no impacted cerumen.     Nose: Nose normal. No congestion or rhinorrhea.     Mouth/Throat:     Mouth: Mucous membranes are moist.      Pharynx: Oropharynx is clear. No oropharyngeal exudate or posterior oropharyngeal erythema.  Eyes:     General: No scleral icterus.       Right eye: No discharge.        Left eye: No discharge.     Extraocular Movements: Extraocular movements intact.     Conjunctiva/sclera: Conjunctivae normal.     Pupils: Pupils are equal, round, and reactive to light.  Neck:     Vascular: No carotid bruit.  Cardiovascular:     Rate and Rhythm: Normal rate and regular rhythm.     Pulses: Normal pulses.     Heart sounds: Normal heart sounds. No murmur heard.    No friction rub. No gallop.  Pulmonary:     Effort: Pulmonary effort is normal. No respiratory distress.     Breath sounds: Normal breath sounds. No wheezing, rhonchi or rales.  Chest:     Chest wall: No tenderness.  Abdominal:     General: Bowel sounds are normal. There is no distension.     Palpations: Abdomen is soft. There is no mass.     Tenderness: There is no abdominal tenderness. There is no right CVA tenderness, left CVA tenderness, guarding or rebound.  Musculoskeletal:        General: No swelling or tenderness. Normal range of motion.     Cervical back: Normal range of motion. No rigidity or tenderness.     Right lower leg: No edema.     Left lower leg: No edema.  Lymphadenopathy:     Cervical: No cervical adenopathy.  Skin:    General: Skin is warm and dry.     Coloration: Skin is not pale.     Findings: No bruising, erythema, lesion or rash.  Neurological:     Mental Status: She is alert and oriented to person, place, and time.     Cranial Nerves: No cranial nerve deficit.     Sensory: Sensory deficit present.     Motor: No weakness.  Coordination: Coordination normal.     Gait: Gait abnormal.     Comments: Decreased monofilament sensation on both feet   Psychiatric:        Mood and Affect: Mood normal.        Speech: Speech normal.        Behavior: Behavior normal.        Thought Content: Thought content normal.         Judgment: Judgment normal.     Labs reviewed: Recent Labs    03/23/22 0824 10/26/22 1102  NA 139 140  K 4.0 4.3  CL 104 108  CO2 26 26  GLUCOSE 206* 116*  BUN 25 22  CREATININE 1.32* 1.30*  CALCIUM 9.4 9.0   Recent Labs    10/26/22 1102  AST 17  ALT 13  BILITOT 0.3  PROT 7.1   Recent Labs    03/23/22 0824 10/26/22 1102  WBC 5.8 5.2  NEUTROABS 2,772 2,475  HGB 10.2* 10.1*  HCT 30.7* 31.5*  MCV 84.6 85.6  PLT 405* 456*   Lab Results  Component Value Date   TSH 1.05 10/26/2022   Lab Results  Component Value Date   HGBA1C 7.9 (H) 10/26/2022   Lab Results  Component Value Date   CHOL 118 10/26/2022   HDL 41 (L) 10/26/2022   LDLCALC 61 10/26/2022   LDLDIRECT 54.0 08/10/2021   TRIG 79 10/26/2022   CHOLHDL 2.9 10/26/2022    Significant Diagnostic Results in last 30 days:  No results found.  Assessment/Plan 1. Type 2 diabetes mellitus with diabetic neuropathy, with long-term current use of insulin (HCC) Lab Results  Component Value Date   HGBA1C 7.9 (H) 10/26/2022  - continue on Lantus and Humalog  - continue on Gabapentin for neuropathy  - Microalbumin / creatinine urine ratio - TSH; Future - COMPLETE METABOLIC PANEL WITH GFR; Future - CBC with Differential/Platelet; Future - Hemoglobin A1c; Future  2. Essential hypertension B/p well controlled  -Continue on amlodipine and losartan - amLODipine (NORVASC) 5 MG tablet; TAKE 1 TABLET(5 MG) BY MOUTH DAILY  Dispense: 90 tablet; Refill: 1 - TSH; Future - COMPLETE METABOLIC PANEL WITH GFR; Future - CBC with Differential/Platelet; Future  3. Hyperlipidemia LDL goal <70 LDL at goal  - continue on atorvastatin  - dietary modification advised - Lipid panel; Future  4. Iron deficiency anemia, unspecified iron deficiency anemia type Continue on ferrous sulfate  - CBC with Differential/Platelet; Future  5. Gastroesophageal reflux disease without esophagitis Continue on tagamet  - CBC with  Differential/Platelet; Future  6. Proliferative diabetic retinopathy of both eyes without macular edema associated with type 2 diabetes mellitus (HCC) Continue to follow up with ophthalmology  7. Stage 3b chronic kidney disease (HCC) CR 1.30  - continue to avoid Nephrotoxins and dose all other medication for renal clearance  - Encouraged to increase water intake  - COMPLETE METABOLIC PANEL WITH GFR; Future - Lipid panel; Future Family/ staff Communication: Reviewed plan of care with patient verbalized understanding all  Labs/tests ordered:  - TSH; Future - COMPLETE METABOLIC PANEL WITH GFR; Future - CBC with Differential/Platelet; Future Hemoglobin A1c; Future  Next Appointment :  Return in about 6 months (around 05/09/2023) for medical mangement of chronic issues., Annual wellness soon.Fasting labs in 6 months prior to visit.  Caesar Bookman, NP

## 2022-11-08 ENCOUNTER — Telehealth: Payer: Self-pay

## 2022-11-08 ENCOUNTER — Other Ambulatory Visit: Payer: Self-pay

## 2022-11-08 LAB — MICROALBUMIN / CREATININE URINE RATIO
Creatinine, Urine: 74 mg/dL (ref 20–275)
Microalb Creat Ratio: 112 mg/g creat — ABNORMAL HIGH (ref <30)
Microalb, Ur: 8.3 mg/dL

## 2022-11-08 MED ORDER — LOSARTAN POTASSIUM 25 MG PO TABS: 25.0000 mg | ORAL_TABLET | Freq: Every day | ORAL | 0 refills | Status: DC

## 2022-11-08 NOTE — Telephone Encounter (Signed)
Noted. Thank You.

## 2022-11-08 NOTE — Telephone Encounter (Signed)
Recommend drinking orange followed by a snack if blood sugar is less than or 65. Monitor blood sugars and notify provider if blood sugars are consistently less than 75

## 2022-11-08 NOTE — Telephone Encounter (Addendum)
Patient call in this afternoon to report that she toke an extra dose of Humalog and that Blood Sugar was 42 this morning when the paramedic team came to her home and she did not pass out also patient. Patient did want to let [name], [name], NP that is okay.  Message routed to [name], [name], NP

## 2022-11-08 NOTE — Telephone Encounter (Signed)
Patient has agreed and she said thank you.  Message routed back to [name], [name], NP

## 2022-11-19 ENCOUNTER — Other Ambulatory Visit: Payer: Self-pay | Admitting: Family

## 2022-11-19 ENCOUNTER — Other Ambulatory Visit: Payer: Self-pay | Admitting: Family Medicine

## 2022-11-19 DIAGNOSIS — K219 Gastro-esophageal reflux disease without esophagitis: Secondary | ICD-10-CM

## 2022-12-08 DIAGNOSIS — H16002 Unspecified corneal ulcer, left eye: Secondary | ICD-10-CM | POA: Diagnosis not present

## 2022-12-09 DIAGNOSIS — H16002 Unspecified corneal ulcer, left eye: Secondary | ICD-10-CM | POA: Diagnosis not present

## 2022-12-12 DIAGNOSIS — H16002 Unspecified corneal ulcer, left eye: Secondary | ICD-10-CM | POA: Diagnosis not present

## 2022-12-21 DIAGNOSIS — H16002 Unspecified corneal ulcer, left eye: Secondary | ICD-10-CM | POA: Diagnosis not present

## 2023-01-06 ENCOUNTER — Other Ambulatory Visit: Payer: Self-pay | Admitting: Family Medicine

## 2023-01-06 DIAGNOSIS — E1140 Type 2 diabetes mellitus with diabetic neuropathy, unspecified: Secondary | ICD-10-CM

## 2023-01-27 ENCOUNTER — Encounter: Payer: Self-pay | Admitting: Family

## 2023-01-27 ENCOUNTER — Ambulatory Visit (INDEPENDENT_AMBULATORY_CARE_PROVIDER_SITE_OTHER): Payer: 59 | Admitting: Family

## 2023-01-27 VITALS — BP 130/74 | HR 92 | Temp 97.1°F | Resp 18 | Ht 67.0 in | Wt 205.0 lb

## 2023-01-27 DIAGNOSIS — H65191 Other acute nonsuppurative otitis media, right ear: Secondary | ICD-10-CM

## 2023-01-27 MED ORDER — LORATADINE 10 MG PO TABS: 10.0000 mg | ORAL_TABLET | Freq: Every day | ORAL | 11 refills | Status: DC

## 2023-01-27 MED ORDER — DOXYCYCLINE HYCLATE 100 MG PO TABS
100.0000 mg | ORAL_TABLET | Freq: Two times a day (BID) | ORAL | 0 refills | Status: AC
Start: 2023-01-27 — End: 2023-02-03

## 2023-01-27 NOTE — Progress Notes (Signed)
Provider: Richarda Blade FNP-C  Tara Conner, [name], NP  Patient Care Team: Gwenette Wellons, [name], NP as PCP - General (Family Medicine) [facility]  Extended Emergency Contact Information Primary Emergency Contact: Lucretia Roers Mobile Phone: [phone] Relation: Daughter Secondary Emergency Contact: Maxie Better Mobile Phone: [phone] Relation: Daughter  Code Status:  Full Code  Goals of care: Advanced Directive information    01/27/2023   12:59 PM  Advanced Directives  Does Patient Have a Medical Advance Directive? No     Chief Complaint  Patient presents with   Acute Visit    Right ear pain x 1 week    HPI:  Pt is a 77 y.o. female seen today for an acute visit for evaluation of right ear pain x 1 week.Feels like water in the ear.left ear recently started too.she stopped her allergy medication and alka-seltzers.Had  sneezing. Had recent sore throat but resolved. She denies any symptoms of URI, fever or chills   Past Medical History:  Diagnosis Date   Diabetes mellitus without complication (HCC)    Diabetic retinopathy (HCC)    PDR OU   Hypertension    Hypertensive retinopathy    OU   Stroke [facility]) 2019   Past Surgical History:  Procedure Laterality Date   CATARACT EXTRACTION Bilateral    COLONOSCOPY     [facility] possibly but not sure. [place] Spaniel it was negative   ESOPHAGOGASTRODUODENOSCOPY     around the same time said it was negative    EYE SURGERY Bilateral    Cat Sx OU   GALLBLADDER SURGERY      Allergies  Allergen Reactions   Penicillins     [place] Spaniel she was young had a reaction    Sulfa Antibiotics Other (See Comments)    Pt unable to report reaction Said she had a rash    Outpatient Encounter Medications as of 01/27/2023  Medication Sig   acetaminophen (TYLENOL) 500 MG tablet Take 500 mg by mouth in the morning and at bedtime.   amLODipine (NORVASC) 5 MG tablet TAKE 1 TABLET(5 MG) BY MOUTH DAILY   Apoaequorin (PREVAGEN  PO) Take 50 mcg by mouth daily.   atorvastatin (LIPITOR) 40 MG tablet TAKE 1 TABLET(40 MG) BY MOUTH DAILY   Blood Glucose Monitoring Suppl (ONE TOUCH ULTRA 2) w/Device KIT by Does not apply route.   cimetidine (TAGAMET) 300 MG tablet TAKE 1 TABLET(300 MG) BY MOUTH AT BEDTIME   clopidogrel (PLAVIX) 75 MG tablet TAKE 1 TABLET(75 MG) BY MOUTH DAILY   docusate sodium (COLACE) 100 MG capsule TAKE 1 CAPSULE(100 MG) BY MOUTH DAILY   ferrous sulfate 325 (65 FE) MG tablet Take 1 tablet (325 mg total) by mouth 2 (two) times daily with a meal.   gabapentin (NEURONTIN) 300 MG capsule TAKE 1 CAPSULE BY MOUTH AT NIGHT FOR 1 WEEK. INCREASE TO 1 CAPSULE 2 TIMES DAILY AS TOLERATED   glucose blood (ONETOUCH ULTRA) test strip TEST UP TO 4 TIMES A DAY   Glycerin-Polysorbate 80 (REFRESH DRY EYE THERAPY OP) Place 1-2 drops into both eyes as needed (for dryness).    insulin glargine (LANTUS SOLOSTAR) 100 UNIT/ML Solostar Pen ADMINISTER 44 UNITS UNDER THE SKIN DAILY   insulin lispro (HUMALOG KWIKPEN) 100 UNIT/ML KwikPen ADMINISTER 0 TO 15 UNITS UNDER THE SKIN INTO SKIN THREE TIMES DAILY   Insulin Pen Needle (BD PEN NEEDLE NANO 2ND GEN) 32G X 4 MM MISC 1 Device by Other route 4 (four) times daily. E11.40  Insulin Syringe-Needle U-100 (INSULIN SYRINGE 1CC/31GX5/16") 31G X 5/16" 1 ML MISC USE AS DIRECTED WITH LANTUS   losartan (COZAAR) 25 MG tablet Take 1 tablet (25 mg total) by mouth daily.   Menthol, Topical Analgesic, (BIOFREEZE) 4 % GEL Apply 1 Application topically every 6 (six) hours as needed.   Multiple Vitamins-Minerals (MULTIVITAMIN ADULT PO) Take 1 tablet by mouth daily.   NON FORMULARY Omega XL supplement twice daily.   nystatin cream (MYCOSTATIN) Apply 1 Application topically 2 (two) times daily.   VITAMIN D PO Take 50 mg by mouth daily.   No facility-administered encounter medications on file as of 01/27/2023.    Review of Systems  Constitutional:  Negative for appetite change, chills, fatigue, fever and  unexpected weight change.  HENT:  Positive for ear pain. Negative for congestion, dental problem, ear discharge, facial swelling, hearing loss, nosebleeds, postnasal drip, rhinorrhea, sinus pressure, sinus pain, sneezing, sore throat, tinnitus and trouble swallowing.   Eyes:  Negative for pain, discharge, redness, itching and visual disturbance.  Respiratory:  Negative for cough, chest tightness, shortness of breath and wheezing.   Cardiovascular:  Negative for chest pain, palpitations and leg swelling.  Skin:  Negative for color change, pallor, rash and wound.  Neurological:  Negative for dizziness, light-headedness and headaches.    Immunization History  Administered Date(s) Administered   Influenza, High Dose Seasonal PF 03/07/2018   Influenza,inj,Quad PF,6+ Mos 01/31/2019   PFIZER(Purple Top)SARS-COV-2 Vaccination 10/03/2019, 10/29/2019   Pertinent  Health Maintenance Due  Topic Date Due   DEXA SCAN  Never done   FOOT EXAM  10/27/2022   HEMOGLOBIN A1C  04/28/2023   OPHTHALMOLOGY EXAM  06/24/2023   MAMMOGRAM  08/19/2023   INFLUENZA VACCINE  Discontinued   Colonoscopy  Discontinued      04/28/2022    1:15 PM 09/09/2022    2:56 PM 09/26/2022    1:20 PM 11/07/2022    1:38 PM 01/27/2023    1:00 PM  Fall Risk  Falls in the past year? 0 0 0 0 0  Was there an injury with Fall? 0 0 0 0   Fall Risk Category Calculator 0 0 0 0   Fall Risk Category (Retired) Low      Patient at Risk for Falls Due to History of fall(s) No Fall Risks No Fall Risks No Fall Risks   Fall risk Follow up Falls evaluation completed   Falls evaluation completed    Functional Status Survey:    Vitals:   01/27/23 1301  BP: 130/74  Pulse: 92  Temp: (!) 97.1 F (36.2 C)  SpO2: 99%  Weight: 205 lb (93 kg)  Height: 5\' 7"  (1.702 m)   Body mass index is 32.11 kg/m. Physical Exam Vitals reviewed.  Constitutional:      General: She is not in acute distress.    Appearance: Normal appearance. She is normal  weight. She is not ill-appearing or diaphoretic.  HENT:     Head: Normocephalic.     Right Ear: No decreased hearing noted. No drainage or swelling. No middle ear effusion. There is no impacted cerumen. Tympanic membrane is erythematous. Tympanic membrane is not bulging.     Left Ear: Tympanic membrane, ear canal and external ear normal. There is no impacted cerumen.     Nose: Nose normal. No congestion or rhinorrhea.     Mouth/Throat:     Mouth: Mucous membranes are moist.     Pharynx: Oropharynx is clear. No oropharyngeal exudate or posterior  oropharyngeal erythema.  Eyes:     General: No scleral icterus.       Right eye: No discharge.        Left eye: No discharge.     Extraocular Movements: Extraocular movements intact.     Conjunctiva/sclera: Conjunctivae normal.     Pupils: Pupils are equal, round, and reactive to light.  Cardiovascular:     Rate and Rhythm: Normal rate and regular rhythm.     Pulses: Normal pulses.     Heart sounds: Normal heart sounds. No murmur heard.    No friction rub. No gallop.  Pulmonary:     Effort: Pulmonary effort is normal. No respiratory distress.     Breath sounds: Normal breath sounds. No wheezing, rhonchi or rales.  Chest:     Chest wall: No tenderness.  Musculoskeletal:     Cervical back: Normal range of motion. No rigidity or tenderness.  Lymphadenopathy:     Cervical: No cervical adenopathy.  Skin:    General: Skin is warm and dry.     Coloration: Skin is not pale.     Findings: No erythema or rash.  Neurological:     Mental Status: She is alert and oriented to person, place, and time.     Gait: Gait normal.  Psychiatric:        Mood and Affect: Mood normal.        Speech: Speech normal.        Behavior: Behavior normal.     Labs reviewed: Recent Labs    03/23/22 0824 10/26/22 1102  NA 139 140  K 4.0 4.3  CL 104 108  CO2 26 26  GLUCOSE 206* 116*  BUN 25 22  CREATININE 1.32* 1.30*  CALCIUM 9.4 9.0   Recent Labs     10/26/22 1102  AST 17  ALT 13  BILITOT 0.3  PROT 7.1   Recent Labs    03/23/22 0824 10/26/22 1102  WBC 5.8 5.2  NEUTROABS 2,772 2,475  HGB 10.2* 10.1*  HCT 30.7* 31.5*  MCV 84.6 85.6  PLT 405* 456*   Lab Results  Component Value Date   TSH 1.05 10/26/2022   Lab Results  Component Value Date   HGBA1C 7.9 (H) 10/26/2022   Lab Results  Component Value Date   CHOL 118 10/26/2022   HDL 41 (L) 10/26/2022   LDLCALC 61 10/26/2022   LDLDIRECT 54.0 08/10/2021   TRIG 79 10/26/2022   CHOLHDL 2.9 10/26/2022    Significant Diagnostic Results in last 30 days:  No results found.  Assessment/Plan  Acute otitis media with effusion of right ear Afebrile  TM erythema with effusion noted  - Advised to take Tylenol as needed  - loratadine  - doxycycline (VIBRA-TABS) 100 MG tablet; Take 1 tablet (100 mg total) by mouth 2 (two) times daily for 7 days.  Dispense: 14 tablet; Refill: 0 - Notify provider if symptoms worsen or fail to improve   Family/ staff Communication: Reviewed plan of care with patient verbalized understanding   Labs/tests ordered: None   Next Appointment: Return if symptoms worsen or fail to improve.   Caesar Bookman, NP

## 2023-02-06 ENCOUNTER — Other Ambulatory Visit: Payer: Self-pay | Admitting: Family

## 2023-02-22 ENCOUNTER — Ambulatory Visit (INDEPENDENT_AMBULATORY_CARE_PROVIDER_SITE_OTHER): Payer: 59 | Admitting: Family

## 2023-02-22 ENCOUNTER — Encounter: Payer: Self-pay | Admitting: Family

## 2023-02-22 VITALS — BP 130/70 | HR 96 | Temp 98.4°F | Resp 14 | Ht 67.0 in | Wt 208.6 lb

## 2023-02-22 DIAGNOSIS — H9311 Tinnitus, right ear: Secondary | ICD-10-CM

## 2023-02-22 DIAGNOSIS — R000 Tachycardia, unspecified: Secondary | ICD-10-CM

## 2023-02-22 DIAGNOSIS — I10 Essential (primary) hypertension: Secondary | ICD-10-CM

## 2023-02-22 DIAGNOSIS — H9191 Unspecified hearing loss, right ear: Secondary | ICD-10-CM | POA: Diagnosis not present

## 2023-02-22 NOTE — Progress Notes (Signed)
Provider: Richarda Blade FNP-C  Andrian Urbach, [name], NP  Patient Care Team: Elvira Langston, [name], NP as PCP - General (Family Medicine) [facility]  Extended Emergency Contact Information Primary Emergency Contact: Lucretia Roers Mobile Phone: [phone] Relation: Daughter Secondary Emergency Contact: Maxie Better Mobile Phone: [phone] Relation: Daughter  Code Status:  Full Code  Goals of care: Advanced Directive information    01/27/2023   12:59 PM  Advanced Directives  Does Patient Have a Medical Advance Directive? No     Chief Complaint  Patient presents with   Acute Visit    Patient complains of right ear pressure and decreased hearing.  Was seen 2 weeks ago with infection    HPI:  Pt is a 77 y.o. female seen today for an acute visit for evaluation of right ear pressure and decreased hearing x 2 weeks.states took antibiotics and the pain went away.states hears her heart beat despite trying to ignore. Sometimes Heart rate is loud in the right ear even when she has the TV volume loud. She denies any chest pain or shortness of breath.Also denies any fever,chills or drainage from the ear.  Past Medical History:  Diagnosis Date   Diabetes mellitus without complication (HCC)    Diabetic retinopathy (HCC)    PDR OU   Hypertension    Hypertensive retinopathy    OU   Stroke [facility]) 2019   Past Surgical History:  Procedure Laterality Date   CATARACT EXTRACTION Bilateral    COLONOSCOPY     [facility] possibly but not sure. [place] Spaniel it was negative   ESOPHAGOGASTRODUODENOSCOPY     around the same time said it was negative    EYE SURGERY Bilateral    Cat Sx OU   GALLBLADDER SURGERY      Allergies  Allergen Reactions   Penicillins     [place] Spaniel she was young had a reaction    Sulfa Antibiotics Other (See Comments)    Pt unable to report reaction Said she had a rash    Outpatient Encounter Medications as of 02/22/2023  Medication Sig    acetaminophen (TYLENOL) 500 MG tablet Take 500 mg by mouth in the morning and at bedtime.   amLODipine (NORVASC) 5 MG tablet TAKE 1 TABLET(5 MG) BY MOUTH DAILY   Apoaequorin (PREVAGEN PO) Take 50 mcg by mouth daily.   atorvastatin (LIPITOR) 40 MG tablet TAKE 1 TABLET(40 MG) BY MOUTH DAILY   Blood Glucose Monitoring Suppl (ONE TOUCH ULTRA 2) w/Device KIT by Does not apply route.   cimetidine (TAGAMET) 300 MG tablet TAKE 1 TABLET(300 MG) BY MOUTH AT BEDTIME   clopidogrel (PLAVIX) 75 MG tablet TAKE 1 TABLET(75 MG) BY MOUTH DAILY   docusate sodium (COLACE) 100 MG capsule TAKE 1 CAPSULE(100 MG) BY MOUTH DAILY   ferrous sulfate 325 (65 FE) MG tablet Take 1 tablet (325 mg total) by mouth 2 (two) times daily with a meal.   gabapentin (NEURONTIN) 300 MG capsule TAKE 1 CAPSULE BY MOUTH AT NIGHT FOR 1 WEEK. INCREASE TO 1 CAPSULE 2 TIMES DAILY AS TOLERATED   glucose blood (ONETOUCH ULTRA) test strip TEST UP TO 4 TIMES A DAY   Glycerin-Polysorbate 80 (REFRESH DRY EYE THERAPY OP) Place 1-2 drops into both eyes as needed (for dryness).    insulin glargine (LANTUS SOLOSTAR) 100 UNIT/ML Solostar Pen ADMINISTER 44 UNITS UNDER THE SKIN DAILY   insulin lispro (HUMALOG KWIKPEN) 100 UNIT/ML KwikPen ADMINISTER 0 TO 15 UNITS UNDER THE SKIN INTO SKIN THREE  TIMES DAILY   Insulin Pen Needle (BD PEN NEEDLE NANO 2ND GEN) 32G X 4 MM MISC 1 Device by Other route 4 (four) times daily. E11.40   Insulin Syringe-Needle U-100 (INSULIN SYRINGE 1CC/31GX5/16") 31G X 5/16" 1 ML MISC USE AS DIRECTED WITH LANTUS   loratadine (CLARITIN) 10 MG tablet Take 1 tablet (10 mg total) by mouth daily.   losartan (COZAAR) 25 MG tablet TAKE 1 TABLET(25 MG) BY MOUTH DAILY   Menthol, Topical Analgesic, (BIOFREEZE) 4 % GEL Apply 1 Application topically every 6 (six) hours as needed.   Multiple Vitamins-Minerals (MULTIVITAMIN ADULT PO) Take 1 tablet by mouth daily.   NON FORMULARY Omega XL supplement twice daily.   nystatin cream (MYCOSTATIN) Apply 1  Application topically 2 (two) times daily.   VITAMIN D PO Take 50 mg by mouth daily.   No facility-administered encounter medications on file as of 02/22/2023.    Review of Systems  Constitutional:  Negative for appetite change, chills, fatigue, fever and unexpected weight change.  HENT:  Negative for congestion, dental problem, ear discharge, ear pain, facial swelling, hearing loss, nosebleeds, postnasal drip, rhinorrhea, sinus pressure, sinus pain, sneezing, sore throat, tinnitus and trouble swallowing.        Decreased hearing   Respiratory:  Negative for cough, chest tightness, shortness of breath and wheezing.   Cardiovascular:  Positive for palpitations. Negative for chest pain and leg swelling.  Gastrointestinal:  Negative for abdominal distention, abdominal pain, constipation, nausea and vomiting.  Skin:  Negative for color change, pallor, rash and wound.  Neurological:  Negative for dizziness, syncope, speech difficulty, weakness, light-headedness, numbness and headaches.    Immunization History  Administered Date(s) Administered   Influenza, High Dose Seasonal PF 03/07/2018   Influenza,inj,Quad PF,6+ Mos 01/31/2019   PFIZER(Purple Top)SARS-COV-2 Vaccination 10/03/2019, 10/29/2019   Pertinent  Health Maintenance Due  Topic Date Due   DEXA SCAN  Never done   FOOT EXAM  10/27/2022   HEMOGLOBIN A1C  04/28/2023   OPHTHALMOLOGY EXAM  06/24/2023   MAMMOGRAM  08/19/2023   INFLUENZA VACCINE  Discontinued   Colonoscopy  Discontinued      09/09/2022    2:56 PM 09/26/2022    1:20 PM 11/07/2022    1:38 PM 01/27/2023    1:00 PM 02/22/2023   10:46 AM  Fall Risk  Falls in the past year? 0 0 0 0 0  Was there an injury with Fall? 0 0 0    Fall Risk Category Calculator 0 0 0    Patient at Risk for Falls Due to No Fall Risks No Fall Risks No Fall Risks    Fall risk Follow up   Falls evaluation completed     Functional Status Survey:    Vitals:   02/22/23 1046  BP: 130/70  Pulse:  96  Resp: 14  Temp: 98.4 F (36.9 C)  SpO2: 95%  Weight: 208 lb 9.6 oz (94.6 kg)  Height: 5\' 7"  (1.702 m)   Body mass index is 32.67 kg/m. Physical Exam Vitals reviewed.  Constitutional:      General: She is not in acute distress.    Appearance: Normal appearance. She is normal weight. She is not ill-appearing or diaphoretic.  HENT:     Head: Normocephalic.     Right Ear: Tympanic membrane, ear canal and external ear normal. There is no impacted cerumen.     Left Ear: Tympanic membrane, ear canal and external ear normal. There is no impacted cerumen.  Nose: Nose normal. No congestion or rhinorrhea.     Mouth/Throat:     Mouth: Mucous membranes are moist.     Pharynx: Oropharynx is clear. No oropharyngeal exudate or posterior oropharyngeal erythema.  Eyes:     General: No scleral icterus.       Right eye: No discharge.        Left eye: No discharge.     Extraocular Movements: Extraocular movements intact.     Conjunctiva/sclera: Conjunctivae normal.     Pupils: Pupils are equal, round, and reactive to light.  Neck:     Vascular: No carotid bruit.  Cardiovascular:     Rate and Rhythm: Normal rate and regular rhythm.     Pulses: Normal pulses.     Heart sounds: Normal heart sounds. No murmur heard.    No friction rub. No gallop.  Pulmonary:     Effort: Pulmonary effort is normal. No respiratory distress.     Breath sounds: Normal breath sounds. No wheezing, rhonchi or rales.  Chest:     Chest wall: No tenderness.  Abdominal:     General: Bowel sounds are normal. There is no distension.     Palpations: Abdomen is soft. There is no mass.     Tenderness: There is no abdominal tenderness. There is no guarding or rebound.  Musculoskeletal:        General: No swelling or tenderness. Normal range of motion.     Cervical back: Normal range of motion. No rigidity or tenderness.     Right lower leg: No edema.     Left lower leg: No edema.  Lymphadenopathy:     Cervical: No  cervical adenopathy.  Skin:    General: Skin is warm and dry.     Coloration: Skin is not pale.     Findings: No bruising or erythema.  Neurological:     Mental Status: She is alert and oriented to person, place, and time.     Cranial Nerves: No cranial nerve deficit.     Sensory: No sensory deficit.     Motor: No weakness.     Coordination: Coordination normal.     Gait: Gait abnormal.  Psychiatric:        Mood and Affect: Mood normal.        Speech: Speech normal.        Behavior: Behavior normal.    Labs reviewed: Recent Labs    03/23/22 0824 10/26/22 1102  NA 139 140  K 4.0 4.3  CL 104 108  CO2 26 26  GLUCOSE 206* 116*  BUN 25 22  CREATININE 1.32* 1.30*  CALCIUM 9.4 9.0   Recent Labs    10/26/22 1102  AST 17  ALT 13  BILITOT 0.3  PROT 7.1   Recent Labs    03/23/22 0824 10/26/22 1102  WBC 5.8 5.2  NEUTROABS 2,772 2,475  HGB 10.2* 10.1*  HCT 30.7* 31.5*  MCV 84.6 85.6  PLT 405* 456*   Lab Results  Component Value Date   TSH 1.05 10/26/2022   Lab Results  Component Value Date   HGBA1C 7.9 (H) 10/26/2022   Lab Results  Component Value Date   CHOL 118 10/26/2022   HDL 41 (L) 10/26/2022   LDLCALC 61 10/26/2022   LDLDIRECT 54.0 08/10/2021   TRIG 79 10/26/2022   CHOLHDL 2.9 10/26/2022    Significant Diagnostic Results in last 30 days:  No results found.  Assessment/Plan 1. Decreased hearing, right TM clear  -  Will refer to ENT for further evaluation - Ambulatory referral to ENT  2. Tachycardia Heart rate 96 - EKG 12-Lead EKG indicates normal sinus rhythm heart rate 84 bpm no changes compared to previous EKGs.  3. Essential hypertension Blood pressure well-controlled -Continue on losartan and amlodipine -Dietary modification and exercise advised  4. Subjective tinnitus of right ear Advised to avoid being in a quiet environment.   - Ambulatory referral to ENT  Family/ staff Communication: Reviewed plan of care with patient verbalized  understanding   Labs/tests ordered:  - EKG 12-Lead  Next Appointment: Return if symptoms worsen or fail to improve.   Caesar Bookman, NP

## 2023-04-12 ENCOUNTER — Encounter (INDEPENDENT_AMBULATORY_CARE_PROVIDER_SITE_OTHER): Payer: Self-pay

## 2023-04-12 ENCOUNTER — Ambulatory Visit (INDEPENDENT_AMBULATORY_CARE_PROVIDER_SITE_OTHER): Payer: 59 | Admitting: Audiology

## 2023-04-12 ENCOUNTER — Ambulatory Visit (INDEPENDENT_AMBULATORY_CARE_PROVIDER_SITE_OTHER): Payer: 59 | Admitting: Otolaryngology

## 2023-04-12 DIAGNOSIS — H6991 Unspecified Eustachian tube disorder, right ear: Secondary | ICD-10-CM | POA: Diagnosis not present

## 2023-04-12 DIAGNOSIS — H90A31 Mixed conductive and sensorineural hearing loss, unilateral, right ear with restricted hearing on the contralateral side: Secondary | ICD-10-CM | POA: Diagnosis not present

## 2023-04-12 DIAGNOSIS — H90A22 Sensorineural hearing loss, unilateral, left ear, with restricted hearing on the contralateral side: Secondary | ICD-10-CM

## 2023-04-12 DIAGNOSIS — J3089 Other allergic rhinitis: Secondary | ICD-10-CM

## 2023-04-12 DIAGNOSIS — H918X3 Other specified hearing loss, bilateral: Secondary | ICD-10-CM

## 2023-04-12 DIAGNOSIS — H65491 Other chronic nonsuppurative otitis media, right ear: Secondary | ICD-10-CM | POA: Diagnosis not present

## 2023-04-12 MED ORDER — FLUTICASONE PROPIONATE 50 MCG/ACT NA SUSP: 2.0000 | Freq: Two times a day (BID) | NASAL | 6 refills | Status: DC

## 2023-04-12 NOTE — Patient Instructions (Signed)
Use flonase spray two sprays twice per day for 1 month until you see Dr. Allena Katz back

## 2023-04-12 NOTE — Progress Notes (Signed)
  [address], [address] [place], [place] [address] [phone]  Audiological Evaluation    Name: Marleina Metter     DOB:   Jul 06, 1945      MRN:   [id number]                                                                                     Service Date: 04/12/2023        Patient was referred today for a hearing evaluation by Dr. Allena Katz.  Symptoms Yes Details  Hearing loss  [x]  Patient reported perceiving fullness in both of her ears and right ear hearing loss.  Tinnitus  [x]  Patient reported experiencing intermittent tinnitus in both ears.  Previous ear surgeries  []  Patient denied any previous ear surgeries.  Family history  []  Patient denied family history of hearing loss.  Amplification  []  Patient denied the use of hearing aids.    Otoscopy: Right ear: Clear external ear canal; notable landmarks visualized on the tympanic membrane. Left ear: Clear external ear canal; notable landmarks visualized on the tympanic membrane.    Tympanogram: Right ear: Normal external ear canal volume with no middle ear pressure and tympanic membrane compliance (Type B). Left ear: Normal external ear canal volume with normal middle ear pressure and tympanic membrane compliance (Type A).    Hearing Evaluation: The audiogram was completed using conventional audiometric techniques under headphones with good reliability.   The hearing test results indicate: Right ear: Moderately-severe hearing loss at 250 Hz sloping to profound mixed hearing loss from [phone] Hz. Left ear: Mild sensorineural hearing loss from [phone] Hz sloping to moderately-severe sensorineural hearing loss from 4000-8000 Hz.  Speech Audiometry: Right ear- Speech Reception Threshold (SRT) was obtained at 65 dBHL masked. Left ear- Speech Reception Threshold (SRT) was obtained at 35 dBHL.   Word Recognition Score Tested using NU-6 (MLV) Right ear: 80% was obtained at a presentation level of 85 dBHL with contralateral  masking which is deemed as fair understanding. Left ear: 96% was obtained at a presentation level of 75 dBHL which is deemed as excellent understanding.   Recommendations: Repeat audiogram when changes are perceived or per MD. Consider various tinnitus strategies, including the use of a noise generator, hearing aids, or tinnitus retraining therapy.   Conley Rolls Aliena Ghrist, AUD, CCC-A 04/12/23

## 2023-04-12 NOTE — Progress Notes (Signed)
Dear Dr. Elam Dutch, Here is my assessment for our mutual patient, Tara Conner. Thank you for allowing me the opportunity to care for your patient. Please do not hesitate to contact me should you have any other questions. Sincerely, Dr. Jovita Kussmaul  Otolaryngology Clinic Note Referring provider: Dr. Elam Dutch HPI:  Tara Conner is a 77 y.o. female kindly referred by Dr. Elam Dutch for evaluation of right ear fullness and discomfort.  Patient reports: she had right ear pain and discomfort for about 1 week starting in August 2024. She reports that she had URI symptoms before pain. Went to PCP, She was diagnosed with AOM and prescribed doxycycline and claritin, and improved with the pain. Now having some right ear fullness and pressure and "being under water." Some intermittent non-pulsatile ringing. Hearing is down as well in that ear. Patient denies: ear pain, vertigo, drainage. Patient additionally denies: deep pain in ear canal, eustachian tube symptoms such as popping, crackling Patient also denies barotrauma, vestibular suppressant use, ototoxic medication use Prior ear surgery: no  No frequent ear infections. No sprays in the nose. No frequent sinus sinfections, nasal obstructions, neck masses, hemoptysis, weight loss, B symptoms  H&N Surgery: no Personal or FHx of bleeding dz or anesthesia difficulty: no  AP/AC: Plavix PMHx: Stroke, T2DM, HTN, CKD  Independent Review of Additional Tests or Records:  [facility] 2019: mastoids and middle ear well aerated MRI 2019: T2 without significant retrocochlear masses noted   04/12/2023 Audiogram was independently reviewed and interpreted by me and it reveals Right ear: mixed HL, moderate to moderately severe across frequencies. But does not appear significantly asymmetric; see audio note; type B tympanogram Left ear: moderate to mod-severe downsloping SNHL; type A tympanogram SNHL= Sensorineural hearing  loss    PMH/Meds/All/SocHx/FamHx/ROS:   Past Medical History:  Diagnosis Date   Diabetes mellitus without complication (HCC)    Diabetic retinopathy (HCC)    PDR OU   Hypertension    Hypertensive retinopathy    OU   Stroke (HCC) 2019     Past Surgical History:  Procedure Laterality Date   CATARACT EXTRACTION Bilateral    COLONOSCOPY     [facility] possibly but not sure. Said it was negative   ESOPHAGOGASTRODUODENOSCOPY     around the same time said it was negative    EYE SURGERY Bilateral    Cat Sx OU   GALLBLADDER SURGERY      Family History  Problem Relation Age of Onset   Diabetes Mother    Breast cancer Sister    Diabetes Sister    Diabetes Daughter    Diabetes Brother    Colon cancer Neg Hx    Esophageal cancer Neg Hx    Rectal cancer Neg Hx    Stomach cancer Neg Hx      Social Connections: Moderately Isolated (09/22/2021)   Social Connection and Isolation Panel [NHANES]    Frequency of Communication with Friends and Family: Three times a week    Frequency of Social Gatherings with Friends and Family: Three times a week    Attends Religious Services: More than 4 times per year    Active Member of Clubs or Organizations: No    Attends Banker Meetings: Never    Marital Status: Divorced      Current Outpatient Medications:    acetaminophen (TYLENOL) 500 MG tablet, Take 500 mg by mouth in the morning and at bedtime., Disp: , Rfl:    amLODipine (NORVASC) 5 MG tablet, TAKE 1 TABLET(5  MG) BY MOUTH DAILY, Disp: 90 tablet, Rfl: 1   Apoaequorin (PREVAGEN PO), Take 50 mcg by mouth daily., Disp: , Rfl:    atorvastatin (LIPITOR) 40 MG tablet, TAKE 1 TABLET(40 MG) BY MOUTH DAILY, Disp: 100 tablet, Rfl: 2   Blood Glucose Monitoring Suppl (ONE TOUCH ULTRA 2) w/Device KIT, by Does not apply route., Disp: , Rfl:    cimetidine (TAGAMET) 300 MG tablet, TAKE 1 TABLET(300 MG) BY MOUTH AT BEDTIME, Disp: 90 tablet, Rfl: 4   clopidogrel (PLAVIX) 75 MG  tablet, TAKE 1 TABLET(75 MG) BY MOUTH DAILY, Disp: 100 tablet, Rfl: 1   docusate sodium (COLACE) 100 MG capsule, TAKE 1 CAPSULE(100 MG) BY MOUTH DAILY, Disp: 90 capsule, Rfl: 1   ferrous sulfate 325 (65 FE) MG tablet, Take 1 tablet (325 mg total) by mouth 2 (two) times daily with a meal., Disp: 90 tablet, Rfl: 1   fluticasone (FLONASE) 50 MCG/ACT nasal spray, Place 2 sprays into both nostrils in the morning and at bedtime., Disp: 11 mL, Rfl: 6   gabapentin (NEURONTIN) 300 MG capsule, TAKE 1 CAPSULE BY MOUTH AT NIGHT FOR 1 WEEK. INCREASE TO 1 CAPSULE 2 TIMES DAILY AS TOLERATED, Disp: 60 capsule, Rfl: 3   glucose blood (ONETOUCH ULTRA) test strip, TEST UP TO 4 TIMES A DAY, Disp: 100 strip, Rfl: 2   Glycerin-Polysorbate 80 (REFRESH DRY EYE THERAPY OP), Place 1-2 drops into both eyes as needed (for dryness). , Disp: , Rfl:    insulin glargine (LANTUS SOLOSTAR) 100 UNIT/ML Solostar Pen, ADMINISTER 44 UNITS UNDER THE SKIN DAILY, Disp: 15 mL, Rfl: 4   insulin lispro (HUMALOG KWIKPEN) 100 UNIT/ML KwikPen, ADMINISTER 0 TO 15 UNITS UNDER THE SKIN INTO SKIN THREE TIMES DAILY, Disp: 18 mL, Rfl: 4   Insulin Pen Needle (BD PEN NEEDLE NANO 2ND GEN) 32G X 4 MM MISC, 1 Device by Other route 4 (four) times daily. E11.40, Disp: 200 each, Rfl: 1   Insulin Syringe-Needle U-100 (INSULIN SYRINGE 1CC/31GX5/16") 31G X 5/16" 1 ML MISC, USE AS DIRECTED WITH LANTUS, Disp: 100 each, Rfl: 5   loratadine (CLARITIN) 10 MG tablet, Take 1 tablet (10 mg total) by mouth daily., Disp: 30 tablet, Rfl: 11   losartan (COZAAR) 25 MG tablet, TAKE 1 TABLET(25 MG) BY MOUTH DAILY, Disp: 90 tablet, Rfl: 1   Menthol, Topical Analgesic, (BIOFREEZE) 4 % GEL, Apply 1 Application topically every 6 (six) hours as needed., Disp: 74 mL, Rfl: 0   Multiple Vitamins-Minerals (MULTIVITAMIN ADULT PO), Take 1 tablet by mouth daily., Disp: , Rfl:    NON FORMULARY, Omega XL supplement twice daily., Disp: , Rfl:    nystatin cream (MYCOSTATIN), Apply 1  Application topically 2 (two) times daily., Disp: 30 g, Rfl: 3   VITAMIN D PO, Take 50 mg by mouth daily., Disp: , Rfl:    Physical Exam:   LMP  (LMP Unknown)   Salient findings:  CN II-XII intact Left: EAC clear and TM intact with well pneumatized middle ear space Right: EAC clear; TM with small serous effusion inferiorly; slight retracation pars flaccida Weber 512: right Rinne 512: AC > BC b/l Anterior rhinoscopy: Septum relatively midline; no masses appreciate on anterior rhino; No lesions of oral cavity/oropharynx No obviously palpable neck masses/lymphadenopathy/thyromegaly No respiratory distress or stridor  Seprately Identifiable Procedures:  None today  Impression & Plans:  Tara Conner is a 77 y.o. female with h/o HTN, T2DM, CKD, prior Stroke on Plavix now with:  1. Chronic otitis media of right  ear with effusion   2. Other specified hearing loss of both ears   3. Allergic rhinitis due to other allergic trigger, unspecified seasonality   4. Dysfunction of right eustachian tube    We discussed her options today: most likely just recovering from AOM now with effusion and ETD. It's been about 8 weeks and we discussed can be up to 12 weeks for effusion to resolve. She does have some allergy symptoms, and we discussed starting nasal sprays v/s doing a myringotomy. She would prefer to try flonase, so we will initiate that until follow up.  - We also discussed amplification for her hearing loss but she wishes to defer for now  - f/u 4 weeks - if effusion still present, will consider tympanostomy tube/myringotomy. Will also consider TFL at that time given unilateral effusion  See below regarding exact medications prescribed this encounter including dosages and route: Meds ordered this encounter  Medications   fluticasone (FLONASE) 50 MCG/ACT nasal spray    Sig: Place 2 sprays into both nostrils in the morning and at bedtime.    Dispense:  11 mL    Refill:  6       Thank you for allowing me the opportunity to care for your patient. Please do not hesitate to contact me should you have any other questions.  Sincerely, Jovita Kussmaul, MD Otolarynoglogist (ENT), [facility] Phone: [phone] Fax: [phone]  04/12/2023, 1:50 PM   I have personally spent 47 minutes involved in face-to-face and non-face-to-face activities for this patient on the day of the visit.  Professional time spent includes the following activities, in addition to those noted in the documentation: preparing to see the patient (review of outside documentation and results - audio), performing a medically appropriate examination and/or evaluation, counseling and educating the patient/family/caregiver, ordering medications, referring and communicating with other healthcare professionals, documenting clinical information in the electronic or other health record, independently interpreting results and communicating results with the patient

## 2023-04-21 ENCOUNTER — Encounter: Payer: Self-pay | Admitting: Audiology

## 2023-04-21 ENCOUNTER — Telehealth: Payer: Self-pay

## 2023-04-21 DIAGNOSIS — E1140 Type 2 diabetes mellitus with diabetic neuropathy, unspecified: Secondary | ICD-10-CM

## 2023-04-21 MED ORDER — LANTUS SOLOSTAR 100 UNIT/ML ~~LOC~~ SOPN: 44.0000 [IU] | PEN_INJECTOR | Freq: Every day | SUBCUTANEOUS | 4 refills | Status: DC

## 2023-04-21 MED ORDER — INSULIN LISPRO (1 UNIT DIAL) 100 UNIT/ML (KWIKPEN): PEN_INJECTOR | SUBCUTANEOUS | 4 refills | Status: DC

## 2023-04-21 NOTE — Telephone Encounter (Signed)
Patient called and requested a rx for Humalog and lantus.

## 2023-04-29 ENCOUNTER — Other Ambulatory Visit: Payer: Self-pay | Admitting: Family

## 2023-05-08 ENCOUNTER — Other Ambulatory Visit: Payer: Self-pay

## 2023-05-08 DIAGNOSIS — N1832 Chronic kidney disease, stage 3b: Secondary | ICD-10-CM

## 2023-05-08 DIAGNOSIS — E785 Hyperlipidemia, unspecified: Secondary | ICD-10-CM

## 2023-05-08 DIAGNOSIS — E1140 Type 2 diabetes mellitus with diabetic neuropathy, unspecified: Secondary | ICD-10-CM

## 2023-05-08 DIAGNOSIS — I10 Essential (primary) hypertension: Secondary | ICD-10-CM

## 2023-05-08 DIAGNOSIS — D509 Iron deficiency anemia, unspecified: Secondary | ICD-10-CM

## 2023-05-08 DIAGNOSIS — K219 Gastro-esophageal reflux disease without esophagitis: Secondary | ICD-10-CM

## 2023-05-10 ENCOUNTER — Other Ambulatory Visit: Payer: 59

## 2023-05-10 DIAGNOSIS — D509 Iron deficiency anemia, unspecified: Secondary | ICD-10-CM | POA: Diagnosis not present

## 2023-05-10 DIAGNOSIS — K219 Gastro-esophageal reflux disease without esophagitis: Secondary | ICD-10-CM | POA: Diagnosis not present

## 2023-05-10 DIAGNOSIS — E785 Hyperlipidemia, unspecified: Secondary | ICD-10-CM | POA: Diagnosis not present

## 2023-05-10 DIAGNOSIS — I10 Essential (primary) hypertension: Secondary | ICD-10-CM | POA: Diagnosis not present

## 2023-05-10 DIAGNOSIS — E1140 Type 2 diabetes mellitus with diabetic neuropathy, unspecified: Secondary | ICD-10-CM | POA: Diagnosis not present

## 2023-05-11 ENCOUNTER — Ambulatory Visit (INDEPENDENT_AMBULATORY_CARE_PROVIDER_SITE_OTHER): Payer: 59 | Admitting: Otolaryngology

## 2023-05-11 DIAGNOSIS — H90A31 Mixed conductive and sensorineural hearing loss, unilateral, right ear with restricted hearing on the contralateral side: Secondary | ICD-10-CM | POA: Diagnosis not present

## 2023-05-11 DIAGNOSIS — H6991 Unspecified Eustachian tube disorder, right ear: Secondary | ICD-10-CM | POA: Diagnosis not present

## 2023-05-11 DIAGNOSIS — J3089 Other allergic rhinitis: Secondary | ICD-10-CM

## 2023-05-11 DIAGNOSIS — H65491 Other chronic nonsuppurative otitis media, right ear: Secondary | ICD-10-CM

## 2023-05-11 LAB — CBC WITH DIFFERENTIAL/PLATELET
Absolute Lymphocytes: 2482 {cells}/uL (ref 850–3900)
Absolute Monocytes: 528 {cells}/uL (ref 200–950)
Basophils Absolute: 59 {cells}/uL (ref 0–200)
Basophils Relative: 0.9 %
Eosinophils Absolute: 172 {cells}/uL (ref 15–500)
Eosinophils Relative: 2.6 %
HCT: 28.2 % — ABNORMAL LOW (ref 35.0–45.0)
Hemoglobin: 9 g/dL — ABNORMAL LOW (ref 11.7–15.5)
MCH: 27.3 pg (ref 27.0–33.0)
MCHC: 31.9 g/dL — ABNORMAL LOW (ref 32.0–36.0)
MCV: 85.5 fL (ref 80.0–100.0)
MPV: 9.3 fL (ref 7.5–12.5)
Monocytes Relative: 8 %
Neutro Abs: 3359 {cells}/uL (ref 1500–7800)
Neutrophils Relative %: 50.9 %
Platelets: 491 10*3/uL — ABNORMAL HIGH (ref 140–400)
RBC: 3.3 10*6/uL — ABNORMAL LOW (ref 3.80–5.10)
RDW: 13.6 % (ref 11.0–15.0)
Total Lymphocyte: 37.6 %
WBC: 6.6 10*3/uL (ref 3.8–10.8)

## 2023-05-11 LAB — COMPLETE METABOLIC PANEL WITH GFR
AG Ratio: 1.1 (calc) (ref 1.0–2.5)
ALT: 9 U/L (ref 6–29)
AST: 13 U/L (ref 10–35)
Albumin: 3.7 g/dL (ref 3.6–5.1)
Alkaline phosphatase (APISO): 98 U/L (ref 37–153)
BUN/Creatinine Ratio: 18 (calc) (ref 6–22)
BUN: 24 mg/dL (ref 7–25)
CO2: 26 mmol/L (ref 20–32)
Calcium: 9 mg/dL (ref 8.6–10.4)
Chloride: 103 mmol/L (ref 98–110)
Creat: 1.34 mg/dL — ABNORMAL HIGH (ref 0.60–1.00)
Globulin: 3.5 g/dL (ref 1.9–3.7)
Glucose, Bld: 147 mg/dL — ABNORMAL HIGH (ref 65–99)
Potassium: 4.5 mmol/L (ref 3.5–5.3)
Sodium: 137 mmol/L (ref 135–146)
Total Bilirubin: 0.2 mg/dL (ref 0.2–1.2)
Total Protein: 7.2 g/dL (ref 6.1–8.1)
eGFR: 41 mL/min/{1.73_m2} — ABNORMAL LOW (ref >=60)

## 2023-05-11 LAB — HEMOGLOBIN A1C
Hgb A1c MFr Bld: 10.7 %{Hb} — ABNORMAL HIGH (ref <5.7)
Mean Plasma Glucose: 260 mg/dL
eAG (mmol/L): 14.4 mmol/L

## 2023-05-11 LAB — LIPID PANEL
Cholesterol: 128 mg/dL (ref <200)
HDL: 42 mg/dL — ABNORMAL LOW (ref >=50)
LDL Cholesterol (Calc): 66 mg/dL
Non-HDL Cholesterol (Calc): 86 mg/dL (ref <130)
Total CHOL/HDL Ratio: 3 (calc) (ref <5.0)
Triglycerides: 115 mg/dL (ref <150)

## 2023-05-11 LAB — TSH: TSH: 1.78 m[IU]/L (ref 0.40–4.50)

## 2023-05-11 NOTE — Progress Notes (Signed)
Dear Dr. Elam Dutch, Here is my assessment for our mutual patient, Tara Conner. Thank you for allowing me the opportunity to care for your patient. Please do not hesitate to contact me should you have any other questions. Sincerely, Dr. Jovita Kussmaul  Otolaryngology Clinic Note Referring provider: Dr. Elam Dutch HPI:  Tara Conner is a 77 y.o. female kindly referred by Dr. Elam Dutch for evaluation of right ear fullness and discomfort.  Initial visit (04/12/2023): Patient reports: she had right ear pain and discomfort for about 1 week starting in August 2024. She reports that she had URI symptoms before pain. Went to PCP, She was diagnosed with AOM and prescribed doxycycline and claritin, and improved with the pain. Now having some right ear fullness and pressure and "being under water." Some intermittent non-pulsatile ringing. Hearing is down as well in that ear. Patient denies: ear pain, vertigo, drainage. Patient additionally denies: deep pain in ear canal, eustachian tube symptoms such as popping, crackling Patient also denies barotrauma, vestibular suppressant use, ototoxic medication use Prior ear surgery: no  No frequent ear infections. No sprays in the nose. No frequent sinus sinfections, nasal obstructions, neck masses, hemoptysis, weight loss, B symptoms  ----------------------------------------- Given unilateral effusion and likely recovery from AOM, we decided to do nasal sprays and observe rather than do myringotomy. She presents for follow up.  Today (05/11/2023): She reports that that she is doing much better now. Pain and discomfort have stayed away and reports that her fullness and pressure are also better. No ringing anymore. Also feels like her hearing is better, but not all the way back. No vertigo or drainage. She has been using flonase.  H&N Surgery: no Personal or FHx of bleeding dz or anesthesia difficulty: no  AP/AC: Plavix PMHx: Stroke, T2DM, HTN,  CKD  Independent Review of Additional Tests or Records:  [facility] 2019: mastoids and middle ear well aerated MRI 2019: T2 without significant retrocochlear masses noted   04/12/2023 Audiogram was independently reviewed and interpreted by me and it reveals Right ear: mixed HL, moderate to moderately severe across frequencies. But does not appear significantly asymmetric; see audio note; type B tympanogram Left ear: moderate to mod-severe downsloping SNHL; type A tympanogram   SNHL= Sensorineural hearing loss    PMH/Meds/All/SocHx/FamHx/ROS:   Past Medical History:  Diagnosis Date   Diabetes mellitus without complication (HCC)    Diabetic retinopathy (HCC)    PDR OU   Hypertension    Hypertensive retinopathy    OU   Stroke (HCC) 2019     Past Surgical History:  Procedure Laterality Date   CATARACT EXTRACTION Bilateral    COLONOSCOPY     [facility] [place] possibly but not sure. Said it was negative   ESOPHAGOGASTRODUODENOSCOPY     around the same time said it was negative    EYE SURGERY Bilateral    Cat Sx OU   GALLBLADDER SURGERY      Family History  Problem Relation Age of Onset   Diabetes Mother    Breast cancer Sister    Diabetes Sister    Diabetes Daughter    Diabetes Brother    Colon cancer Neg Hx    Esophageal cancer Neg Hx    Rectal cancer Neg Hx    Stomach cancer Neg Hx      Social Connections: Moderately Isolated (09/22/2021)   Social Connection and Isolation Panel [NHANES]    Frequency of Communication with Friends and Family: Three times a week    Frequency of Social Gatherings  with Friends and Family: Three times a week    Attends Religious Services: More than 4 times per year    Active Member of Clubs or Organizations: No    Attends Banker Meetings: Never    Marital Status: Divorced      Current Outpatient Medications:    amLODipine (NORVASC) 5 MG tablet, TAKE 1 TABLET(5 MG) BY MOUTH DAILY, Disp: 90 tablet, Rfl: 1   Apoaequorin  (PREVAGEN PO), Take 50 mcg by mouth daily., Disp: , Rfl:    atorvastatin (LIPITOR) 40 MG tablet, TAKE 1 TABLET(40 MG) BY MOUTH DAILY, Disp: 100 tablet, Rfl: 2   Blood Glucose Monitoring Suppl (ONE TOUCH ULTRA 2) w/Device KIT, by Does not apply route., Disp: , Rfl:    cimetidine (TAGAMET) 300 MG tablet, TAKE 1 TABLET(300 MG) BY MOUTH AT BEDTIME, Disp: 90 tablet, Rfl: 4   clopidogrel (PLAVIX) 75 MG tablet, TAKE 1 TABLET(75 MG) BY MOUTH DAILY, Disp: 100 tablet, Rfl: 1   docusate sodium (COLACE) 100 MG capsule, TAKE 1 CAPSULE(100 MG) BY MOUTH DAILY, Disp: 90 capsule, Rfl: 1   ferrous sulfate 325 (65 FE) MG tablet, Take 1 tablet (325 mg total) by mouth 2 (two) times daily with a meal., Disp: 90 tablet, Rfl: 1   gabapentin (NEURONTIN) 300 MG capsule, TAKE 1 CAPSULE BY MOUTH AT NIGHT FOR 1 WEEK. INCREASE TO 1 CAPSULE 2 TIMES DAILY AS TOLERATED, Disp: 60 capsule, Rfl: 3   glucose blood (ONETOUCH ULTRA) test strip, TEST UPTO FOUR TIMES DAILY, Disp: 100 strip, Rfl: 3   Glycerin-Polysorbate 80 (REFRESH DRY EYE THERAPY OP), Place 1-2 drops into both eyes as needed (for dryness). , Disp: , Rfl:    insulin glargine (LANTUS SOLOSTAR) 100 UNIT/ML Solostar Pen, Inject 44 Units into the skin daily., Disp: 15 mL, Rfl: 4   insulin lispro (HUMALOG KWIKPEN) 100 UNIT/ML KwikPen, ADMINISTER 0 TO 15 UNITS UNDER THE SKIN INTO SKIN THREE TIMES DAILY, Disp: 18 mL, Rfl: 4   Insulin Pen Needle (BD PEN NEEDLE NANO 2ND GEN) 32G X 4 MM MISC, 1 Device by Other route 4 (four) times daily. E11.40, Disp: 200 each, Rfl: 1   Insulin Syringe-Needle U-100 (INSULIN SYRINGE 1CC/31GX5/16") 31G X 5/16" 1 ML MISC, USE AS DIRECTED WITH LANTUS, Disp: 100 each, Rfl: 5   losartan (COZAAR) 25 MG tablet, TAKE 1 TABLET(25 MG) BY MOUTH DAILY, Disp: 90 tablet, Rfl: 1   Menthol, Topical Analgesic, (BIOFREEZE) 4 % GEL, Apply 1 Application topically every 6 (six) hours as needed., Disp: 74 mL, Rfl: 0   Multiple Vitamins-Minerals (MULTIVITAMIN ADULT PO),  Take 1 tablet by mouth daily., Disp: , Rfl:    NON FORMULARY, Omega XL supplement twice daily., Disp: , Rfl:    VITAMIN D PO, Take 50 mg by mouth daily., Disp: , Rfl:    acetaminophen (TYLENOL) 500 MG tablet, Take 500 mg by mouth in the morning and at bedtime. (Patient not taking: Reported on 05/11/2023), Disp: , Rfl:    fluticasone (FLONASE) 50 MCG/ACT nasal spray, Place 2 sprays into both nostrils in the morning and at bedtime. (Patient not taking: Reported on 05/11/2023), Disp: 11 mL, Rfl: 6   loratadine (CLARITIN) 10 MG tablet, Take 1 tablet (10 mg total) by mouth daily. (Patient not taking: Reported on 05/11/2023), Disp: 30 tablet, Rfl: 11   nystatin cream (MYCOSTATIN), Apply 1 Application topically 2 (two) times daily. (Patient not taking: Reported on 05/11/2023), Disp: 30 g, Rfl: 3   Physical Exam:   LMP  (  LMP Unknown)   Salient findings:  CN II-XII intact Left: EAC clear and TM intact with well pneumatized middle ear space Right: EAC clear; TM with no effusion noted today, but some retraction over pars and posterosuperior quadrant with prominent malleolar ligament Weber 512: midline but unreliable today Rinne 512: AC > BC b/l Anterior rhinoscopy: Septum relatively midline; no masses appreciate on anterior rhino; No lesions of oral cavity/oropharynx No obviously palpable neck masses/lymphadenopathy/thyromegaly No respiratory distress or stridor  Seprately Identifiable Procedures:  None today  Impression & Plans:  Hanadi Galano is a 77 y.o. female with h/o HTN, T2DM, CKD, prior Stroke on Plavix now with:  1. Mixed conductive and sensorineural hearing loss, unilateral, right ear with restricted hearing on the contralateral side   2. Chronic otitis media of right ear with effusion   3. Allergic rhinitis due to other allergic trigger, unspecified seasonality   4. Dysfunction of right eustachian tube    She is now about 12 weeks out, and has improved a fair amount on flonase.  I do continue to note some retraction, but because she is doing better, we will give it more time rather than get an audiogram today.  We will therefore continue flonase and see her back in 3-4 weeks with audiogram. If she continues to have trouble or B tymps, will consider tympanostomy tube and TFL.  F/u 3-4 weeks WITH audio prior  See below regarding exact medications prescribed this encounter including dosages and route: No orders of the defined types were placed in this encounter.     Thank you for allowing me the opportunity to care for your patient. Please do not hesitate to contact me should you have any other questions.  Sincerely, Jovita Kussmaul, MD Otolarynoglogist (ENT), [facility] Phone: [phone] Fax: [phone]  05/11/2023, 12:56 PM   MDM:  Level 3: 99213 Complexity/Problems addressed: low - multiple chronic problems Data complexity: low - Morbidity: low  - Prescription Drug prescribed or managed: no

## 2023-05-14 ENCOUNTER — Other Ambulatory Visit: Payer: Self-pay | Admitting: Family

## 2023-05-14 DIAGNOSIS — Z8673 Personal history of transient ischemic attack (TIA), and cerebral infarction without residual deficits: Secondary | ICD-10-CM

## 2023-05-15 ENCOUNTER — Other Ambulatory Visit: Payer: Self-pay | Admitting: Family

## 2023-05-15 ENCOUNTER — Ambulatory Visit: Payer: 59 | Admitting: Family

## 2023-05-15 DIAGNOSIS — Z8673 Personal history of transient ischemic attack (TIA), and cerebral infarction without residual deficits: Secondary | ICD-10-CM

## 2023-05-16 ENCOUNTER — Encounter: Payer: Self-pay | Admitting: Family

## 2023-05-16 ENCOUNTER — Ambulatory Visit (INDEPENDENT_AMBULATORY_CARE_PROVIDER_SITE_OTHER): Payer: 59 | Admitting: Family

## 2023-05-16 VITALS — BP 136/80 | HR 95 | Temp 97.6°F | Resp 17 | Ht 67.0 in | Wt 208.2 lb

## 2023-05-16 DIAGNOSIS — Z780 Asymptomatic menopausal state: Secondary | ICD-10-CM

## 2023-05-16 DIAGNOSIS — K219 Gastro-esophageal reflux disease without esophagitis: Secondary | ICD-10-CM

## 2023-05-16 DIAGNOSIS — Z794 Long term (current) use of insulin: Secondary | ICD-10-CM

## 2023-05-16 DIAGNOSIS — E785 Hyperlipidemia, unspecified: Secondary | ICD-10-CM

## 2023-05-16 DIAGNOSIS — D509 Iron deficiency anemia, unspecified: Secondary | ICD-10-CM | POA: Diagnosis not present

## 2023-05-16 DIAGNOSIS — E1140 Type 2 diabetes mellitus with diabetic neuropathy, unspecified: Secondary | ICD-10-CM | POA: Diagnosis not present

## 2023-05-16 DIAGNOSIS — M25561 Pain in right knee: Secondary | ICD-10-CM

## 2023-05-16 DIAGNOSIS — N1832 Chronic kidney disease, stage 3b: Secondary | ICD-10-CM | POA: Diagnosis not present

## 2023-05-16 DIAGNOSIS — I10 Essential (primary) hypertension: Secondary | ICD-10-CM | POA: Diagnosis not present

## 2023-05-16 NOTE — Progress Notes (Unsigned)
Provider: Richarda Blade FNP-C   Litzy Dicker, [name], NP  Patient Care Team: Seema Blum, [name], NP as PCP - General (Family Medicine) [facility]  Extended Emergency Contact Information Primary Emergency Contact: [facility] Mobile Phone: [phone] Relation: Daughter Secondary Emergency Contact: Wilson,Rachel Mobile Phone: [phone] Relation: Daughter  Code Status:  Full Code  Goals of care: Advanced Directive information    01/27/2023   12:59 PM  Advanced Directives  Does Patient Have a Medical Advance Directive? No     Chief Complaint  Patient presents with   Medical Management of Chronic Issues    6 month follow up and discuss medicare annual annual wellness visit,dexa scan,and foot exam.    HPI:  Pt is a 77 y.o. female seen today for 6 months for medical management of chronic diseases. Recent lab work reviewed and discussed during visit.  States pulsating in the ear has gotten better with Flonase twice daily.Has follow up with ENT.she denies any nasal drainage.   Type 2 DM - on Humalog 18 units with meals.usually eats two meals. Fasting blood sugars in the 140's and 200's in the evening. Recent A1C was 10.7  States has been eating more treats chocolate granola bars.   Hypertension - No home blood pressure readings for review.she denies any headache,dizziness,vision changes,fatigue,chest tightness,palpitation,chest pain or shortness of breath.     Right knee pain - pain worst when going up the stairs x 1 week.No pain with rest.         Past Medical History:  Diagnosis Date   Diabetes mellitus without complication (HCC)    Diabetic retinopathy (HCC)    PDR OU   Hypertension    Hypertensive retinopathy    OU   Stroke [facility]) 2019   Past Surgical History:  Procedure Laterality Date   CATARACT EXTRACTION Bilateral    COLONOSCOPY     [facility] possibly but not sure. [place] Sp[facility]iel it was negative   ESOPHAGOGASTRODUODENOSCOPY     around  the same time said it was negative    EYE SURGERY Bilateral    Cat Sx OU   GALLBLADDER SURGERY      Allergies  Allergen Reactions   Penicillins     [place] Spaniel she was young had a reaction    Sulfa Antibiotics Other (See Comments)    Pt unable to report reaction Said she had a rash    Allergies as of 05/16/2023       Reactions   Penicillins    [place] Spaniel she was young had a reaction    Sulfa Antibiotics Other (See Comments)   Pt unable to report reaction Said she had a rash        Medication List        Accurate as of May 16, 2023  2:43 PM. If you have any questions, ask your nurse or doctor.          acetaminophen 500 MG tablet Commonly known as: TYLENOL Take 500 mg by mouth in the morning and at bedtime.   amLODipine 5 MG tablet Commonly known as: NORVASC TAKE 1 TABLET(5 MG) BY MOUTH DAILY   atorvastatin 40 MG tablet Commonly known as: LIPITOR TAKE 1 TABLET(40 MG) BY MOUTH DAILY   BD Pen Needle Nano 2nd Gen 32G X 4 MM Misc Generic drug: Insulin Pen Needle 1 Device by Other route 4 (four) times daily. E11.40   Biofreeze 4 % Gel Generic drug: Menthol (Topical Analgesic) Apply 1 Application topically every 6 (six) hours  as needed.   cimetidine 300 MG tablet Commonly known as: TAGAMET TAKE 1 TABLET(300 MG) BY MOUTH AT BEDTIME   clopidogrel 75 MG tablet Commonly known as: PLAVIX TAKE 1 TABLET(75 MG) BY MOUTH DAILY   docusate sodium 100 MG capsule Commonly known as: COLACE TAKE 1 CAPSULE(100 MG) BY MOUTH DAILY   ferrous sulfate 325 (65 FE) MG tablet Take 1 tablet (325 mg total) by mouth 2 (two) times daily with a meal.   fluticasone 50 MCG/ACT nasal spray Commonly known as: FLONASE Place 2 sprays into both nostrils in the morning and at bedtime.   gabapentin 300 MG capsule Commonly known as: NEURONTIN TAKE 1 CAPSULE BY MOUTH AT NIGHT FOR 1 WEEK. INCREASE TO 1 CAPSULE 2 TIMES DAILY AS TOLERATED   insulin lispro 100 UNIT/ML KwikPen Commonly known as:  HumaLOG KwikPen ADMINISTER 0 TO 15 UNITS UNDER THE SKIN INTO SKIN THREE TIMES DAILY   INSULIN SYRINGE 1CC/31GX5/16" 31G X 5/16" 1 ML Misc USE AS DIRECTED WITH LANTUS   Lantus SoloStar 100 UNIT/ML Solostar Pen Generic drug: insulin glargine Inject 44 Units into the skin daily.   loratadine 10 MG tablet Commonly known as: CLARITIN Take 1 tablet (10 mg total) by mouth daily.   losartan 25 MG tablet Commonly known as: COZAAR TAKE 1 TABLET(25 MG) BY MOUTH DAILY   MULTIVITAMIN ADULT PO Take 1 tablet by mouth daily.   NON FORMULARY Omega XL supplement twice daily.   nystatin cream Commonly known as: MYCOSTATIN Apply 1 Application topically 2 (two) times daily.   ONE TOUCH ULTRA 2 w/Device Kit by Does not apply route.   OneTouch Ultra test strip Generic drug: glucose blood TEST UPTO FOUR TIMES DAILY   PREVAGEN PO Take 50 mcg by mouth daily.   REFRESH DRY EYE THERAPY OP Place 1-2 drops into both eyes as needed (for dryness).   VITAMIN D PO Take 50 mg by mouth daily.        Review of Systems  Immunization History  Administered Date(s) Administered   Influenza, High Dose Seasonal PF 03/07/2018   Influenza,inj,Quad PF,6+ Mos 01/31/2019   PFIZER(Purple Top)SARS-COV-2 Vaccination 10/03/2019, 10/29/2019   Pertinent  Health Maintenance Due  Topic Date Due   DEXA SCAN  Never done   FOOT EXAM  10/27/2022   OPHTHALMOLOGY EXAM  06/24/2023   MAMMOGRAM  08/19/2023   HEMOGLOBIN A1C  11/08/2023   INFLUENZA VACCINE  Discontinued   Colonoscopy  Discontinued      09/09/2022    2:56 PM 09/26/2022    1:20 PM 11/07/2022    1:38 PM 01/27/2023    1:00 PM 02/22/2023   10:46 AM  Fall Risk  Falls in the past year? 0 0 0 0 0  Was there an injury with Fall? 0 0 0    Fall Risk Category Calculator 0 0 0    Patient at Risk for Falls Due to No Fall Risks No Fall Risks No Fall Risks    Fall risk Follow up   Falls evaluation completed     Functional Status Survey:    There were no  vitals filed for this visit. There is no height or weight on file to calculate BMI. Physical Exam  Labs reviewed: Recent Labs    10/26/22 1102 05/10/23 0804  NA 140 137  K 4.3 4.5  CL 108 103  CO2 26 26  GLUCOSE 116* 147*  BUN 22 24  CREATININE 1.30* 1.34*  CALCIUM 9.0 9.0   Recent Labs  10/26/22 1102 05/10/23 0804  AST 17 13  ALT 13 9  BILITOT 0.3 0.2  PROT 7.1 7.2   Recent Labs    10/26/22 1102 05/10/23 0804  WBC 5.2 6.6  NEUTROABS 2,475 3,359  HGB 10.1* 9.0*  HCT 31.5* 28.2*  MCV 85.6 85.5  PLT 456* 491*   Lab Results  Component Value Date   TSH 1.78 05/10/2023   Lab Results  Component Value Date   HGBA1C 10.7 (H) 05/10/2023   Lab Results  Component Value Date   CHOL 128 05/10/2023   HDL 42 (L) 05/10/2023   LDLCALC 66 05/10/2023   LDLDIRECT 54.0 08/10/2021   TRIG 115 05/10/2023   CHOLHDL 3.0 05/10/2023    Significant Diagnostic Results in last 30 days:  No results found.  Assessment/Plan There are no diagnoses linked to this encounter.   Family/ staff Communication: Reviewed plan of care with patient  Labs/tests ordered: None   Next Appointment :   Caesar Bookman, NP

## 2023-05-16 NOTE — Patient Instructions (Signed)
-   Please get right knee  X-ray at [facility] then will call you with results.

## 2023-05-17 ENCOUNTER — Other Ambulatory Visit: Payer: Self-pay

## 2023-05-17 DIAGNOSIS — E1140 Type 2 diabetes mellitus with diabetic neuropathy, unspecified: Secondary | ICD-10-CM

## 2023-05-17 DIAGNOSIS — D509 Iron deficiency anemia, unspecified: Secondary | ICD-10-CM

## 2023-05-17 DIAGNOSIS — E785 Hyperlipidemia, unspecified: Secondary | ICD-10-CM

## 2023-05-17 DIAGNOSIS — I10 Essential (primary) hypertension: Secondary | ICD-10-CM

## 2023-05-17 DIAGNOSIS — N1832 Chronic kidney disease, stage 3b: Secondary | ICD-10-CM

## 2023-05-25 ENCOUNTER — Other Ambulatory Visit: Payer: Self-pay | Admitting: Family Medicine

## 2023-05-25 DIAGNOSIS — E1140 Type 2 diabetes mellitus with diabetic neuropathy, unspecified: Secondary | ICD-10-CM

## 2023-06-07 ENCOUNTER — Other Ambulatory Visit: Payer: 59

## 2023-06-07 DIAGNOSIS — D509 Iron deficiency anemia, unspecified: Secondary | ICD-10-CM | POA: Diagnosis not present

## 2023-06-07 DIAGNOSIS — E785 Hyperlipidemia, unspecified: Secondary | ICD-10-CM | POA: Diagnosis not present

## 2023-06-07 DIAGNOSIS — E1140 Type 2 diabetes mellitus with diabetic neuropathy, unspecified: Secondary | ICD-10-CM | POA: Diagnosis not present

## 2023-06-07 DIAGNOSIS — I10 Essential (primary) hypertension: Secondary | ICD-10-CM | POA: Diagnosis not present

## 2023-06-07 DIAGNOSIS — N1832 Chronic kidney disease, stage 3b: Secondary | ICD-10-CM | POA: Diagnosis not present

## 2023-06-08 ENCOUNTER — Ambulatory Visit (INDEPENDENT_AMBULATORY_CARE_PROVIDER_SITE_OTHER): Payer: 59

## 2023-06-08 LAB — LIPID PANEL
Cholesterol: 135 mg/dL (ref <200)
HDL: 45 mg/dL — ABNORMAL LOW (ref >=50)
LDL Cholesterol (Calc): 72 mg/dL
Non-HDL Cholesterol (Calc): 90 mg/dL (ref <130)
Total CHOL/HDL Ratio: 3 (calc) (ref <5.0)
Triglycerides: 101 mg/dL (ref <150)

## 2023-06-08 LAB — CBC WITH DIFFERENTIAL/PLATELET
Absolute Lymphocytes: 1988 {cells}/uL (ref 850–3900)
Absolute Monocytes: 594 {cells}/uL (ref 200–950)
Basophils Absolute: 80 {cells}/uL (ref 0–200)
Basophils Relative: 1.5 %
Eosinophils Absolute: 133 {cells}/uL (ref 15–500)
Eosinophils Relative: 2.5 %
HCT: 31.3 % — ABNORMAL LOW (ref 35.0–45.0)
Hemoglobin: 10 g/dL — ABNORMAL LOW (ref 11.7–15.5)
MCH: 26.8 pg — ABNORMAL LOW (ref 27.0–33.0)
MCHC: 31.9 g/dL — ABNORMAL LOW (ref 32.0–36.0)
MCV: 83.9 fL (ref 80.0–100.0)
MPV: 9.5 fL (ref 7.5–12.5)
Monocytes Relative: 11.2 %
Neutro Abs: 2507 {cells}/uL (ref 1500–7800)
Neutrophils Relative %: 47.3 %
Platelets: 504 10*3/uL — ABNORMAL HIGH (ref 140–400)
RBC: 3.73 10*6/uL — ABNORMAL LOW (ref 3.80–5.10)
RDW: 13.6 % (ref 11.0–15.0)
Total Lymphocyte: 37.5 %
WBC: 5.3 10*3/uL (ref 3.8–10.8)

## 2023-06-08 LAB — COMPLETE METABOLIC PANEL WITH GFR
AG Ratio: 1.2 (calc) (ref 1.0–2.5)
ALT: 9 U/L (ref 6–29)
AST: 17 U/L (ref 10–35)
Albumin: 4.1 g/dL (ref 3.6–5.1)
Alkaline phosphatase (APISO): 96 U/L (ref 37–153)
BUN/Creatinine Ratio: 22 (calc) (ref 6–22)
BUN: 25 mg/dL (ref 7–25)
CO2: 24 mmol/L (ref 20–32)
Calcium: 9.8 mg/dL (ref 8.6–10.4)
Chloride: 104 mmol/L (ref 98–110)
Creat: 1.12 mg/dL — ABNORMAL HIGH (ref 0.60–1.00)
Globulin: 3.5 g/dL (ref 1.9–3.7)
Glucose, Bld: 116 mg/dL — ABNORMAL HIGH (ref 65–99)
Potassium: 4.4 mmol/L (ref 3.5–5.3)
Sodium: 138 mmol/L (ref 135–146)
Total Bilirubin: 0.3 mg/dL (ref 0.2–1.2)
Total Protein: 7.6 g/dL (ref 6.1–8.1)
eGFR: 51 mL/min/{1.73_m2} — ABNORMAL LOW (ref >=60)

## 2023-06-08 LAB — HEMOGLOBIN A1C
Hgb A1c MFr Bld: 10.1 %{Hb} — ABNORMAL HIGH (ref <5.7)
Mean Plasma Glucose: 243 mg/dL
eAG (mmol/L): 13.5 mmol/L

## 2023-06-08 LAB — TSH: TSH: 1.32 m[IU]/L (ref 0.40–4.50)

## 2023-06-09 ENCOUNTER — Ambulatory Visit
Admission: RE | Admit: 2023-06-09 | Discharge: 2023-06-09 | Disposition: A | Discharge status: Home / Self Care | Payer: 59 | Source: Ambulatory Visit | Attending: Family | Admitting: Family

## 2023-06-09 DIAGNOSIS — M25561 Pain in right knee: Secondary | ICD-10-CM

## 2023-06-09 DIAGNOSIS — M1711 Unilateral primary osteoarthritis, right knee: Secondary | ICD-10-CM | POA: Diagnosis not present

## 2023-06-14 ENCOUNTER — Encounter: Payer: Self-pay | Admitting: Family

## 2023-06-14 ENCOUNTER — Ambulatory Visit (INDEPENDENT_AMBULATORY_CARE_PROVIDER_SITE_OTHER): Payer: 59 | Admitting: Family

## 2023-06-14 VITALS — BP 134/76 | HR 87 | Temp 97.6°F | Resp 17 | Ht 67.0 in | Wt 207.8 lb

## 2023-06-14 DIAGNOSIS — E785 Hyperlipidemia, unspecified: Secondary | ICD-10-CM | POA: Diagnosis not present

## 2023-06-14 DIAGNOSIS — Z794 Long term (current) use of insulin: Secondary | ICD-10-CM | POA: Diagnosis not present

## 2023-06-14 DIAGNOSIS — E1140 Type 2 diabetes mellitus with diabetic neuropathy, unspecified: Secondary | ICD-10-CM

## 2023-06-14 DIAGNOSIS — I10 Essential (primary) hypertension: Secondary | ICD-10-CM | POA: Diagnosis not present

## 2023-06-14 DIAGNOSIS — M25561 Pain in right knee: Secondary | ICD-10-CM | POA: Diagnosis not present

## 2023-06-14 DIAGNOSIS — N1832 Chronic kidney disease, stage 3b: Secondary | ICD-10-CM | POA: Diagnosis not present

## 2023-06-14 DIAGNOSIS — D509 Iron deficiency anemia, unspecified: Secondary | ICD-10-CM | POA: Diagnosis not present

## 2023-06-14 MED ORDER — GABAPENTIN 300 MG PO CAPS: 300.0000 mg | ORAL_CAPSULE | Freq: Two times a day (BID) | ORAL | 3 refills | Status: AC

## 2023-06-14 MED ORDER — AMLODIPINE BESYLATE 5 MG PO TABS: ORAL_TABLET | ORAL | 1 refills | Status: DC

## 2023-06-14 NOTE — Patient Instructions (Signed)
 Reduce Humalog  from 44 units to 42 units and continue to reduce by 2 units if blood sugar is Less than 75 consistent and notify provider.

## 2023-06-14 NOTE — Progress Notes (Signed)
Provider: Richarda Blade FNP-C  Kiyoshi Schaab, [name], NP  Patient Care Team: Gudrun Axe, [name], NP as PCP - General (Family Medicine) [facility]  Extended Emergency Contact Information Primary Emergency Contact: [facility] Mobile Phone: [phone] Relation: Daughter Secondary Emergency Contact: Wilson,Rachel Mobile Phone: [phone] Relation: Daughter  Code Status: Full Code  Goals of care: Advanced Directive information    06/14/2023   11:19 AM  Advanced Directives  Does Patient Have a Medical Advance Directive? No  Would patient like information on creating a medical advance directive? No - Patient declined     Chief Complaint  Patient presents with   Follow-up    2 week follow-up on diabetes     HPI:  Pt is a 78 y.o. female seen today for an acute visit for 2 weeks follow up for high blood sugars.  She was here on 05/16/2023 reported CBG in the 140's -200's  with A1C 10.7 had been eating treats and chocolate granola bars.  Dietary modification was advised.  She was also advised to increase Lantus to 46 units but declined she was afraid blood sugars might drop too low since she lives by herself.  She was advised to check blood sugars for 2 weeks and then follow-up today for reevaluation.  She forgot to bring her CBG log to visit today.States CBG running in 80's -130's.states sometimes runs in 65 in the morning when she does not eat bedtime snacks. States did not increase Lantus to 46 units as advised.has been using 44 units instead. Takes 18 units of Humalog with meals.Has cut down on sweets and changed her diet.    Past Medical History:  Diagnosis Date   Diabetes mellitus without complication (HCC)    Diabetic retinopathy (HCC)    PDR OU   Hypertension    Hypertensive retinopathy    OU   Stroke [facility]) 2019   Past Surgical History:  Procedure Laterality Date   CATARACT EXTRACTION Bilateral    COLONOSCOPY     [facility] possibly but not  sure. [place] Spaniel it was negative   ESOPHAGOGASTRODUODENOSCOPY     around the same time said it was negative    EYE SURGERY Bilateral    Cat Sx OU   GALLBLADDER SURGERY      Allergies  Allergen Reactions   Penicillins     [place] Spaniel she was young had a reaction    Sulfa Antibiotics Other (See Comments)    Pt unable to report reaction Said she had a rash    Outpatient Encounter Medications as of 06/14/2023  Medication Sig   acetaminophen (TYLENOL) 500 MG tablet Take 500 mg by mouth in the morning and at bedtime.   amLODipine (NORVASC) 5 MG tablet TAKE 1 TABLET(5 MG) BY MOUTH DAILY   Apoaequorin (PREVAGEN PO) Take 50 mcg by mouth daily.   atorvastatin (LIPITOR) 40 MG tablet TAKE 1 TABLET(40 MG) BY MOUTH DAILY   Blood Glucose Monitoring Suppl (ONE TOUCH ULTRA 2) w/Device KIT by Does not apply route.   cimetidine (TAGAMET) 300 MG tablet TAKE 1 TABLET(300 MG) BY MOUTH AT BEDTIME   clopidogrel (PLAVIX) 75 MG tablet TAKE 1 TABLET(75 MG) BY MOUTH DAILY   docusate sodium (COLACE) 100 MG capsule TAKE 1 CAPSULE(100 MG) BY MOUTH DAILY   ferrous sulfate 325 (65 FE) MG tablet Take 1 tablet (325 mg total) by mouth 2 (two) times daily with a meal.   fluticasone (FLONASE) 50 MCG/ACT nasal spray Place 2 sprays into both nostrils in  the morning and at bedtime.   gabapentin (NEURONTIN) 300 MG capsule TAKE 1 CAPSULE BY MOUTH AT NIGHT FOR 1 WEEK. INCREASE TO 1 CAPSULE 2 TIMES DAILY AS TOLERATED   glucose blood (ONETOUCH ULTRA) test strip TEST UPTO FOUR TIMES DAILY   Glycerin-Polysorbate 80 (REFRESH DRY EYE THERAPY OP) Place 1-2 drops into both eyes as needed (for dryness).    insulin glargine (LANTUS SOLOSTAR) 100 UNIT/ML Solostar Pen Inject 44 Units into the skin daily.   insulin lispro (HUMALOG KWIKPEN) 100 UNIT/ML KwikPen ADMINISTER 0 TO 15 UNITS UNDER THE SKIN INTO SKIN THREE TIMES DAILY (Patient taking differently: 18 Units. ADMINISTER 0 TO 18 UNITS UNDER THE SKIN INTO SKIN THREE TIMES DAILY)   Insulin Pen Needle  (BD PEN NEEDLE NANO 2ND GEN) 32G X 4 MM MISC 1 Device by Other route 4 (four) times daily. E11.40   Insulin Syringe-Needle U-100 (INSULIN SYRINGE 1CC/31GX5/16") 31G X 5/16" 1 ML MISC USE AS DIRECTED WITH LANTUS   losartan (COZAAR) 25 MG tablet TAKE 1 TABLET(25 MG) BY MOUTH DAILY   Multiple Vitamins-Minerals (MULTIVITAMIN ADULT PO) Take 1 tablet by mouth daily.   NON FORMULARY Omega XL supplement twice daily.   VITAMIN D PO Take 50 mg by mouth daily.   No facility-administered encounter medications on file as of 06/14/2023.    Review of Systems  Constitutional:  Negative for appetite change, chills, fatigue, fever and unexpected weight change.  Eyes:  Negative for pain, discharge, redness, itching and visual disturbance.  Respiratory:  Negative for cough, chest tightness, shortness of breath and wheezing.   Cardiovascular:  Negative for chest pain, palpitations and leg swelling.  Gastrointestinal:  Negative for abdominal distention, abdominal pain, constipation, diarrhea, nausea and vomiting.  Endocrine: Negative for cold intolerance, heat intolerance, polydipsia, polyphagia and polyuria.  Genitourinary:  Negative for difficulty urinating, dysuria, flank pain, frequency and urgency.  Musculoskeletal:  Negative for arthralgias, back pain, gait problem, joint swelling and myalgias.  Skin:  Negative for color change, pallor, rash and wound.  Neurological:  Negative for dizziness, syncope, speech difficulty, weakness, light-headedness, numbness and headaches.    Immunization History  Administered Date(s) Administered   Influenza, High Dose Seasonal PF 03/07/2018   Influenza,inj,Quad PF,6+ Mos 01/31/2019   PFIZER(Purple Top)SARS-COV-2 Vaccination 10/03/2019, 10/29/2019   Pertinent  Health Maintenance Due  Topic Date Due   DEXA SCAN  Never done   FOOT EXAM  10/27/2022   OPHTHALMOLOGY EXAM  06/24/2023   MAMMOGRAM  08/19/2023   HEMOGLOBIN A1C  12/05/2023   INFLUENZA VACCINE  Discontinued    Colonoscopy  Discontinued      11/07/2022    1:38 PM 01/27/2023    1:00 PM 02/22/2023   10:46 AM 05/16/2023    2:45 PM 06/14/2023   11:18 AM  Fall Risk  Falls in the past year? 0 0 0 0 0  Was there an injury with Fall? 0   0 0  Fall Risk Category Calculator 0   0 0  Patient at Risk for Falls Due to No Fall Risks      Fall risk Follow up Falls evaluation completed       Functional Status Survey:    Vitals:   06/14/23 1123  BP: 134/76  Pulse: 87  Resp: 17  Temp: 97.6 F (36.4 C)  SpO2: 97%  Weight: 207 lb 12.8 oz (94.3 kg)  Height: 5\' 7"  (1.702 m)   Body mass index is 32.55 kg/m. Physical Exam Vitals reviewed.  Constitutional:  General: She is not in acute distress.    Appearance: Normal appearance. She is normal weight. She is not ill-appearing or diaphoretic.  Eyes:     General: No scleral icterus.       Right eye: No discharge.        Left eye: No discharge.     Conjunctiva/sclera: Conjunctivae normal.     Pupils: Pupils are equal, round, and reactive to light.  Cardiovascular:     Rate and Rhythm: Normal rate and regular rhythm.     Pulses: Normal pulses.     Heart sounds: Normal heart sounds. No murmur heard.    No friction rub. No gallop.  Pulmonary:     Effort: Pulmonary effort is normal. No respiratory distress.     Breath sounds: Normal breath sounds. No wheezing, rhonchi or rales.  Chest:     Chest wall: No tenderness.  Abdominal:     General: Bowel sounds are normal. There is no distension.     Palpations: Abdomen is soft. There is no mass.     Tenderness: There is no abdominal tenderness. There is no right CVA tenderness, left CVA tenderness, guarding or rebound.  Musculoskeletal:        General: No swelling.     Cervical back: Normal range of motion. No rigidity or tenderness.     Right lower leg: No edema.     Left lower leg: No edema.  Lymphadenopathy:     Cervical: No cervical adenopathy.  Skin:    General: Skin is warm and dry.      Coloration: Skin is not pale.     Findings: No erythema or rash.  Neurological:     Mental Status: She is alert and oriented to person, place, and time.     Motor: No weakness.     Gait: Gait abnormal.  Psychiatric:        Mood and Affect: Mood normal.        Speech: Speech normal.        Behavior: Behavior normal.     Labs reviewed: Recent Labs    10/26/22 1102 05/10/23 0804 06/07/23 1042  NA 140 137 138  K 4.3 4.5 4.4  CL 108 103 104  CO2 26 26 24   GLUCOSE 116* 147* 116*  BUN 22 24 25   CREATININE 1.30* 1.34* 1.12*  CALCIUM 9.0 9.0 9.8   Recent Labs    10/26/22 1102 05/10/23 0804 06/07/23 1042  AST 17 13 17   ALT 13 9 9   BILITOT 0.3 0.2 0.3  PROT 7.1 7.2 7.6   Recent Labs    10/26/22 1102 05/10/23 0804 06/07/23 1042  WBC 5.2 6.6 5.3  NEUTROABS 2,475 3,359 2,507  HGB 10.1* 9.0* 10.0*  HCT 31.5* 28.2* 31.3*  MCV 85.6 85.5 83.9  PLT 456* 491* 504*   Lab Results  Component Value Date   TSH 1.32 06/07/2023   Lab Results  Component Value Date   HGBA1C 10.1 (H) 06/07/2023   Lab Results  Component Value Date   CHOL 135 06/07/2023   HDL 45 (L) 06/07/2023   LDLCALC 72 06/07/2023   LDLDIRECT 54.0 08/10/2021   TRIG 101 06/07/2023   CHOLHDL 3.0 06/07/2023    Significant Diagnostic Results in last 30 days:  DG Knee Complete 4 Views Right Result Date: 06/10/2023 CLINICAL DATA:  Chronic right knee pain. EXAM: RIGHT KNEE - COMPLETE 4+ VIEW COMPARISON:  None Available. FINDINGS: No fracture or dislocation. Mild degenerative change of the patellofemoral  joint with articular surface irregularity and osteophytosis. Enthesopathic change involving the superior pole of the patella. No evidence of chondrocalcinosis. No definite knee joint effusion. Scattered adjacent vascular calcifications. Regional soft tissues appear normal. IMPRESSION: 1. Degenerative change of patellofemoral joint and enthesopathic change involving the superior pole of the patella. 2. Otherwise, no  explanation for patient's chronic right knee pain. 3. Adjacent atherosclerotic disease. Electronically Signed   By: Simonne Come M.D.   On: 06/10/2023 13:36    Assessment/Plan 1. Type 2 diabetes mellitus with diabetic neuropathy, with long-term current use of insulin (HCC) (Primary) Lab Results  Component Value Date   HGBA1C 10.1 (H) 06/07/2023  CBGs have improved since she adjusted her diet and stopped eating sweets and sugary snacks.  Reports some  hypoglycemic readings. Advised to reduce Lantus from 44 units to 42 units and continue to titrate by 2 units  if still having blood sugar readings in the 60s and notify provider. -Advised to eat bedtime snack to prevent low blood sugars in the morning -Continue on Humalog 18 units 3 times daily with meals -Continue Lantus as above -Continue to follow-up with ophthalmology for eye exam -Continue to follow-up with podiatrist for foot exam -On losartan for renal protection -Continue on atorvastatin and Plavix - TSH; Future - COMPLETE METABOLIC PANEL WITH GFR; Future - CBC with Differential/Platelet; Future - Hemoglobin A1c; Future - gabapentin (NEURONTIN) 300 MG capsule; Take 1 capsule (300 mg total) by mouth 2 (two) times daily.  Dispense: 60 capsule; Refill: 3  2. Essential hypertension -Blood pressure at goal -Continue with dietary modification and exercise as tolerated -Continue on amlodipine and losartan - TSH; Future - COMPLETE METABOLIC PANEL WITH GFR; Future - CBC with Differential/Platelet; Future - amLODipine (NORVASC) 5 MG tablet; TAKE 1 TABLET(5 MG) BY MOUTH DAILY  Dispense: 90 tablet; Refill: 1  3. Iron deficiency anemia, unspecified iron deficiency anemia type Hgb stable -Continue on ferrous sulfate no side effects reported.  -Continue to Docusate - CBC with Differential/Platelet; Future  4. Stage 3b chronic kidney disease (HCC) CR 1.12 improved compared to previous.  Continue to avoid nephrotoxic's and dose other  medication for renal clearance. - COMPLETE METABOLIC PANEL WITH GFR; Future  5. Hyperlipidemia LDL goal <70 LDL at goal -Continue with atorvastatin -Continue with dietary modification and exercise as tolerated - Lipid panel; Future  6. Right knee pain, unspecified chronicity Chronic -Continue on gabapentin - gabapentin (NEURONTIN) 300 MG capsule; Take 1 capsule (300 mg total) by mouth 2 (two) times daily.  Dispense: 60 capsule; Refill: 3   Family/ staff Communication: Reviewed plan of care with patient verbalized understanding  Labs/tests ordered:  - TSH; Future - COMPLETE METABOLIC PANEL WITH GFR; Future - CBC with Differential/Platelet; Future - Lipid panel; Future - Hemoglobin A1c; Future  Next Appointment: Return if symptoms worsen or fail to improve.   Caesar Bookman, NP

## 2023-06-18 ENCOUNTER — Other Ambulatory Visit: Payer: Self-pay

## 2023-06-18 ENCOUNTER — Inpatient Hospital Stay (HOSPITAL_COMMUNITY)
Admission: EM | Admit: 2023-06-18 | Discharge: 2023-06-23 | DRG: 378 | Disposition: A | Discharge status: Home Health | Payer: 59 | Attending: Internal Medicine | Admitting: Internal Medicine

## 2023-06-18 ENCOUNTER — Telehealth: Payer: Self-pay | Admitting: Adult Health

## 2023-06-18 DIAGNOSIS — K3182 Dieulafoy lesion (hemorrhagic) of stomach and duodenum: Principal | ICD-10-CM | POA: Diagnosis present

## 2023-06-18 DIAGNOSIS — D649 Anemia, unspecified: Secondary | ICD-10-CM | POA: Diagnosis present

## 2023-06-18 DIAGNOSIS — R42 Dizziness and giddiness: Secondary | ICD-10-CM | POA: Diagnosis not present

## 2023-06-18 DIAGNOSIS — K921 Melena: Secondary | ICD-10-CM | POA: Diagnosis not present

## 2023-06-18 DIAGNOSIS — R7989 Other specified abnormal findings of blood chemistry: Secondary | ICD-10-CM | POA: Diagnosis present

## 2023-06-18 DIAGNOSIS — E785 Hyperlipidemia, unspecified: Secondary | ICD-10-CM | POA: Diagnosis not present

## 2023-06-18 DIAGNOSIS — E119 Type 2 diabetes mellitus without complications: Secondary | ICD-10-CM | POA: Diagnosis not present

## 2023-06-18 DIAGNOSIS — Z7902 Long term (current) use of antithrombotics/antiplatelets: Secondary | ICD-10-CM

## 2023-06-18 DIAGNOSIS — I499 Cardiac arrhythmia, unspecified: Secondary | ICD-10-CM | POA: Diagnosis not present

## 2023-06-18 DIAGNOSIS — K449 Diaphragmatic hernia without obstruction or gangrene: Secondary | ICD-10-CM | POA: Diagnosis present

## 2023-06-18 DIAGNOSIS — Z8673 Personal history of transient ischemic attack (TIA), and cerebral infarction without residual deficits: Secondary | ICD-10-CM | POA: Diagnosis not present

## 2023-06-18 DIAGNOSIS — Z833 Family history of diabetes mellitus: Secondary | ICD-10-CM

## 2023-06-18 DIAGNOSIS — I129 Hypertensive chronic kidney disease with stage 1 through stage 4 chronic kidney disease, or unspecified chronic kidney disease: Secondary | ICD-10-CM | POA: Diagnosis present

## 2023-06-18 DIAGNOSIS — D62 Acute posthemorrhagic anemia: Secondary | ICD-10-CM | POA: Diagnosis present

## 2023-06-18 DIAGNOSIS — Z9842 Cataract extraction status, left eye: Secondary | ICD-10-CM

## 2023-06-18 DIAGNOSIS — Z7982 Long term (current) use of aspirin: Secondary | ICD-10-CM

## 2023-06-18 DIAGNOSIS — E1140 Type 2 diabetes mellitus with diabetic neuropathy, unspecified: Secondary | ICD-10-CM

## 2023-06-18 DIAGNOSIS — Z882 Allergy status to sulfonamides status: Secondary | ICD-10-CM | POA: Diagnosis not present

## 2023-06-18 DIAGNOSIS — K222 Esophageal obstruction: Secondary | ICD-10-CM | POA: Diagnosis not present

## 2023-06-18 DIAGNOSIS — Z880 Allergy status to penicillin: Secondary | ICD-10-CM | POA: Diagnosis not present

## 2023-06-18 DIAGNOSIS — Z79899 Other long term (current) drug therapy: Secondary | ICD-10-CM

## 2023-06-18 DIAGNOSIS — D631 Anemia in chronic kidney disease: Secondary | ICD-10-CM | POA: Diagnosis not present

## 2023-06-18 DIAGNOSIS — I2489 Other forms of acute ischemic heart disease: Secondary | ICD-10-CM | POA: Diagnosis not present

## 2023-06-18 DIAGNOSIS — N1832 Chronic kidney disease, stage 3b: Secondary | ICD-10-CM | POA: Diagnosis present

## 2023-06-18 DIAGNOSIS — R6889 Other general symptoms and signs: Secondary | ICD-10-CM | POA: Diagnosis present

## 2023-06-18 DIAGNOSIS — Z794 Long term (current) use of insulin: Secondary | ICD-10-CM

## 2023-06-18 DIAGNOSIS — R739 Hyperglycemia, unspecified: Secondary | ICD-10-CM | POA: Diagnosis not present

## 2023-06-18 DIAGNOSIS — E1122 Type 2 diabetes mellitus with diabetic chronic kidney disease: Secondary | ICD-10-CM | POA: Diagnosis not present

## 2023-06-18 DIAGNOSIS — R778 Other specified abnormalities of plasma proteins: Secondary | ICD-10-CM | POA: Diagnosis not present

## 2023-06-18 DIAGNOSIS — R404 Transient alteration of awareness: Secondary | ICD-10-CM | POA: Diagnosis not present

## 2023-06-18 DIAGNOSIS — Z9841 Cataract extraction status, right eye: Secondary | ICD-10-CM

## 2023-06-18 DIAGNOSIS — R531 Weakness: Secondary | ICD-10-CM | POA: Diagnosis not present

## 2023-06-18 DIAGNOSIS — Z743 Need for continuous supervision: Secondary | ICD-10-CM | POA: Diagnosis not present

## 2023-06-18 DIAGNOSIS — K922 Gastrointestinal hemorrhage, unspecified: Principal | ICD-10-CM

## 2023-06-18 LAB — CBG MONITORING, ED: Glucose-Capillary: 180 mg/dL — ABNORMAL HIGH (ref 70–99)

## 2023-06-18 MED ORDER — LACTATED RINGERS IV BOLUS
1000.0000 mL | Freq: Once | INTRAVENOUS | Status: AC
  Administered 2023-06-19: 1000 mL via INTRAVENOUS

## 2023-06-18 NOTE — ED Provider Notes (Signed)
[place] EMERGENCY DEPARTMENT AT [facility] Provider Note   CSN: [id number] Arrival date & time: 06/18/23  2129     History Chief Complaint  Patient presents with   Dizziness    Tara Conner is a 78 y.o. female.   Dizziness      Home Medications Prior to Admission medications   Medication Sig Start Date End Date Taking? Authorizing Provider  acetaminophen (TYLENOL) 500 MG tablet Take 500 mg by mouth in the morning and at bedtime.    [provider]  amLODipine (NORVASC) 5 MG tablet TAKE 1 TABLET(5 MG) BY MOUTH DAILY 06/14/23   [name], [name], NP  Apoaequorin (PREVAGEN PO) Take 50 mcg by mouth daily.    [provider]  atorvastatin (LIPITOR) 40 MG tablet TAKE 1 TABLET(40 MG) BY MOUTH DAILY 08/15/22   [name], [name], NP  Blood Glucose Monitoring Suppl (ONE TOUCH ULTRA 2) w/Device KIT by Does not apply route.    [provider]  cimetidine (TAGAMET) 300 MG tablet TAKE 1 TABLET(300 MG) BY MOUTH AT BEDTIME 11/21/22   Mliss Sax, MD  clopidogrel (PLAVIX) 75 MG tablet TAKE 1 TABLET(75 MG) BY MOUTH DAILY 05/15/23   [name], [name], NP  docusate sodium (COLACE) 100 MG capsule TAKE 1 CAPSULE(100 MG) BY MOUTH DAILY 05/13/22   [name], [name], NP  ferrous sulfate 325 (65 FE) MG tablet Take 1 tablet (325 mg total) by mouth 2 (two) times daily with a meal. 04/28/22   [name], [name], NP  fluticasone (FLONASE) 50 MCG/ACT nasal spray Place 2 sprays into both nostrils in the morning and at bedtime. 04/12/23   Read Drivers, MD  gabapentin (NEURONTIN) 300 MG capsule Take 1 capsule (300 mg total) by mouth 2 (two) times daily. 06/14/23   [name], [name], NP  glucose blood (ONETOUCH ULTRA) test strip TEST UPTO FOUR TIMES DAILY 05/01/23   [name], [name], NP  Glycerin-Polysorbate 80 (REFRESH DRY EYE THERAPY OP) Place 1-2 drops into both eyes as needed (for dryness).     [provider]  insulin glargine (LANTUS SOLOSTAR) 100  UNIT/ML Solostar Pen Inject 44 Units into the skin daily. 04/21/23   [name], [name], NP  insulin lispro (HUMALOG KWIKPEN) 100 UNIT/ML KwikPen ADMINISTER 0 TO 15 UNITS UNDER THE SKIN INTO SKIN THREE TIMES DAILY Patient taking differently: 18 Units. ADMINISTER 0 TO 18 UNITS UNDER THE SKIN INTO SKIN THREE TIMES DAILY 04/21/23   [name], [name], NP  Insulin Pen Needle (BD PEN NEEDLE NANO 2ND GEN) 32G X 4 MM MISC 1 Device by Other route 4 (four) times daily. E11.40 04/28/22   [name], [name], NP  Insulin Syringe-Needle U-100 (INSULIN SYRINGE 1CC/31GX5/16") 31G X 5/16" 1 ML MISC USE AS DIRECTED WITH LANTUS 02/27/20   Mliss Sax, MD  losartan (COZAAR) 25 MG tablet TAKE 1 TABLET(25 MG) BY MOUTH DAILY 05/15/23   [name], [name], NP  Multiple Vitamins-Minerals (MULTIVITAMIN ADULT PO) Take 1 tablet by mouth daily.    [provider]  NON FORMULARY Omega XL supplement twice daily.    [provider]  VITAMIN D PO Take 50 mg by mouth daily.    [provider]      Allergies    Penicillins and Sulfa antibiotics    Review of Systems   Review of Systems  Neurological:  Positive for dizziness.    Physical Exam Updated Vital Signs BP 128/74 (BP Location: Right Arm)   Pulse Marland Kitchen)  101   Temp 98.1 F (36.7 C) (Oral)   Resp 16   Ht 5\' 7"  (1.702 m)   Wt 93.9 kg   LMP  (LMP Unknown)   SpO2 100%   BMI 32.42 kg/m  Physical Exam  ED Results / Procedures / Treatments   Labs (all labs ordered are listed, but only abnormal results are displayed) Labs Reviewed  CBG MONITORING, ED - Abnormal; Notable for the following components:      Result Value   Glucose-Capillary 180 (*)    All other components within normal limits    EKG None  Radiology No results found.  Procedures Procedures   Medications Ordered in ED Medications - No data to display  ED Course/ Medical Decision Making/ A&P                              Medical Decision Making  78 y.o.  female presents to the ER for evaluation of ***. Differential diagnosis includes but is not limited to ***. Vital signs ***. Physical exam as noted above.   I independently reviewed and interpreted the patient's labs. ***.  After consideration of the diagnostic results and the patients response to treatment, I feel that *** .   Emergency department workup does*** not suggest an emergent condition requiring admission or immediate intervention beyond what has been performed at this time.   ***We discussed the results of the labs/imaging. The plan is ***. We discussed strict return precautions and red flag symptoms. The patient verbalized their understanding and agrees to the plan. The patient is stable and being discharged home in good condition.  ***Portions of this report may have been transcribed using voice recognition software. Every effort was made to ensure accuracy; however, inadvertent computerized transcription errors may be present.   Final Clinical Impression(s) / ED Diagnoses Final diagnoses:  None    Rx / DC Orders ED Discharge Orders     None

## 2023-06-18 NOTE — ED Triage Notes (Addendum)
Pt reports that she has had an ear infection x3 weeks. Woke up this morning w/ dizziness, and muffled right ear. No pain reported at this time. Pt A&Ox4. Hx of DM, HTN, and recent stroke per EMS.

## 2023-06-18 NOTE — Telephone Encounter (Signed)
Patient call on-call provider stating tat her BP 166/50, then 124/50 then 94/54. She stated that her daughter told her to call 911 so she is about to call and hanged up.

## 2023-06-19 ENCOUNTER — Emergency Department (HOSPITAL_COMMUNITY): Payer: 59

## 2023-06-19 ENCOUNTER — Encounter (HOSPITAL_COMMUNITY): Payer: Self-pay | Admitting: Family Medicine

## 2023-06-19 DIAGNOSIS — K921 Melena: Secondary | ICD-10-CM | POA: Diagnosis not present

## 2023-06-19 DIAGNOSIS — K449 Diaphragmatic hernia without obstruction or gangrene: Secondary | ICD-10-CM | POA: Diagnosis present

## 2023-06-19 DIAGNOSIS — R531 Weakness: Secondary | ICD-10-CM

## 2023-06-19 DIAGNOSIS — E785 Hyperlipidemia, unspecified: Secondary | ICD-10-CM | POA: Diagnosis present

## 2023-06-19 DIAGNOSIS — Z8673 Personal history of transient ischemic attack (TIA), and cerebral infarction without residual deficits: Secondary | ICD-10-CM | POA: Diagnosis not present

## 2023-06-19 DIAGNOSIS — K3182 Dieulafoy lesion (hemorrhagic) of stomach and duodenum: Secondary | ICD-10-CM | POA: Diagnosis present

## 2023-06-19 DIAGNOSIS — I2489 Other forms of acute ischemic heart disease: Secondary | ICD-10-CM | POA: Diagnosis present

## 2023-06-19 DIAGNOSIS — R6889 Other general symptoms and signs: Secondary | ICD-10-CM | POA: Diagnosis present

## 2023-06-19 DIAGNOSIS — R7989 Other specified abnormal findings of blood chemistry: Secondary | ICD-10-CM | POA: Diagnosis present

## 2023-06-19 DIAGNOSIS — E1122 Type 2 diabetes mellitus with diabetic chronic kidney disease: Secondary | ICD-10-CM | POA: Diagnosis present

## 2023-06-19 DIAGNOSIS — E1140 Type 2 diabetes mellitus with diabetic neuropathy, unspecified: Secondary | ICD-10-CM | POA: Diagnosis present

## 2023-06-19 DIAGNOSIS — Z7902 Long term (current) use of antithrombotics/antiplatelets: Secondary | ICD-10-CM | POA: Diagnosis not present

## 2023-06-19 DIAGNOSIS — Z833 Family history of diabetes mellitus: Secondary | ICD-10-CM | POA: Diagnosis not present

## 2023-06-19 DIAGNOSIS — N1832 Chronic kidney disease, stage 3b: Secondary | ICD-10-CM

## 2023-06-19 DIAGNOSIS — Z9842 Cataract extraction status, left eye: Secondary | ICD-10-CM | POA: Diagnosis not present

## 2023-06-19 DIAGNOSIS — Z880 Allergy status to penicillin: Secondary | ICD-10-CM | POA: Diagnosis not present

## 2023-06-19 DIAGNOSIS — D649 Anemia, unspecified: Secondary | ICD-10-CM | POA: Diagnosis not present

## 2023-06-19 DIAGNOSIS — Z9841 Cataract extraction status, right eye: Secondary | ICD-10-CM | POA: Diagnosis not present

## 2023-06-19 DIAGNOSIS — Z794 Long term (current) use of insulin: Secondary | ICD-10-CM | POA: Diagnosis not present

## 2023-06-19 DIAGNOSIS — D631 Anemia in chronic kidney disease: Secondary | ICD-10-CM | POA: Diagnosis present

## 2023-06-19 DIAGNOSIS — K3189 Other diseases of stomach and duodenum: Secondary | ICD-10-CM | POA: Diagnosis not present

## 2023-06-19 DIAGNOSIS — K222 Esophageal obstruction: Secondary | ICD-10-CM | POA: Diagnosis present

## 2023-06-19 DIAGNOSIS — Z7982 Long term (current) use of aspirin: Secondary | ICD-10-CM | POA: Diagnosis not present

## 2023-06-19 DIAGNOSIS — D62 Acute posthemorrhagic anemia: Secondary | ICD-10-CM | POA: Diagnosis present

## 2023-06-19 DIAGNOSIS — E119 Type 2 diabetes mellitus without complications: Secondary | ICD-10-CM | POA: Diagnosis not present

## 2023-06-19 DIAGNOSIS — K922 Gastrointestinal hemorrhage, unspecified: Secondary | ICD-10-CM | POA: Diagnosis present

## 2023-06-19 DIAGNOSIS — I129 Hypertensive chronic kidney disease with stage 1 through stage 4 chronic kidney disease, or unspecified chronic kidney disease: Secondary | ICD-10-CM | POA: Diagnosis present

## 2023-06-19 DIAGNOSIS — Z882 Allergy status to sulfonamides status: Secondary | ICD-10-CM | POA: Diagnosis not present

## 2023-06-19 DIAGNOSIS — Z79899 Other long term (current) drug therapy: Secondary | ICD-10-CM | POA: Diagnosis not present

## 2023-06-19 LAB — CBC WITH DIFFERENTIAL/PLATELET
Abs Immature Granulocytes: 0.13 10*3/uL — ABNORMAL HIGH (ref 0.00–0.07)
Basophils Absolute: 0.1 10*3/uL (ref 0.0–0.1)
Basophils Relative: 1 %
Eosinophils Absolute: 0.2 10*3/uL (ref 0.0–0.5)
Eosinophils Relative: 2 %
HCT: 16.3 % — ABNORMAL LOW (ref 36.0–46.0)
Hemoglobin: 5.2 g/dL — CL (ref 12.0–15.0)
Immature Granulocytes: 1 %
Lymphocytes Relative: 26 %
Lymphs Abs: 3.1 10*3/uL (ref 0.7–4.0)
MCH: 28.1 pg (ref 26.0–34.0)
MCHC: 31.9 g/dL (ref 30.0–36.0)
MCV: 88.1 fL (ref 80.0–100.0)
Monocytes Absolute: 1 10*3/uL (ref 0.1–1.0)
Monocytes Relative: 8 %
Neutro Abs: 7.4 10*3/uL (ref 1.7–7.7)
Neutrophils Relative %: 62 %
Platelets: 323 10*3/uL (ref 150–400)
RBC: 1.85 MIL/uL — ABNORMAL LOW (ref 3.87–5.11)
RDW: 14 % (ref 11.5–15.5)
WBC: 11.8 10*3/uL — ABNORMAL HIGH (ref 4.0–10.5)
nRBC: 0.2 % (ref 0.0–0.2)

## 2023-06-19 LAB — PREPARE RBC (CROSSMATCH)

## 2023-06-19 LAB — CBG MONITORING, ED
Glucose-Capillary: 208 mg/dL — ABNORMAL HIGH (ref 70–99)
Glucose-Capillary: 248 mg/dL — ABNORMAL HIGH (ref 70–99)
Glucose-Capillary: 207 mg/dL — ABNORMAL HIGH (ref 70–99)
Glucose-Capillary: 174 mg/dL — ABNORMAL HIGH (ref 70–99)
Glucose-Capillary: 295 mg/dL — ABNORMAL HIGH (ref 70–99)

## 2023-06-19 LAB — COMPREHENSIVE METABOLIC PANEL
ALT: 16 U/L (ref 0–44)
AST: 20 U/L (ref 15–41)
Albumin: 3 g/dL — ABNORMAL LOW (ref 3.5–5.0)
Alkaline Phosphatase: 54 U/L (ref 38–126)
Anion gap: 13 (ref 5–15)
BUN: 45 mg/dL — ABNORMAL HIGH (ref 8–23)
CO2: 23 mmol/L (ref 22–32)
Calcium: 8.7 mg/dL — ABNORMAL LOW (ref 8.9–10.3)
Chloride: 101 mmol/L (ref 98–111)
Creatinine, Ser: 1.27 mg/dL — ABNORMAL HIGH (ref 0.44–1.00)
GFR, Estimated: 44 mL/min — ABNORMAL LOW (ref >60)
Glucose, Bld: 208 mg/dL — ABNORMAL HIGH (ref 70–99)
Potassium: 3.9 mmol/L (ref 3.5–5.1)
Sodium: 137 mmol/L (ref 135–145)
Total Bilirubin: 0.4 mg/dL (ref 0.0–1.2)
Total Protein: 6.2 g/dL — ABNORMAL LOW (ref 6.5–8.1)

## 2023-06-19 LAB — CBC
HCT: 28.7 % — ABNORMAL LOW (ref 36.0–46.0)
HCT: 28 % — ABNORMAL LOW (ref 36.0–46.0)
Hemoglobin: 9.6 g/dL — ABNORMAL LOW (ref 12.0–15.0)
Hemoglobin: 9.5 g/dL — ABNORMAL LOW (ref 12.0–15.0)
MCH: 28.6 pg (ref 26.0–34.0)
MCH: 28.4 pg (ref 26.0–34.0)
MCHC: 33.4 g/dL (ref 30.0–36.0)
MCHC: 33.9 g/dL (ref 30.0–36.0)
MCV: 85.4 fL (ref 80.0–100.0)
MCV: 83.8 fL (ref 80.0–100.0)
Platelets: 278 10*3/uL (ref 150–400)
Platelets: 279 10*3/uL (ref 150–400)
RBC: 3.36 MIL/uL — ABNORMAL LOW (ref 3.87–5.11)
RBC: 3.34 MIL/uL — ABNORMAL LOW (ref 3.87–5.11)
RDW: 14.5 % (ref 11.5–15.5)
RDW: 14.7 % (ref 11.5–15.5)
WBC: 11.6 10*3/uL — ABNORMAL HIGH (ref 4.0–10.5)
WBC: 11.9 10*3/uL — ABNORMAL HIGH (ref 4.0–10.5)
nRBC: 0.3 % — ABNORMAL HIGH (ref 0.0–0.2)
nRBC: 0.5 % — ABNORMAL HIGH (ref 0.0–0.2)

## 2023-06-19 LAB — HEMOGLOBIN AND HEMATOCRIT, BLOOD
HCT: 15.3 % — ABNORMAL LOW (ref 36.0–46.0)
Hemoglobin: 4.8 g/dL — CL (ref 12.0–15.0)

## 2023-06-19 LAB — IRON AND TIBC
Iron: 109 ug/dL (ref 28–170)
Saturation Ratios: 32 % — ABNORMAL HIGH (ref 10.4–31.8)
TIBC: 342 ug/dL (ref 250–450)
UIBC: 233 ug/dL

## 2023-06-19 LAB — MAGNESIUM: Magnesium: 1.8 mg/dL (ref 1.7–2.4)

## 2023-06-19 LAB — BASIC METABOLIC PANEL
Anion gap: 10 (ref 5–15)
BUN: 35 mg/dL — ABNORMAL HIGH (ref 8–23)
CO2: 22 mmol/L (ref 22–32)
Calcium: 8.7 mg/dL — ABNORMAL LOW (ref 8.9–10.3)
Chloride: 104 mmol/L (ref 98–111)
Creatinine, Ser: 1.08 mg/dL — ABNORMAL HIGH (ref 0.44–1.00)
GFR, Estimated: 53 mL/min — ABNORMAL LOW (ref >60)
Glucose, Bld: 217 mg/dL — ABNORMAL HIGH (ref 70–99)
Potassium: 4 mmol/L (ref 3.5–5.1)
Sodium: 136 mmol/L (ref 135–145)

## 2023-06-19 LAB — RETICULOCYTES
Immature Retic Fract: 39.9 % — ABNORMAL HIGH (ref 2.3–15.9)
RBC.: 1.75 MIL/uL — ABNORMAL LOW (ref 3.87–5.11)
Retic Count, Absolute: 77.7 10*3/uL (ref 19.0–186.0)
Retic Ct Pct: 4.4 % — ABNORMAL HIGH (ref 0.4–3.1)

## 2023-06-19 LAB — URINALYSIS, ROUTINE W REFLEX MICROSCOPIC
Bilirubin Urine: NEGATIVE
Glucose, UA: NEGATIVE mg/dL
Hgb urine dipstick: NEGATIVE
Ketones, ur: NEGATIVE mg/dL
Leukocytes,Ua: NEGATIVE
Nitrite: NEGATIVE
Protein, ur: NEGATIVE mg/dL
Specific Gravity, Urine: 1.008 (ref 1.005–1.030)
pH: 5 (ref 5.0–8.0)

## 2023-06-19 LAB — FERRITIN: Ferritin: 16 ng/mL (ref 11–307)

## 2023-06-19 LAB — FOLATE: Folate: 20.1 ng/mL (ref >5.9)

## 2023-06-19 LAB — POC OCCULT BLOOD, ED: Fecal Occult Bld: POSITIVE — AB

## 2023-06-19 LAB — GLUCOSE, CAPILLARY
Glucose-Capillary: 192 mg/dL — ABNORMAL HIGH (ref 70–99)
Glucose-Capillary: 285 mg/dL — ABNORMAL HIGH (ref 70–99)

## 2023-06-19 LAB — TROPONIN I (HIGH SENSITIVITY)
Troponin I (High Sensitivity): 42 ng/L — ABNORMAL HIGH (ref <18)
Troponin I (High Sensitivity): 43 ng/L — ABNORMAL HIGH (ref <18)

## 2023-06-19 LAB — VITAMIN B12: Vitamin B-12: 780 pg/mL (ref 180–914)

## 2023-06-19 LAB — ABO/RH: ABO/RH(D): B POS

## 2023-06-19 MED ORDER — ACETAMINOPHEN 325 MG PO TABS: 650.0000 mg | ORAL_TABLET | Freq: Four times a day (QID) | ORAL | Status: DC | PRN

## 2023-06-19 MED ORDER — SODIUM CHLORIDE 0.9% IV SOLUTION: Freq: Once | INTRAVENOUS | Status: AC

## 2023-06-19 MED ORDER — INSULIN GLARGINE-YFGN 100 UNIT/ML ~~LOC~~ SOLN
10.0000 [IU] | Freq: Every day | SUBCUTANEOUS | Status: DC
  Administered 2023-06-19 – 2023-06-21 (×3): 10 [IU] via SUBCUTANEOUS
  Filled 2023-06-19 (×3): qty 0.1

## 2023-06-19 MED ORDER — ACETAMINOPHEN 650 MG RE SUPP: 650.0000 mg | Freq: Four times a day (QID) | RECTAL | Status: DC | PRN

## 2023-06-19 MED ORDER — PROCHLORPERAZINE EDISYLATE 10 MG/2ML IJ SOLN: 5.0000 mg | INTRAMUSCULAR | Status: DC | PRN

## 2023-06-19 MED ORDER — PANTOPRAZOLE SODIUM 40 MG IV SOLR
80.0000 mg | Freq: Once | INTRAVENOUS | Status: AC
  Administered 2023-06-19: 80 mg via INTRAVENOUS
  Filled 2023-06-19: qty 20

## 2023-06-19 MED ORDER — INSULIN ASPART 100 UNIT/ML IJ SOLN
0.0000 [IU] | INTRAMUSCULAR | Status: DC
  Administered 2023-06-19: 3 [IU] via SUBCUTANEOUS
  Administered 2023-06-19: 1 [IU] via SUBCUTANEOUS
  Administered 2023-06-19 (×2): 2 [IU] via SUBCUTANEOUS
  Administered 2023-06-19: 1 [IU] via SUBCUTANEOUS
  Administered 2023-06-19: 3 [IU] via SUBCUTANEOUS
  Administered 2023-06-20 (×2): 1 [IU] via SUBCUTANEOUS
  Administered 2023-06-20: 3 [IU] via SUBCUTANEOUS
  Administered 2023-06-20: 2 [IU] via SUBCUTANEOUS

## 2023-06-19 MED ORDER — LABETALOL HCL 5 MG/ML IV SOLN
5.0000 mg | INTRAVENOUS | Status: DC | PRN
  Filled 2023-06-19: qty 4

## 2023-06-19 MED ORDER — PANTOPRAZOLE SODIUM 40 MG IV SOLR
40.0000 mg | Freq: Two times a day (BID) | INTRAVENOUS | Status: DC
  Administered 2023-06-19 – 2023-06-23 (×8): 40 mg via INTRAVENOUS
  Filled 2023-06-19 (×8): qty 10

## 2023-06-19 MED ORDER — SODIUM CHLORIDE 0.9 % IV SOLN: INTRAVENOUS | Status: DC

## 2023-06-19 MED ORDER — SODIUM CHLORIDE 0.9% FLUSH
3.0000 mL | Freq: Two times a day (BID) | INTRAVENOUS | Status: DC
  Administered 2023-06-19 – 2023-06-23 (×8): 3 mL via INTRAVENOUS

## 2023-06-19 MED ORDER — HYDRALAZINE HCL 20 MG/ML IJ SOLN: 10.0000 mg | Freq: Four times a day (QID) | INTRAMUSCULAR | Status: DC | PRN

## 2023-06-19 MED ORDER — HYDRALAZINE HCL 20 MG/ML IJ SOLN
5.0000 mg | Freq: Four times a day (QID) | INTRAMUSCULAR | Status: DC | PRN
Start: 2023-06-19 — End: 2023-06-19

## 2023-06-19 MED ORDER — IOHEXOL 350 MG/ML SOLN
75.0000 mL | Freq: Once | INTRAVENOUS | Status: AC | PRN
  Administered 2023-06-19: 75 mL via INTRAVENOUS

## 2023-06-19 NOTE — ED Provider Notes (Signed)
  Physical Exam  BP (!) 121/51   Pulse (!) 104   Temp 98.6 F (37 C) (Oral)   Resp 16   Ht 5\' 7"  (1.702 m)   Wt 93.9 kg   LMP  (LMP Unknown)   SpO2 100%   BMI 32.42 kg/m   Physical Exam Vitals and nursing note reviewed.  Constitutional:      Appearance: She is well-developed.  HENT:     Head: Normocephalic and atraumatic.  Cardiovascular:     Rate and Rhythm: Regular rhythm. Tachycardia present.  Pulmonary:     Effort: No respiratory distress.     Breath sounds: No stridor.  Abdominal:     General: There is no distension.  Musculoskeletal:     Cervical back: Normal range of motion.  Skin:    General: Skin is warm and dry.  Neurological:     General: No focal deficit present.     Mental Status: She is alert.     Procedures  .Critical Care  Performed by: Marily Memos, MD Authorized by: Marily Memos, MD   Critical care provider statement:    Critical care time (minutes):  35   Critical care was necessary to treat or prevent imminent or life-threatening deterioration of the following conditions:  Circulatory failure   Critical care was time spent personally by me on the following activities:  Development of treatment plan with patient or surrogate, discussions with consultants, evaluation of patient's response to treatment, examination of patient, ordering and review of laboratory studies, ordering and review of radiographic studies, ordering and performing treatments and interventions, pulse oximetry, re-evaluation of patient's condition and review of old charts   ED Course / MDM    Medical Decision Making Amount and/or Complexity of Data Reviewed Labs: ordered. Radiology: ordered.  Risk Prescription drug management. Decision regarding hospitalization.   Here with anemia of unclear etiology. Blood transfusions ordered. Hemoccult pending.   Melena. Hemoccult positive. Likely source. Will need admit, GI source, plan for admit GI consult.    Sharece Fleischhacker, Barbara Cower,  MD 06/19/23 [phone]

## 2023-06-19 NOTE — ED Notes (Signed)
Daughter Aram Beecham [phone] would like an update asap

## 2023-06-19 NOTE — H&P (Signed)
History and Physical    Brightyn Zerr ZOX:[id number] DOB: 06/22/1945 DOA: 06/18/2023  PCP: Caesar Bookman, NP   Patient coming from: Home   Chief Complaint: Lightheaded, fatigue   HPI: Timara Dohner is a 78 y.o. female with medical history significant for hypertension, insulin-dependent diabetes mellitus, history of CVA, and CKD 3B who presents with multiple complaints including lightheadedness and fatigue.  Patient woke yesterday morning with lightheadedness that is worse when standing or performing any activity.  She also feels fatigued in general.  She denies any chest pain, abdominal pain, nausea, vomiting, or diarrhea.  She reports having multiple stools yesterday that were darker than normal.  She continues to take Plavix but has not used any NSAID in approximately a year.  ED Course: Upon arrival to the ED, patient is found to be afebrile and saturating well on room air with mild tachycardia and stable blood pressure.  Labs are most notable for BUN 45, creatinine 1.27, hemoglobin 5.2, troponin 42, and positive FOBT.  Patient was given a liter of NS and 3 units RBC were ordered for transfusion from the ED.  ED PA sent a secure chat to [facility] with request for consultation.  Review of Systems:  All other systems reviewed and apart from HPI, are negative.  Past Medical History:  Diagnosis Date   Diabetes mellitus without complication (HCC)    Diabetic retinopathy (HCC)    PDR OU   Hypertension    Hypertensive retinopathy    OU   Stage 3b chronic kidney disease (HCC) 07/10/2020   Stroke (HCC) 2019    Past Surgical History:  Procedure Laterality Date   CATARACT EXTRACTION Bilateral    COLONOSCOPY     [facility] possibly but not sure. [place] [facility] it was negative   ESOPHAGOGASTRODUODENOSCOPY     around the same time said it was negative    EYE SURGERY Bilateral    Cat Sx OU   GALLBLADDER SURGERY      Social History:   reports that she has never smoked. She  has never used smokeless tobacco. She reports that she does not drink alcohol and does not use drugs.  Allergies  Allergen Reactions   Penicillins     Said she was young had a reaction    Sulfa Antibiotics Other (See Comments)    Pt unable to report reaction Said she had a rash    Family History  Problem Relation Age of Onset   Diabetes Mother    Breast cancer Sister    Diabetes Sister    Diabetes Daughter    Diabetes Brother    Colon cancer Neg Hx    Esophageal cancer Neg Hx    Rectal cancer Neg Hx    Stomach cancer Neg Hx      Prior to Admission medications   Medication Sig Start Date End Date Taking? Authorizing Provider  acetaminophen (TYLENOL) 500 MG tablet Take 500 mg by mouth daily as needed for mild pain (pain score 1-3).   Yes [provider]  losartan (COZAAR) 25 MG tablet TAKE 1 TABLET(25 MG) BY MOUTH DAILY 05/15/23  Yes [name], [name], NP  Multiple Vitamins-Minerals (MULTIVITAMIN ADULT PO) Take 1 tablet by mouth daily.   Yes [provider]  NON FORMULARY Omega XL supplement twice daily.   Yes [provider]  VITAMIN D PO Take 50 mg by mouth daily.   Yes [provider]  amLODipine (NORVASC) 5 MG tablet TAKE 1 TABLET(5 MG) BY MOUTH  DAILY 06/14/23   [name], [name], NP  Apoaequorin (PREVAGEN PO) Take 50 mcg by mouth daily.    [provider]  atorvastatin (LIPITOR) 40 MG tablet TAKE 1 TABLET(40 MG) BY MOUTH DAILY 08/15/22   [name], [name], NP  Blood Glucose Monitoring Suppl (ONE TOUCH ULTRA 2) w/Device KIT by Does not apply route.    [provider]  cimetidine (TAGAMET) 300 MG tablet TAKE 1 TABLET(300 MG) BY MOUTH AT BEDTIME 11/21/22   Mliss Sax, MD  clopidogrel (PLAVIX) 75 MG tablet TAKE 1 TABLET(75 MG) BY MOUTH DAILY 05/15/23   [name], [name], NP  docusate sodium (COLACE) 100 MG capsule TAKE 1 CAPSULE(100 MG) BY MOUTH DAILY 05/13/22   [name], [name], NP  ferrous sulfate 325 (65 FE) MG tablet  Take 1 tablet (325 mg total) by mouth 2 (two) times daily with a meal. 04/28/22   [name], [name], NP  fluticasone (FLONASE) 50 MCG/ACT nasal spray Place 2 sprays into both nostrils in the morning and at bedtime. 04/12/23   Read Drivers, MD  gabapentin (NEURONTIN) 300 MG capsule Take 1 capsule (300 mg total) by mouth 2 (two) times daily. 06/14/23   [name], [name], NP  glucose blood (ONETOUCH ULTRA) test strip TEST UPTO FOUR TIMES DAILY 05/01/23   [name], [name], NP  Glycerin-Polysorbate 80 (REFRESH DRY EYE THERAPY OP) Place 1-2 drops into both eyes as needed (for dryness).     [provider]  insulin glargine (LANTUS SOLOSTAR) 100 UNIT/ML Solostar Pen Inject 44 Units into the skin daily. 04/21/23   [name], [name], NP  insulin lispro (HUMALOG KWIKPEN) 100 UNIT/ML KwikPen ADMINISTER 0 TO 15 UNITS UNDER THE SKIN INTO SKIN THREE TIMES DAILY Patient taking differently: 18 Units. ADMINISTER 0 TO 18 UNITS UNDER THE SKIN INTO SKIN THREE TIMES DAILY 04/21/23   [name], [name], NP  Insulin Pen Needle (BD PEN NEEDLE NANO 2ND GEN) 32G X 4 MM MISC 1 Device by Other route 4 (four) times daily. E11.40 04/28/22   [name], [name], NP  Insulin Syringe-Needle U-100 (INSULIN SYRINGE 1CC/31GX5/16") 31G X 5/16" 1 ML MISC USE AS DIRECTED WITH LANTUS 02/27/20   Mliss Sax, MD    Physical Exam: Vitals:   06/19/23 0245 06/19/23 0300 06/19/23 0345 06/19/23 0405  BP: (!) 146/61 (!) 127/52 (!) 122/51 (!) 121/51  Pulse:   (!) 105 (!) 104  Resp:   18 16  Temp:   98.3 F (36.8 C) 98.6 F (37 C)  TempSrc:   Oral Oral  SpO2:   99% 100%  Weight:      Height:        Constitutional: NAD, calm  Eyes: PERTLA, lids and conjunctivae normal ENMT: Mucous membranes are moist. Posterior pharynx clear of any exudate or lesions.   Neck: supple, no masses  Respiratory: no wheezing, no crackles. No accessory muscle use.  Cardiovascular: S1 & S2 heard, regular rate and rhythm. Trace lower extremity  edema.   Abdomen: No distension, no tenderness, soft. Bowel sounds active.  Musculoskeletal: no clubbing / cyanosis. No joint deformity upper and lower extremities.   Skin: no significant rashes, lesions, ulcers. Warm, dry, well-perfused. Neurologic: CN 2-12 grossly intact. Moving all extremities. Alert and oriented.  Psychiatric: Pleasant. Cooperative.    Labs and Imaging on Admission: I have personally reviewed following labs and imaging studies  CBC: Recent Labs  Lab 06/18/23 2343 06/19/23 0159  WBC 11.8*  --   NEUTROABS 7.4  --  HGB 5.2* 4.8*  HCT 16.3* 15.3*  MCV 88.1  --   PLT 323  --    Basic Metabolic Panel: Recent Labs  Lab 06/18/23 2343  NA 137  K 3.9  CL 101  CO2 23  GLUCOSE 208*  BUN 45*  CREATININE 1.27*  CALCIUM 8.7*   GFR: Estimated Creatinine Clearance: 44.3 mL/min (A) (by C-G formula based on SCr of 1.27 mg/dL (H)). Liver Function Tests: Recent Labs  Lab 06/18/23 2343  AST 20  ALT 16  ALKPHOS 54  BILITOT 0.4  PROT 6.2*  ALBUMIN 3.0*   No results for input(s): "LIPASE", "AMYLASE" in the last 168 hours. No results for input(s): "AMMONIA" in the last 168 hours. Coagulation Profile: No results for input(s): "INR", "PROTIME" in the last 168 hours. Cardiac Enzymes: No results for input(s): "CKTOTAL", "CKMB", "CKMBINDEX", "TROPONINI" in the last 168 hours. BNP (last 3 results) No results for input(s): "PROBNP" in the last 8760 hours. HbA1C: No results for input(s): "HGBA1C" in the last 72 hours. CBG: Recent Labs  Lab 06/18/23 2229 06/19/23 0234  GLUCAP 180* 208*   Lipid Profile: No results for input(s): "CHOL", "HDL", "LDLCALC", "TRIG", "CHOLHDL", "LDLDIRECT" in the last 72 hours. Thyroid Function Tests: No results for input(s): "TSH", "T4TOTAL", "FREET4", "T3FREE", "THYROIDAB" in the last 72 hours. Anemia Panel: Recent Labs    06/19/23 0253  VITAMINB12 780  FOLATE 20.1  FERRITIN 16  TIBC 342  IRON 109  RETICCTPCT 4.4*   Urine  analysis:    Component Value Date/Time   COLORURINE STRAW (A) 06/19/2023 0036   APPEARANCEUR CLEAR 06/19/2023 0036   LABSPEC 1.008 06/19/2023 0036   PHURINE 5.0 06/19/2023 0036   GLUCOSEU NEGATIVE 06/19/2023 0036   GLUCOSEU NEGATIVE 09/24/2019 1342   HGBUR NEGATIVE 06/19/2023 0036   BILIRUBINUR NEGATIVE 06/19/2023 0036   KETONESUR NEGATIVE 06/19/2023 0036   PROTEINUR NEGATIVE 06/19/2023 0036   UROBILINOGEN 0.2 09/24/2019 1342   NITRITE NEGATIVE 06/19/2023 0036   LEUKOCYTESUR NEGATIVE 06/19/2023 0036   Sepsis Labs: @LABRCNTIP (procalcitonin:4,lacticidven:4) )No results found for this or any previous visit (from the past 240 hours).   Radiological Exams on Admission: CT ABDOMEN PELVIS W CONTRAST Result Date: 06/19/2023 CLINICAL DATA:  Upper GI bleed, melanotic stool, anemic EXAM: CT ABDOMEN AND PELVIS WITH CONTRAST TECHNIQUE: Multidetector CT imaging of the abdomen and pelvis was performed using the standard protocol following bolus administration of intravenous contrast. RADIATION DOSE REDUCTION: This exam was performed according to the departmental dose-optimization program which includes automated exposure control, adjustment of the mA and/or kV according to patient size and/or use of iterative reconstruction technique. CONTRAST:  75mL OMNIPAQUE IOHEXOL 350 MG/ML SOLN COMPARISON:  None Available. FINDINGS: Lower chest: No acute abnormality. Hepatobiliary: Unremarkable liver. Cholecystectomy. No biliary dilation. Pancreas: Unremarkable. Spleen: Unremarkable. Adrenals/Urinary Tract: Normal adrenal glands. Cortical renal scarring upper pole of the left kidney. No urinary calculi or hydronephrosis. Bladder is unremarkable. Stomach/Bowel: Normal caliber large and small bowel. No bowel wall thickening. The appendix is normal.Stomach is within normal limits. Vascular/Lymphatic: Aortic atherosclerosis. No enlarged abdominal or pelvic lymph nodes. Reproductive: Unremarkable. Other: No free  intraperitoneal fluid or air. Musculoskeletal: No acute fracture. IMPRESSION: 1. No acute abnormality in the abdomen or pelvis. Aortic Atherosclerosis (ICD10-I70.0). Electronically Signed   By: Minerva Fester M.D.   On: 06/19/2023 03:37    EKG: Independently reviewed. Sinus rhythm.   Assessment/Plan   1. Upper GI bleeding; symptomatic anemia  - Presents with lightheadedness and fatigue, Hgb is 5.2 (10.0 on January 8th),  and BUN is 45 (25 on January 8th)  - 3 units RBC ordered from ED and secure chat sent to [facility] with consult request  - Start IV PPI, continue bowel rest, hold Plavix, follow serial H&H   2. Hx of CVA  - Hx of ischemic right pontine CVA in 2019  - Hold Plavix    3. Type II DM  - A1c was 10.0% in January 2025  - Check CBGs, continue long-acting insulin with dose-reduction while NPO, use low-intensity SSI   4. CKD 3B  - Appears close to baseline  - Renally-dose medications   5. Hypertension  - Treat as-needed only for now    6. Elevated troponin  - Troponin only mildly elevated and flat; there is no chest discomfort  - Supply-demand mismatch in setting of severe anemia - Transfuse RBCs    DVT prophylaxis: SCDs  Code Status: Full  Level of Care: Level of care: Progressive Family Communication: none present  Disposition Plan:  Patient is from: home  Anticipated d/c is to: home  Anticipated d/c date is: 06/22/23  Patient currently: Pending stable H&H  Consults called: Secure chat sent to [facility] with request for consultation  Admission status: Inpatient     Briscoe Deutscher, MD [facility]  06/19/2023, 4:37 AM

## 2023-06-19 NOTE — Telephone Encounter (Signed)
Noted  

## 2023-06-19 NOTE — ED Notes (Signed)
Bloodwork was collected by alternate staff member. Patient is receiving blood so bloodwork was cancelled. Per Dr. Clide Dales, recollect all labs around 1600 today.

## 2023-06-19 NOTE — ED Notes (Signed)
ED TO INPATIENT HANDOFF REPORT  ED Nurse Name and Phone #: Yancey Flemings  S Name/Age/Gender Tara Conner 78 y.o. female Room/Bed: 007C/007C  Code Status   Code Status: Full Code  Home/SNF/Other Home Patient oriented to: self, place, time, and situation Is this baseline? Yes   Triage Complete: Triage complete  Chief Complaint Symptomatic anemia [D64.9]  Triage Note Pt reports that she has had an ear infection x3 weeks. Woke up this morning w/ dizziness, and muffled right ear. No pain reported at this time. Pt A&Ox4. Hx of DM, HTN, and recent stroke per EMS.    Allergies Allergies  Allergen Reactions   Penicillins     Said she was young had a reaction    Sulfa Antibiotics Other (See Comments)    Pt unable to report reaction Said she had a rash    Level of Care/Admitting Diagnosis ED Disposition     ED Disposition  Admit   Condition  --   Comment  Hospital Area: [facility] [[id number]]  Level of Care: Progressive [102]  Admit to Progressive based on following criteria: GI, ENDOCRINE disease patients with GI bleeding, acute liver failure or pancreatitis, stable with diabetic ketoacidosis or thyrotoxicosis (hypothyroid) state.  May admit patient to [facility] or [facility] if equivalent level of care is available:: Yes  Covid Evaluation: Asymptomatic - no recent exposure (last 10 days) testing not required  Diagnosis: Symptomatic anemia [[id number]]  Admitting Physician: Briscoe Deutscher [[id number]]  Attending Physician: Briscoe Deutscher [[id number]]  Certification:: I certify this patient will need inpatient services for at least 2 midnights  Expected Medical Readiness: 06/22/2023          B Medical/Surgery History Past Medical History:  Diagnosis Date   Diabetes mellitus without complication (HCC)    Diabetic retinopathy (HCC)    PDR OU   Hypertension    Hypertensive retinopathy    OU   Stage 3b chronic kidney disease (HCC) 07/10/2020    Stroke (HCC) 2019   Past Surgical History:  Procedure Laterality Date   CATARACT EXTRACTION Bilateral    COLONOSCOPY     [facility] possibly but not sure. [place] [facility] it was negative   ESOPHAGOGASTRODUODENOSCOPY     around the same time said it was negative    EYE SURGERY Bilateral    Cat Sx OU   GALLBLADDER SURGERY       A IV Location/Drains/Wounds Patient Lines/Drains/Airways Status     Active Line/Drains/Airways     Name Placement date Placement time Site Days   Peripheral IV 06/19/23 20 G Distal;Posterior;Right Forearm 06/19/23  0039  Forearm  less than 1   Peripheral IV 06/19/23 20 G Left;Posterior Hand 06/19/23  0421  Hand  less than 1            Intake/Output Last 24 hours  Intake/Output Summary (Last 24 hours) at 06/19/2023 1954 Last data filed at 06/19/2023 1046 Gross per 24 hour  Intake 1700.25 ml  Output --  Net 1700.25 ml    Labs/Imaging Results for orders placed or performed during the hospital encounter of 06/18/23 (from the past 48 hours)  CBG monitoring, ED     Status: Abnormal   Collection Time: 06/18/23 10:29 PM  Result Value Ref Range   Glucose-Capillary 180 (H) 70 - 99 mg/dL    Comment: Glucose reference range applies only to samples taken after fasting for at least 8 hours.  CBC with Differential     Status: Abnormal  Collection Time: 06/18/23 11:43 PM  Result Value Ref Range   WBC 11.8 (H) 4.0 - 10.5 K/uL   RBC 1.85 (L) 3.87 - 5.11 MIL/uL   Hemoglobin 5.2 (LL) 12.0 - 15.0 g/dL    Comment: REPEATED TO VERIFY THIS CRITICAL RESULT HAS VERIFIED AND BEEN CALLED TO F. SIMS, RN BY JEZREEL LAS IGAN ON 01 20 2025 AT 0115, AND HAS BEEN READ BACK.     HCT 16.3 (L) 36.0 - 46.0 %   MCV 88.1 80.0 - 100.0 fL   MCH 28.1 26.0 - 34.0 pg   MCHC 31.9 30.0 - 36.0 g/dL   RDW 95.2 84.1 - 32.4 %   Platelets 323 150 - 400 K/uL   nRBC 0.2 0.0 - 0.2 %   Neutrophils Relative % 62 %   Neutro Abs 7.4 1.7 - 7.7 K/uL   Lymphocytes Relative 26 %   Lymphs Abs 3.1  0.7 - 4.0 K/uL   Monocytes Relative 8 %   Monocytes Absolute 1.0 0.1 - 1.0 K/uL   Eosinophils Relative 2 %   Eosinophils Absolute 0.2 0.0 - 0.5 K/uL   Basophils Relative 1 %   Basophils Absolute 0.1 0.0 - 0.1 K/uL   Immature Granulocytes 1 %   Abs Immature Granulocytes 0.13 (H) 0.00 - 0.07 K/uL    Comment: Performed at [facility], [address] [place], [place] [address]  Comprehensive metabolic panel     Status: Abnormal   Collection Time: 06/18/23 11:43 PM  Result Value Ref Range   Sodium 137 135 - 145 mmol/L   Potassium 3.9 3.5 - 5.1 mmol/L   Chloride 101 98 - 111 mmol/L   CO2 23 22 - 32 mmol/L   Glucose, Bld 208 (H) 70 - 99 mg/dL    Comment: Glucose reference range applies only to samples taken after fasting for at least 8 hours.   BUN 45 (H) 8 - 23 mg/dL   Creatinine, Ser 7.25 (H) 0.44 - 1.00 mg/dL   Calcium 8.7 (L) 8.9 - 10.3 mg/dL   Total Protein 6.2 (L) 6.5 - 8.1 g/dL   Albumin 3.0 (L) 3.5 - 5.0 g/dL   AST 20 15 - 41 U/L   ALT 16 0 - 44 U/L   Alkaline Phosphatase 54 38 - 126 U/L   Total Bilirubin 0.4 0.0 - 1.2 mg/dL   GFR, Estimated 44 (L) >60 mL/min    Comment: (NOTE) Calculated using the CKD-EPI Creatinine Equation (2021)    Anion gap 13 5 - 15    Comment: Performed at [facility], [address], [place], [place] [address]  Troponin I (High Sensitivity)     Status: Abnormal   Collection Time: 06/18/23 11:43 PM  Result Value Ref Range   Troponin I (High Sensitivity) 42 (H) <18 ng/L    Comment: (NOTE) Elevated high sensitivity troponin I (hsTnI) values and significant  changes across serial measurements may suggest ACS but many other  chronic and acute conditions are known to elevate hsTnI results.  Refer to the "Links" section for chest pain algorithms and additional  guidance. Performed at [facility], [address], [place], [place] [address]   ABO/Rh     Status: None   Collection Time: 06/18/23 11:43 PM  Result Value Ref Range    ABO/RH(D)      B POS Performed at [facility], [address], [place], [place] [address]   Urinalysis, Routine w reflex microscopic -Urine,  Clean Catch     Status: Abnormal   Collection Time: 06/19/23 12:36 AM  Result Value Ref Range   Color, Urine STRAW (A) YELLOW   APPearance CLEAR CLEAR   Specific Gravity, Urine 1.008 1.005 - 1.030   pH 5.0 5.0 - 8.0   Glucose, UA NEGATIVE NEGATIVE mg/dL   Hgb urine dipstick NEGATIVE NEGATIVE   Bilirubin Urine NEGATIVE NEGATIVE   Ketones, ur NEGATIVE NEGATIVE mg/dL   Protein, ur NEGATIVE NEGATIVE mg/dL   Nitrite NEGATIVE NEGATIVE   Leukocytes,Ua NEGATIVE NEGATIVE    Comment: Performed at [facility], [address], [place], [place] [address]  Type and screen [facility]     Status: None (Preliminary result)   Collection Time: 06/19/23  1:50 AM  Result Value Ref Range   ABO/RH(D) B POS    Antibody Screen NEG    Sample Expiration 06/22/2023,2359    Unit Number [id number]    Blood Component Type RED CELLS,LR    Unit division 00    Status of Unit ISSUED    Transfusion Status OK TO TRANSFUSE    Crossmatch Result      Compatible Performed at [facility], [address] [place], [place] [address]    Unit Number [id number]    Blood Component Type RBC LR PHER1    Unit division 00    Status of Unit ISSUED    Transfusion Status OK TO TRANSFUSE    Crossmatch Result Compatible    Unit Number [id number]    Blood Component Type RED CELLS,LR    Unit division 00    Status of Unit ISSUED    Transfusion Status OK TO TRANSFUSE    Crossmatch Result Compatible   Troponin I (High Sensitivity)     Status: Abnormal   Collection Time: 06/19/23  1:59 AM  Result Value Ref Range   Troponin I (High Sensitivity) 43 (H) <18 ng/L    Comment: (NOTE) Elevated high sensitivity troponin I (hsTnI) values and significant  changes across serial measurements may suggest ACS but many other  chronic and acute conditions  are known to elevate hsTnI results.  Refer to the "Links" section for chest pain algorithms and additional  guidance. Performed at [facility], [address] [place], [place] [address]   Hemoglobin and hematocrit, blood     Status: Abnormal   Collection Time: 06/19/23  1:59 AM  Result Value Ref Range   Hemoglobin 4.8 (LL) 12.0 - 15.0 g/dL    Comment: REPEATED TO VERIFY THIS CRITICAL RESULT HAS VERIFIED AND BEEN CALLED TO R. SMITH, RN BY JEZREEL LAS IGAN ON 01 20 2025 AT 0229, AND HAS BEEN READ BACK.     HCT 15.3 (L) 36.0 - 46.0 %    Comment: Performed at [facility], [address], [place], [place] [address]  CBG monitoring, ED     Status: Abnormal   Collection Time: 06/19/23  2:34 AM  Result Value Ref Range   Glucose-Capillary 208 (H) 70 - 99 mg/dL    Comment: Glucose reference range applies only to samples taken after fasting for at least 8 hours.  Prepare RBC (crossmatch)     Status: None   Collection Time: 06/19/23  2:44 AM  Result Value Ref Range   Order Confirmation      ORDER PROCESSED BY BLOOD BANK Performed at [facility], [address] [place], [place] [address]   Vitamin B12  Status: None   Collection Time: 06/19/23  2:53 AM  Result Value Ref Range   Vitamin B-12 780 180 - 914 pg/mL    Comment: (NOTE) This assay is not validated for testing neonatal or myeloproliferative syndrome specimens for Vitamin B12 levels. Performed at [facility], [address], [place], [place] [address]   Folate     Status: None   Collection Time: 06/19/23  2:53 AM  Result Value Ref Range   Folate 20.1 >5.9 ng/mL    Comment: Performed at [facility], [address], [place], [place] [address]  Iron and TIBC     Status: Abnormal   Collection Time: 06/19/23  2:53 AM  Result Value Ref Range   Iron 109 28 - 170 ug/dL   TIBC 098 119 - 147 ug/dL   Saturation Ratios 32 (H) 10.4 - 31.8 %   UIBC 233 ug/dL    Comment: Performed at [facility], [address], [place], [place] [address]  Ferritin     Status: None   Collection Time: 06/19/23  2:53 AM  Result Value Ref Range   Ferritin 16 11 - 307 ng/mL    Comment: Performed at [facility], [address], [place], [place] [address]  Reticulocytes     Status: Abnormal   Collection Time: 06/19/23  2:53 AM  Result Value Ref Range   Retic Ct Pct 4.4 (H) 0.4 - 3.1 %   RBC. 1.75 (L) 3.87 - 5.11 MIL/uL   Retic Count, Absolute 77.7 19.0 - 186.0 K/uL   Immature Retic Fract 39.9 (H) 2.3 - 15.9 %    Comment: Performed at [facility], [address] [place], [place] [address]  POC occult blood, ED     Status: Abnormal   Collection Time: 06/19/23  3:05 AM  Result Value Ref Range   Fecal Occult Bld POSITIVE (A) NEGATIVE  CBG monitoring, ED     Status: Abnormal   Collection Time: 06/19/23  5:07 AM  Result Value Ref Range   Glucose-Capillary 248 (H) 70 - 99 mg/dL    Comment: Glucose reference range applies only to samples taken after fasting for at least 8 hours.  CBG monitoring, ED     Status: Abnormal   Collection Time: 06/19/23  8:30 AM  Result Value Ref Range   Glucose-Capillary 207 (H) 70 - 99 mg/dL    Comment: Glucose reference range applies only to samples taken after fasting for at least 8 hours.  CBG monitoring, ED     Status: Abnormal   Collection Time: 06/19/23 12:43 PM  Result Value Ref Range   Glucose-Capillary 174 (H) 70 - 99 mg/dL    Comment: Glucose reference range applies only to samples taken after fasting for at least 8 hours.  CBG monitoring, ED     Status: Abnormal   Collection Time: 06/19/23  3:56 PM  Result Value Ref Range   Glucose-Capillary 295 (H) 70 - 99 mg/dL    Comment: Glucose reference range applies only to samples taken after fasting for at least 8 hours.  CBC     Status: Abnormal   Collection Time: 06/19/23  5:41 PM  Result Value Ref Range   WBC 11.6 (H) 4.0 - 10.5 K/uL   RBC 3.36 (L) 3.87 - 5.11 MIL/uL   Hemoglobin 9.6 (L)  12.0 - 15.0 g/dL    Comment: REPEATED TO VERIFY POST TRANSFUSION SPECIMEN    HCT 28.7 (L)  36.0 - 46.0 %   MCV 85.4 80.0 - 100.0 fL   MCH 28.6 26.0 - 34.0 pg   MCHC 33.4 30.0 - 36.0 g/dL   RDW 62.9 52.8 - 41.3 %   Platelets 278 150 - 400 K/uL   nRBC 0.3 (H) 0.0 - 0.2 %    Comment: Performed at [facility], [address], [place], [place] [address]  Basic metabolic panel     Status: Abnormal   Collection Time: 06/19/23  5:41 PM  Result Value Ref Range   Sodium 136 135 - 145 mmol/L   Potassium 4.0 3.5 - 5.1 mmol/L   Chloride 104 98 - 111 mmol/L   CO2 22 22 - 32 mmol/L   Glucose, Bld 217 (H) 70 - 99 mg/dL    Comment: Glucose reference range applies only to samples taken after fasting for at least 8 hours.   BUN 35 (H) 8 - 23 mg/dL   Creatinine, Ser 0.27 (H) 0.44 - 1.00 mg/dL   Calcium 8.7 (L) 8.9 - 10.3 mg/dL   GFR, Estimated 53 (L) >60 mL/min    Comment: (NOTE) Calculated using the CKD-EPI Creatinine Equation (2021)    Anion gap 10 5 - 15    Comment: Performed at [facility], [address] [place], [place] [address]  Magnesium     Status: None   Collection Time: 06/19/23  5:41 PM  Result Value Ref Range   Magnesium 1.8 1.7 - 2.4 mg/dL    Comment: Performed at [facility], [address], [place], [place] [address]   CT ABDOMEN PELVIS W CONTRAST Result Date: 06/19/2023 CLINICAL DATA:  Upper GI bleed, melanotic stool, anemic EXAM: CT ABDOMEN AND PELVIS WITH CONTRAST TECHNIQUE: Multidetector CT imaging of the abdomen and pelvis was performed using the standard protocol following bolus administration of intravenous contrast. RADIATION DOSE REDUCTION: This exam was performed according to the departmental dose-optimization program which includes automated exposure control, adjustment of the mA and/or kV according to patient size and/or use of iterative reconstruction technique. CONTRAST:  75mL OMNIPAQUE IOHEXOL 350 MG/ML SOLN COMPARISON:  None Available. FINDINGS:  Lower chest: No acute abnormality. Hepatobiliary: Unremarkable liver. Cholecystectomy. No biliary dilation. Pancreas: Unremarkable. Spleen: Unremarkable. Adrenals/Urinary Tract: Normal adrenal glands. Cortical renal scarring upper pole of the left kidney. No urinary calculi or hydronephrosis. Bladder is unremarkable. Stomach/Bowel: Normal caliber large and small bowel. No bowel wall thickening. The appendix is normal.Stomach is within normal limits. Vascular/Lymphatic: Aortic atherosclerosis. No enlarged abdominal or pelvic lymph nodes. Reproductive: Unremarkable. Other: No free intraperitoneal fluid or air. Musculoskeletal: No acute fracture. IMPRESSION: 1. No acute abnormality in the abdomen or pelvis. Aortic Atherosclerosis (ICD10-I70.0). Electronically Signed   By: Minerva Fester M.D.   On: 06/19/2023 03:37    Pending Labs Unresulted Labs (From admission, onward)     Start     Ordered   06/19/23 1610  CBC  Now then every 6 hours,   R (with TIMED occurrences)     Comments: Call MD for hemoglobin 7 or less    06/19/23 0908   06/19/23 0500  Basic metabolic panel  Daily,   R      06/19/23 0410            Vitals/Pain Today's Vitals   06/19/23 1600 06/19/23 1615 06/19/23 1837 06/19/23 1900  BP: (!) 136/115 (!) 154/73  (!) 152/68  Pulse: 88 93  95  Resp: 17 13  (!) 22  Temp:  98.3 F (36.8 C)   TempSrc:   Oral   SpO2: 100% 100%  100%  Weight:      Height:      PainSc:        Isolation Precautions No active isolations  Medications Medications  sodium chloride flush (NS) 0.9 % injection 3 mL (3 mLs Intravenous Not Given 06/19/23 0905)  acetaminophen (TYLENOL) tablet 650 mg (has no administration in time range)    Or  acetaminophen (TYLENOL) suppository 650 mg (has no administration in time range)  prochlorperazine (COMPAZINE) injection 5 mg (has no administration in time range)  0.9 %  sodium chloride infusion ( Intravenous New Bag/Given 06/19/23 0441)  pantoprazole  (PROTONIX) injection 40 mg (40 mg Intravenous Given 06/19/23 1629)  insulin glargine-yfgn (SEMGLEE) injection 10 Units (10 Units Subcutaneous Given 06/19/23 0915)  insulin aspart (novoLOG) injection 0-6 Units (3 Units Subcutaneous Given 06/19/23 1629)  lactated ringers bolus 1,000 mL (0 mLs Intravenous Stopped 06/19/23 0214)  0.9 %  sodium chloride infusion (Manually program via Guardrails IV Fluids) (0 mLs Intravenous Stopped 06/19/23 0718)  iohexol (OMNIPAQUE) 350 MG/ML injection 75 mL (75 mLs Intravenous Contrast Given 06/19/23 0331)  pantoprazole (PROTONIX) injection 80 mg (80 mg Intravenous Given 06/19/23 0443)    Mobility walks with person assist     Focused Assessments Neuro Assessment Handoff:   Cardiac Rhythm: Sinus tachycardia       Neuro Assessment: Within Defined Limits Neuro Checks:     R Recommendations: See Admitting Provider Note  Report given to:   Additional Notes:

## 2023-06-19 NOTE — ED Provider Notes (Incomplete)
12:22 AM Assumed care from ***, please see their note for full history, physical and decision making until this point. In brief this is a 78 y.o. year old female who presented to the ED tonight with Dizziness      Discharge instructions, including strict return precautions for new or worsening symptoms, given. Patient and/or family verbalized understanding and agreement with the plan as described.   Labs, studies and imaging reviewed by myself and considered in medical decision making if ordered. Imaging interpreted by radiology.  Labs Reviewed  CBG MONITORING, ED - Abnormal; Notable for the following components:      Result Value   Glucose-Capillary 180 (*)    All other components within normal limits  CBC WITH DIFFERENTIAL/PLATELET  COMPREHENSIVE METABOLIC PANEL  URINALYSIS, ROUTINE W REFLEX MICROSCOPIC  TROPONIN I (HIGH SENSITIVITY)    No orders to display    No follow-ups on file.

## 2023-06-19 NOTE — Progress Notes (Signed)
Courtesy note:  Patient is seen and examined today morning.  She is admitted to the hospitalist service for further management evaluation of acute blood loss anemia, upper GI bleeding.  Patient is currently receiving 3 units of blood.  GI evaluated the patient, recommended EGD once Plavix washed out.  Continue IV PPI twice daily.  Patient will be closely monitored for acute bleeding, drop in H&H.  Monitor H&H closely and transfuse as needed to keep hemoglobin greater than 7.  Further management pending GI workup.

## 2023-06-19 NOTE — Consult Note (Addendum)
Consultation  Referring Provider: ERMD/Ransom PA-C Primary Care Physician:  [name], [name], NP Primary Gastroenterologist:  Dr. Barron Alvine  Reason for Consultation: Melena, weakness, anemia  HPI: Tara Conner is a 78 y.o. female, established with Dr. Barron Alvine, and last seen in 2022.  Patient presented to the emergency room early in the morning hours with complaints of dizziness and lightheadedness onset yesterday.  She says she was feeling fine earlier in the week.  On further questioning she admitted that she had been seeing dark stools yesterday and did have 3-4 bowel movements at home which was more than usual.  She attributed this to a new diet that she has been on which involves drinking okra water.  She has not had any abdominal discomfort recently, no complaints of nausea or vomiting.  She is not on any aspirin or NSAIDs but does take Plavix on a daily basis.  She has history of chronic iron deficiency anemia and is on chronic oral iron.  She and her daughter did not think she was on any H2 blocker or PPI but her med list has Tagamet. Labs in the ER revealed hemoglobin of 5.2 down from her baseline of 10 with last check on 06/07/2023. Potassium 3.9/BUN 45/creatinine 1.27 LFTs within normal limits Ferritin 16/iron 109/TIBC 342/iron sat 32 B12 -780 Troponin 42> 43  She was being transfused 3 units of packed RBCs on her second unit currently.  She has been hemodynamically stable, hypertensive and mildly tachycardic.  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 Colonoscopy at that same setting with excessive looping in the right colon otherwise negative exam, grade 2 internal hemorrhoids.   Past Medical History:  Diagnosis Date   Diabetes mellitus without  complication (HCC)    Diabetic retinopathy (HCC)    PDR OU   Hypertension    Hypertensive retinopathy    OU   Stage 3b chronic kidney disease (HCC) 07/10/2020   Stroke (HCC) 2019    Past Surgical History:  Procedure Laterality Date   CATARACT EXTRACTION Bilateral    COLONOSCOPY     [facility] possibly but not sure. [place] [facility] it was negative   ESOPHAGOGASTRODUODENOSCOPY     around the same time said it was negative    EYE SURGERY Bilateral    Cat Sx OU   GALLBLADDER SURGERY      Prior to Admission medications   Medication Sig Start Date End Date Taking? Authorizing Provider  acetaminophen (TYLENOL) 500 MG tablet Take 500 mg by mouth daily as needed for mild pain (pain score 1-3).   Yes [provider]  atorvastatin (LIPITOR) 40 MG tablet TAKE 1 TABLET(40 MG) BY MOUTH DAILY 08/15/22  Yes [name], [name], NP  Chlorphen-Phenyleph-ASA (ALKA-SELTZER PLUS COLD PO) Take 1 tablet by mouth at bedtime as needed (for indigestion).   Yes [provider]  cimetidine (TAGAMET) 300 MG tablet TAKE 1 TABLET(300 MG) BY MOUTH AT BEDTIME Patient taking differently: Take 300 mg by mouth at bedtime as needed. 11/21/22  Yes Mliss Sax, MD  clopidogrel (PLAVIX) 75 MG tablet TAKE 1 TABLET(75 MG) BY MOUTH DAILY 05/15/23  Yes [name], [name], NP  ferrous sulfate 325 (65 FE) MG tablet Take 1 tablet (325 mg total) by mouth 2 (two) times daily with a meal. Patient taking differently:  Take 325 mg by mouth daily with breakfast. 04/28/22  Yes [name], [name], NP  gabapentin (NEURONTIN) 300 MG capsule Take 1 capsule (300 mg total) by mouth 2 (two) times daily. 06/14/23  Yes [name], [name], NP  Glycerin-Polysorbate 80 (REFRESH DRY EYE THERAPY OP) Place 1-2 drops into both eyes as needed (for dryness).    Yes [provider]  insulin glargine (LANTUS SOLOSTAR) 100 UNIT/ML Solostar Pen Inject 44 Units into the skin daily. Patient taking differently: Inject 42 Units into the  skin daily. 04/21/23  Yes [name], [name], NP  losartan (COZAAR) 25 MG tablet TAKE 1 TABLET(25 MG) BY MOUTH DAILY 05/15/23  Yes [name], [name], NP  Multiple Vitamins-Minerals (MULTIVITAMIN ADULT PO) Take 1 tablet by mouth daily.   Yes [provider]  NON FORMULARY Omega XL supplement twice daily.   Yes [provider]  VITAMIN D PO Take 50 mg by mouth daily.   Yes [provider]  amLODipine (NORVASC) 5 MG tablet TAKE 1 TABLET(5 MG) BY MOUTH DAILY 06/14/23   [name], [name], NP  Blood Glucose Monitoring Suppl (ONE TOUCH ULTRA 2) w/Device KIT by Does not apply route.    [provider]  fluticasone (FLONASE) 50 MCG/ACT nasal spray Place 2 sprays into both nostrils in the morning and at bedtime. Patient not taking: Reported on 06/19/2023 04/12/23   Jovita Kussmaul B, MD  glucose blood (ONETOUCH ULTRA) test strip TEST UPTO FOUR TIMES DAILY 05/01/23   [name], [name], NP  insulin lispro (HUMALOG KWIKPEN) 100 UNIT/ML KwikPen ADMINISTER 0 TO 15 UNITS UNDER THE SKIN INTO SKIN THREE TIMES DAILY Patient taking differently: 14 Units. ADMINISTER 0 TO 18 UNITS UNDER THE SKIN INTO SKIN THREE TIMES DAILY 04/21/23   [name], [name], NP  Insulin Pen Needle (BD PEN NEEDLE NANO 2ND GEN) 32G X 4 MM MISC 1 Device by Other route 4 (four) times daily. E11.40 04/28/22   [name], [name], NP  Insulin Syringe-Needle U-100 (INSULIN SYRINGE 1CC/31GX5/16") 31G X 5/16" 1 ML MISC USE AS DIRECTED WITH LANTUS 02/27/20   Mliss Sax, MD    Current Facility-Administered Medications  Medication Dose Route Frequency Provider Last Rate Last Admin   0.9 %  sodium chloride infusion   Intravenous Continuous Opyd, Lavone Neri, MD 50 mL/hr at 06/19/23 0441 New Bag at 06/19/23 0441   acetaminophen (TYLENOL) tablet 650 mg  650 mg Oral Q6H PRN Opyd, Lavone Neri, MD       Or   acetaminophen (TYLENOL) suppository 650 mg  650 mg Rectal Q6H PRN Opyd, Lavone Neri, MD       insulin aspart (novoLOG)  injection 0-6 Units  0-6 Units Subcutaneous Q4H Opyd, Lavone Neri, MD   2 Units at 06/19/23 0844   insulin glargine-yfgn (SEMGLEE) injection 10 Units  10 Units Subcutaneous Daily Opyd, Lavone Neri, MD       pantoprazole (PROTONIX) injection 40 mg  40 mg Intravenous Q12H Opyd, Lavone Neri, MD       prochlorperazine (COMPAZINE) injection 5 mg  5 mg Intravenous Q4H PRN Opyd, Lavone Neri, MD       sodium chloride flush (NS) 0.9 % injection 3 mL  3 mL Intravenous Q12H Opyd, Lavone Neri, MD       Current Outpatient Medications  Medication Sig Dispense Refill   acetaminophen (TYLENOL) 500 MG tablet Take 500 mg by mouth daily as needed for mild pain (pain score 1-3).     atorvastatin (LIPITOR) 40 MG  tablet TAKE 1 TABLET(40 MG) BY MOUTH DAILY 100 tablet 2   Chlorphen-Phenyleph-ASA (ALKA-SELTZER PLUS COLD PO) Take 1 tablet by mouth at bedtime as needed (for indigestion).     cimetidine (TAGAMET) 300 MG tablet TAKE 1 TABLET(300 MG) BY MOUTH AT BEDTIME (Patient taking differently: Take 300 mg by mouth at bedtime as needed.) 90 tablet 4   clopidogrel (PLAVIX) 75 MG tablet TAKE 1 TABLET(75 MG) BY MOUTH DAILY 100 tablet 1   ferrous sulfate 325 (65 FE) MG tablet Take 1 tablet (325 mg total) by mouth 2 (two) times daily with a meal. (Patient taking differently: Take 325 mg by mouth daily with breakfast.) 90 tablet 1   gabapentin (NEURONTIN) 300 MG capsule Take 1 capsule (300 mg total) by mouth 2 (two) times daily. 60 capsule 3   Glycerin-Polysorbate 80 (REFRESH DRY EYE THERAPY OP) Place 1-2 drops into both eyes as needed (for dryness).      insulin glargine (LANTUS SOLOSTAR) 100 UNIT/ML Solostar Pen Inject 44 Units into the skin daily. (Patient taking differently: Inject 42 Units into the skin daily.) 15 mL 4   losartan (COZAAR) 25 MG tablet TAKE 1 TABLET(25 MG) BY MOUTH DAILY 90 tablet 1   Multiple Vitamins-Minerals (MULTIVITAMIN ADULT PO) Take 1 tablet by mouth daily.     NON FORMULARY Omega XL supplement twice daily.      VITAMIN D PO Take 50 mg by mouth daily.     amLODipine (NORVASC) 5 MG tablet TAKE 1 TABLET(5 MG) BY MOUTH DAILY 90 tablet 1   Blood Glucose Monitoring Suppl (ONE TOUCH ULTRA 2) w/Device KIT by Does not apply route.     fluticasone (FLONASE) 50 MCG/ACT nasal spray Place 2 sprays into both nostrils in the morning and at bedtime. (Patient not taking: Reported on 06/19/2023) 11 mL 6   glucose blood (ONETOUCH ULTRA) test strip TEST UPTO FOUR TIMES DAILY 100 strip 3   insulin lispro (HUMALOG KWIKPEN) 100 UNIT/ML KwikPen ADMINISTER 0 TO 15 UNITS UNDER THE SKIN INTO SKIN THREE TIMES DAILY (Patient taking differently: 14 Units. ADMINISTER 0 TO 18 UNITS UNDER THE SKIN INTO SKIN THREE TIMES DAILY) 18 mL 4   Insulin Pen Needle (BD PEN NEEDLE NANO 2ND GEN) 32G X 4 MM MISC 1 Device by Other route 4 (four) times daily. E11.40 200 each 1   Insulin Syringe-Needle U-100 (INSULIN SYRINGE 1CC/31GX5/16") 31G X 5/16" 1 ML MISC USE AS DIRECTED WITH LANTUS 100 each 5    Allergies as of 06/18/2023 - Review Complete 06/18/2023  Allergen Reaction Noted   Penicillins  11/02/2018   Sulfa antibiotics Other (See Comments) 04/07/2018    Family History  Problem Relation Age of Onset   Diabetes Mother    Breast cancer Sister    Diabetes Sister    Diabetes Daughter    Diabetes Brother    Colon cancer Neg Hx    Esophageal cancer Neg Hx    Rectal cancer Neg Hx    Stomach cancer Neg Hx     Social History   Socioeconomic History   Marital status: Single    Spouse name: Not on file   Number of children: Not on file   Years of education: Not on file   Highest education level: Not on file  Occupational History   Not on file  Tobacco Use   Smoking status: Never   Smokeless tobacco: Never  Vaping Use   Vaping status: Never Used  Substance and Sexual Activity   Alcohol use:  Never   Drug use: Never   Sexual activity: Not Currently  Other Topics Concern   Not on file  Social History Narrative   Not on file    Social Drivers of Health   Financial Resource Strain: Low Risk  (09/22/2021)   Overall Financial Resource Strain (CARDIA)    Difficulty of Paying Living Expenses: Not hard at all  Food Insecurity: No Food Insecurity (09/22/2021)   Hunger Vital Sign    Worried About Running Out of Food in the Last Year: Never true    Ran Out of Food in the Last Year: Never true  Transportation Needs: No Transportation Needs (09/22/2021)   PRAPARE - Administrator, Civil Service (Medical): No    Lack of Transportation (Non-Medical): No  Physical Activity: Insufficiently Active (09/22/2021)   Exercise Vital Sign    Days of Exercise per Week: 2 days    Minutes of Exercise per Session: 20 min  Stress: No Stress Concern Present (09/22/2021)   [employer] of Occupational Health - Occupational Stress Questionnaire    Feeling of Stress : Not at all  Social Connections: Moderately Isolated (09/22/2021)   Social Connection and Isolation Panel [NHANES]    Frequency of Communication with Friends and Family: Three times a week    Frequency of Social Gatherings with Friends and Family: Three times a week    Attends Religious Services: More than 4 times per year    Active Member of Clubs or Organizations: No    Attends Banker Meetings: Never    Marital Status: Divorced  Catering manager Violence: Not At Risk (09/22/2021)   Humiliation, Afraid, Rape, and Kick questionnaire    Fear of Current or Ex-Partner: No    Emotionally Abused: No    Physically Abused: No    Sexually Abused: No    Review of Systems: Pertinent positive and negative review of systems were noted in the above HPI section.  All other review of systems was otherwise negative.   Physical Exam: Vital signs in last 24 hours: Temp:  [98.1 F (36.7 C)-98.6 F (37 C)] 98.4 F (36.9 C) (01/20 0832) Pulse Rate:  [97-106] 97 (01/20 0846) Resp:  [15-20] 15 (01/20 0846) BP: (121-151)/(51-115) 139/63 (01/20 0846) SpO2:   [97 %-100 %] 100 % (01/20 0846) Weight:  [93.9 kg] 93.9 kg (01/19 2141)   General:   Alert,  Well-developed, well-nourished, elderly African-American female pleasant and cooperative in NAD Head:  Normocephalic and atraumatic. Eyes:  Sclera clear, no icterus.   Conjunctiva nail Ears:  Normal auditory acuity. Nose:  No deformity, discharge,  or lesions. Mouth:  No deformity or lesions.   Neck:  Supple; no masses or thyromegaly. Lungs:  Clear throughout to auscultation.   No wheezes, crackles, or rhonchi.  Heart:  regular rate and rhythm; no murmurs, clicks, rubs,  or gallops.  Mild tachycardia pulse in the 90s Abdomen:  Soft,nontender, obese,, BS active,nonpalp mass or hsm.   Rectal: Done, documented melena in ER Msk:  Symmetrical without gross deformities. . Pulses:  Normal pulses noted. Extremities:  Without clubbing or edema. Neurologic:  Alert and  oriented x4;  grossly normal neurologically. Skin:  Intact without significant lesions or rashes.. Psych:  Alert and cooperative. Normal mood and affect.  Intake/Output from previous day: 01/19 0701 - 01/20 0700 In: 1000 [IV Piggyback:1000] Out: -  Intake/Output this shift: Total I/O In: 350 [Blood:350] Out: -   Lab Results: Recent Labs    06/18/23 2343  06/19/23 0159  WBC 11.8*  --   HGB 5.2* 4.8*  HCT 16.3* 15.3*  PLT 323  --    BMET Recent Labs    06/18/23 2343  NA 137  K 3.9  CL 101  CO2 23  GLUCOSE 208*  BUN 45*  CREATININE 1.27*  CALCIUM 8.7*   LFT Recent Labs    06/18/23 2343  PROT 6.2*  ALBUMIN 3.0*  AST 20  ALT 16  ALKPHOS 54  BILITOT 0.4   PT/INR No results for input(s): "LABPROT", "INR" in the last 72 hours. Hepatitis Panel No results for input(s): "HEPBSAG", "HCVAB", "HEPAIGM", "HEPBIGM" in the last 72 hours.     IMPRESSION:  #21 78 year old African-American female with multiple comorbidities, on chronic Plavix who presents with dizziness and lightheadedness onset yesterday.  Patient had  not been paying attention to her bowel movements over the past several days but on further questioning did say that she saw dark stools yesterday and had 3-4 bowel movements yesterday.  Labs in the ER revealed hemoglobin of 5.2, documented melena/heme positive  Last dose of Plavix yesterday morning  Elevated BUN, and melena both suggestive of upper GI source, she does have previous history of a pyloric channel ulcer and a duodenal AVM which was treated with APC at EGD in 2021. Rule out recurrent peptic ulcer disease, chronic gastropathy, bleeding secondary to AVMs  #2 chronic iron deficiency anemia likely secondary to AVMs-prior workup as outlined above 2021 with negative colonoscopy #3 history of CVA 2019-on chronic Plavix #4.  Component of chronic anemia with chronic kidney disease stage IIIb- Hemoglobin in the 10 range #5 diabetes mellitus #6.  Hypertension #7.  Status post cholecystectomy  Plan; clear liquid diet today Transfuse 3 units of packed RBCs as ordered IV PPI twice daily Hold Plavix Continue to trend hemoglobin every 6 hours and transfuse for hemoglobin 7 or less She will need EGD/enteroscopy, would like to allow Plavix to washout a bit prior to procedure but we will monitor her closely and decide if procedure needs to be done in the next 24 hours.  Will hold her n.p.o. in a.m. GI will follow with you      Amy EsterwoodPA-C  06/19/2023, 8:54 AM     Attending physician's note   I have taken history, reviewed the chart and examined the patient. I performed a substantive portion of this encounter, including complete performance of at least one of the key components, in conjunction with the APP. I agree with the Advanced Practitioner's note, impression and recommendations.   Melena on Plavix (last dose 1/19) with adm Hb 5 (baseline 10 - Jun 07, 2023) s/p 3U. HD stable. Prev EGD Nov 2021 with PUD/Duodenal AVM s/p APC. Neg colon 2021. No NSAIDs. Neg CT AP with contrast on  adm.  Comorbidities include diabetic nephropathy (CKD3), anemia of chronic disease, HTN, history of CVA on plavix  Plan: -Push enteroscopy in AM for expedited workup -Trend CBC -IV Protonix -Hold Plavix -OK for clear liquid diet. NPO past MN -If above is neg, consider VCE   Edman Circle, MD [facility] [phone]

## 2023-06-20 ENCOUNTER — Ambulatory Visit (INDEPENDENT_AMBULATORY_CARE_PROVIDER_SITE_OTHER): Payer: 59 | Admitting: Audiology

## 2023-06-20 ENCOUNTER — Inpatient Hospital Stay (HOSPITAL_COMMUNITY): Payer: 59

## 2023-06-20 ENCOUNTER — Ambulatory Visit (INDEPENDENT_AMBULATORY_CARE_PROVIDER_SITE_OTHER): Payer: 59

## 2023-06-20 ENCOUNTER — Encounter (HOSPITAL_COMMUNITY): Payer: Self-pay | Admitting: Family Medicine

## 2023-06-20 ENCOUNTER — Encounter (HOSPITAL_COMMUNITY)
Admission: EM | Disposition: A | Discharge status: Home Health | Payer: Self-pay | Source: Home / Self Care | Attending: Internal Medicine

## 2023-06-20 DIAGNOSIS — D649 Anemia, unspecified: Secondary | ICD-10-CM | POA: Diagnosis not present

## 2023-06-20 DIAGNOSIS — K922 Gastrointestinal hemorrhage, unspecified: Secondary | ICD-10-CM | POA: Diagnosis not present

## 2023-06-20 DIAGNOSIS — K3182 Dieulafoy lesion (hemorrhagic) of stomach and duodenum: Secondary | ICD-10-CM

## 2023-06-20 DIAGNOSIS — K449 Diaphragmatic hernia without obstruction or gangrene: Secondary | ICD-10-CM | POA: Diagnosis not present

## 2023-06-20 DIAGNOSIS — I129 Hypertensive chronic kidney disease with stage 1 through stage 4 chronic kidney disease, or unspecified chronic kidney disease: Secondary | ICD-10-CM | POA: Diagnosis not present

## 2023-06-20 DIAGNOSIS — K222 Esophageal obstruction: Secondary | ICD-10-CM | POA: Diagnosis not present

## 2023-06-20 DIAGNOSIS — E119 Type 2 diabetes mellitus without complications: Secondary | ICD-10-CM

## 2023-06-20 DIAGNOSIS — N1832 Chronic kidney disease, stage 3b: Secondary | ICD-10-CM | POA: Diagnosis not present

## 2023-06-20 HISTORY — PX: HEMOSTASIS CONTROL: SHX6838

## 2023-06-20 HISTORY — PX: ENTEROSCOPY: SHX5533

## 2023-06-20 HISTORY — PX: HEMOSTASIS CLIP PLACEMENT: SHX6857

## 2023-06-20 LAB — CBC
HCT: 25.1 % — ABNORMAL LOW (ref 36.0–46.0)
HCT: 26.9 % — ABNORMAL LOW (ref 36.0–46.0)
Hemoglobin: 8.5 g/dL — ABNORMAL LOW (ref 12.0–15.0)
Hemoglobin: 8.9 g/dL — ABNORMAL LOW (ref 12.0–15.0)
MCH: 28.7 pg (ref 26.0–34.0)
MCH: 28.2 pg (ref 26.0–34.0)
MCHC: 33.9 g/dL (ref 30.0–36.0)
MCHC: 33.1 g/dL (ref 30.0–36.0)
MCV: 84.8 fL (ref 80.0–100.0)
MCV: 85.1 fL (ref 80.0–100.0)
Platelets: 266 10*3/uL (ref 150–400)
Platelets: 285 10*3/uL (ref 150–400)
RBC: 2.96 MIL/uL — ABNORMAL LOW (ref 3.87–5.11)
RBC: 3.16 MIL/uL — ABNORMAL LOW (ref 3.87–5.11)
RDW: 14.8 % (ref 11.5–15.5)
RDW: 15 % (ref 11.5–15.5)
WBC: 10.9 10*3/uL — ABNORMAL HIGH (ref 4.0–10.5)
WBC: 10.1 10*3/uL (ref 4.0–10.5)
nRBC: 0.4 % — ABNORMAL HIGH (ref 0.0–0.2)
nRBC: 0.2 % (ref 0.0–0.2)

## 2023-06-20 LAB — TYPE AND SCREEN
ABO/RH(D): B POS
Antibody Screen: NEGATIVE
Unit division: 0
Unit division: 0
Unit division: 0

## 2023-06-20 LAB — BASIC METABOLIC PANEL
Anion gap: 9 (ref 5–15)
Anion gap: 9 (ref 5–15)
BUN: 29 mg/dL — ABNORMAL HIGH (ref 8–23)
BUN: 25 mg/dL — ABNORMAL HIGH (ref 8–23)
CO2: 23 mmol/L (ref 22–32)
CO2: 23 mmol/L (ref 22–32)
Calcium: 8.5 mg/dL — ABNORMAL LOW (ref 8.9–10.3)
Calcium: 8.8 mg/dL — ABNORMAL LOW (ref 8.9–10.3)
Chloride: 104 mmol/L (ref 98–111)
Chloride: 105 mmol/L (ref 98–111)
Creatinine, Ser: 1.17 mg/dL — ABNORMAL HIGH (ref 0.44–1.00)
Creatinine, Ser: 1.09 mg/dL — ABNORMAL HIGH (ref 0.44–1.00)
GFR, Estimated: 48 mL/min — ABNORMAL LOW (ref >60)
GFR, Estimated: 53 mL/min — ABNORMAL LOW (ref >60)
Glucose, Bld: 181 mg/dL — ABNORMAL HIGH (ref 70–99)
Glucose, Bld: 246 mg/dL — ABNORMAL HIGH (ref 70–99)
Potassium: 3.9 mmol/L (ref 3.5–5.1)
Potassium: 3.6 mmol/L (ref 3.5–5.1)
Sodium: 136 mmol/L (ref 135–145)
Sodium: 137 mmol/L (ref 135–145)

## 2023-06-20 LAB — BPAM RBC
Blood Product Expiration Date: 202501312359
Blood Product Expiration Date: 202502012359
Blood Product Expiration Date: 202502032359
ISSUE DATE / TIME: 202501200317
ISSUE DATE / TIME: 202501200802
ISSUE DATE / TIME: 202501201121
Unit Type and Rh: 7300
Unit Type and Rh: 7300
Unit Type and Rh: 7300

## 2023-06-20 LAB — GLUCOSE, CAPILLARY
Glucose-Capillary: 180 mg/dL — ABNORMAL HIGH (ref 70–99)
Glucose-Capillary: 179 mg/dL — ABNORMAL HIGH (ref 70–99)
Glucose-Capillary: 209 mg/dL — ABNORMAL HIGH (ref 70–99)
Glucose-Capillary: 254 mg/dL — ABNORMAL HIGH (ref 70–99)
Glucose-Capillary: 412 mg/dL — ABNORMAL HIGH (ref 70–99)

## 2023-06-20 SURGERY — ENTEROSCOPY
Anesthesia: Monitor Anesthesia Care

## 2023-06-20 MED ORDER — LIDOCAINE 2% (20 MG/ML) 5 ML SYRINGE
INTRAMUSCULAR | Status: DC | PRN
  Administered 2023-06-20: 100 mg via INTRAVENOUS

## 2023-06-20 MED ORDER — GLUCAGON HCL RDNA (DIAGNOSTIC) 1 MG IJ SOLR
INTRAMUSCULAR | Status: AC
  Filled 2023-06-20: qty 1

## 2023-06-20 MED ORDER — PROPOFOL 10 MG/ML IV BOLUS
INTRAVENOUS | Status: DC | PRN
  Administered 2023-06-20: 50 mg via INTRAVENOUS
  Administered 2023-06-20: 20 mg via INTRAVENOUS
  Administered 2023-06-20: 50 mg via INTRAVENOUS

## 2023-06-20 MED ORDER — VASOPRESSIN 20 UNIT/ML IV SOLN
INTRAVENOUS | Status: DC | PRN
  Administered 2023-06-20: 1 [IU] via INTRAVENOUS
  Administered 2023-06-20: 2 [IU] via INTRAVENOUS

## 2023-06-20 MED ORDER — INSULIN ASPART 100 UNIT/ML IJ SOLN
10.0000 [IU] | Freq: Once | INTRAMUSCULAR | Status: AC
  Administered 2023-06-20: 10 [IU] via SUBCUTANEOUS

## 2023-06-20 MED ORDER — PHENYLEPHRINE 80 MCG/ML (10ML) SYRINGE FOR IV PUSH (FOR BLOOD PRESSURE SUPPORT)
PREFILLED_SYRINGE | INTRAVENOUS | Status: DC | PRN
  Administered 2023-06-20: 80 ug via INTRAVENOUS
  Administered 2023-06-20: 120 ug via INTRAVENOUS

## 2023-06-20 MED ORDER — INSULIN ASPART 100 UNIT/ML IJ SOLN
0.0000 [IU] | INTRAMUSCULAR | Status: DC
Start: 2023-06-20 — End: 2023-06-23
  Administered 2023-06-20: 3 [IU] via SUBCUTANEOUS
  Administered 2023-06-21: 2 [IU] via SUBCUTANEOUS
  Administered 2023-06-21: 3 [IU] via SUBCUTANEOUS
  Administered 2023-06-21: 5 [IU] via SUBCUTANEOUS
  Administered 2023-06-21 – 2023-06-22 (×2): 1 [IU] via SUBCUTANEOUS
  Administered 2023-06-22 (×2): 2 [IU] via SUBCUTANEOUS
  Administered 2023-06-22 (×2): 3 [IU] via SUBCUTANEOUS
  Administered 2023-06-23 (×2): 2 [IU] via SUBCUTANEOUS
  Administered 2023-06-23: 1 [IU] via SUBCUTANEOUS

## 2023-06-20 MED ORDER — PROPOFOL 500 MG/50ML IV EMUL
INTRAVENOUS | Status: DC | PRN
  Administered 2023-06-20: 110 ug/kg/min via INTRAVENOUS
  Administered 2023-06-20: 125 ug/kg/min via INTRAVENOUS

## 2023-06-20 MED ORDER — PHENYLEPHRINE HCL-NACL 20-0.9 MG/250ML-% IV SOLN
INTRAVENOUS | Status: DC | PRN
  Administered 2023-06-20: 25 ug/min via INTRAVENOUS

## 2023-06-20 MED ORDER — SODIUM CHLORIDE 0.9 % IV SOLN: INTRAVENOUS | Status: DC | PRN

## 2023-06-20 NOTE — Transfer of Care (Signed)
Immediate Anesthesia Transfer of Care Note  Patient: Tara Conner  Procedure(s) Performed: ENTEROSCOPY HEMOSTASIS CLIP PLACEMENT HEMOSTASIS CONTROL  Patient Location: PACU and Endoscopy Unit  Anesthesia Type:MAC  Level of Consciousness: sedated and patient cooperative  Airway & Oxygen Therapy: Patient Spontanous Breathing and Patient connected to nasal cannula oxygen  Post-op Assessment: Report given to RN and Post -op Vital signs reviewed and stable  Post vital signs: Reviewed and stable  Last Vitals:  Vitals Value Taken Time  BP 110/59 06/20/23 1026  Temp    Pulse 72 06/20/23 1027  Resp 19 06/20/23 1027  SpO2 98 % 06/20/23 1027  Vitals shown include unfiled device data.  Last Pain:  Vitals:   06/20/23 0913  TempSrc:   PainSc: 0-No pain         Complications: No notable events documented.

## 2023-06-20 NOTE — Op Note (Signed)
[facility] Patient Name: Tara Conner Procedure Date : 06/20/2023 MRN: [id number] Attending MD: Lynann Bologna , MD, [id number] Date of Birth: [birth year] CSN: [id number] Age: 78 Admit Type: Inpatient Procedure:                Small bowel enteroscopy changed to EGD with                            therapeutic scope Indications:              Melena on Plavix (last dose 1/19) with adm Hb 5                            (baseline 10 - Jun 07, 2023) s/p 3U. HD stable. Neg                            colon 2021. No NSAIDs. Neg CT AP with contrast. Providers:                Lynann Bologna, MD, Fransisca Connors, Beryle Beams,                            Janace Aris CRNA Referring MD:              Medicines:                Monitored Anesthesia Care Complications:            No immediate complications. Estimated Blood Loss:     Estimated blood loss was minimal. Procedure:                Pre-Anesthesia Assessment:                           - Prior to the procedure, a History and Physical                            was performed, and patient medications and                            allergies were reviewed. The patient's tolerance of                            previous anesthesia was also reviewed. The risks                            and benefits of the procedure and the sedation                            options and risks were discussed with the patient.                            All questions were answered, and informed consent                            was obtained. Prior Anticoagulants: The patient has  taken Plavix (clopidogrel), last dose was 1 day                            prior to procedure. ASA Grade Assessment: III - A                            patient with severe systemic disease. After                            reviewing the risks and benefits, the patient was                            deemed in satisfactory condition to undergo  the                            procedure.                           After obtaining informed consent, the endoscope was                            passed under direct vision. Throughout the                            procedure, the patient's blood pressure, pulse, and                            oxygen saturations were monitored continuously. The                            PCF-H190TL ([id number]) Olympus slim colonoscope was                            introduced through the mouth and advanced to the                            second part of duodenum. We switched to therapeutic                            EGD scope since there were clots in the stomach.                            The patient tolerated the procedure well. Scope In: Scope Out: Findings:      The examined esophagus was normal with wide open Schatzki's ring at GE       junction.      A small hiatal hernia was present.      Clots and fresh blood was found in the gastric fundus and in the gastric       body. The clots were removed with therapeutic endoscope.      A bleeding Dieulafoy lesion with oozing and was found in the proximal       gastric body. For hemostasis, three hemostatic clips were successfully       placed (MR conditional). Clip manufacturer: [employer]. There was  no bleeding at the end of the procedure. Since patient was on Plavix       until recently, 3 mL of purastat was applied at the clipping site.      The examined duodenum was normal. Impression:               - Actively bleeding Dieulafoy lesion of stomach s/p                            clipping/purastat                           - Small hiatal hernia.                           - Otherwise normal EGD.                           - No specimens collected. Recommendation:           - Return patient to hospital ward for ongoing care.                           - Clear liquid diet. Do not advance until tomorrow                            to full  liquid.                           - Continue IV Protonix 40 BID. Once able to                            tolerate p.o., switch to Protonix40 BID x 4 weeks,                            then QD                           - If needed, can restart Plavix in 5 days                           - Trend CBC. Keep Hb>7                           - The findings and recommendations were discussed                            with the patient's family.                           - The findings and recommendations were discussed                            with the referring physician. Procedure Code(s):        --- Professional ---  43255, Esophagogastroduodenoscopy, flexible,                            transoral; with control of bleeding, any method Diagnosis Code(s):        --- Professional ---                           K44.9, Diaphragmatic hernia without obstruction or                            gangrene                           K92.2, Gastrointestinal hemorrhage, unspecified                           K31.82, Dieulafoy lesion (hemorrhagic) of stomach                            and duodenum                           K92.1, Melena (includes Hematochezia) CPT copyright 2022 American Medical Association. All rights reserved. The codes documented in this report are preliminary and upon coder review may  be revised to meet current compliance requirements. Lynann Bologna, MD 06/20/2023 10:40:58 AM This report has been signed electronically. Number of Addenda: 0

## 2023-06-20 NOTE — Plan of Care (Signed)
  Problem: Coping: Goal: Ability to adjust to condition or change in health will improve Outcome: Progressing   Problem: Health Behavior/Discharge Planning: Goal: Ability to manage health-related needs will improve Outcome: Progressing   Problem: Clinical Measurements: Goal: Will remain free from infection Outcome: Progressing   Problem: Activity: Goal: Risk for activity intolerance will decrease Outcome: Progressing   Problem: Safety: Goal: Ability to remain free from injury will improve Outcome: Progressing

## 2023-06-20 NOTE — Progress Notes (Signed)
PROGRESS NOTE                                                                                                                                                                                                             Patient Demographics:    Tara Conner, is a 78 y.o. female, DOB - [birth year], ZOX:[id number]  Outpatient Primary MD for the patient is [name], [name], NP    LOS - 1  Admit date - 06/18/2023    Chief Complaint  Patient presents with   Dizziness       Brief Narrative (HPI from H&P)    78 y.o. female with medical history significant for hypertension, insulin-dependent diabetes mellitus, history of CVA, and CKD 3B who presents with multiple complaints including lightheadedness and fatigue, and dark stools, diagnosed with upper GI bleed with anemia requiring 3 units of packed RBC transfusion.  Admitted to the hospital for further care.    Subjective:    Tara Conner today has, No headache, No chest pain, No abdominal pain - No Nausea, No new weakness tingling or numbness, no cough or shortness of breath   Assessment  & Plan :   1. Upper GI bleeding; symptomatic anemia due to upper GI blood loss.  Presents with lightheadedness and fatigue, Hgb is 5.2 (10.0 on January 8th), and BUN is 45 (25 on January 8th), was on Plavix s/p 3 units of packed RBC transfusion, H&H currently stable, on IV PPI, Plavix on hold.  GI following undergoing endoscopic evaluation on 06/20/2023.  Continue to monitor CBC, advance activity, PT OT.   2. Hx of CVA - Hx of ischemic right pontine CVA in 2019, Hold Plavix     3. Type II DM  - A1c was 10.0% in January 2025  - Check CBGs, continue long-acting insulin with dose-reduction while NPO, use low-intensity SSI   CBG (last 3)  Recent Labs    06/19/23 2337 06/20/23 0314 06/20/23 0755  GLUCAP 192* 180* 179*     4. CKD 3A - Appears close to baseline    5. Hypertension -  Treat as-needed only for now     6. Elevated troponin - Troponin only mildly elevated and flat; there is no chest discomfort, due to demand ischemia caused by extreme anemia.  No further workup.  Chest pain-free.  Condition - Fair  Family Communication  : Daughter bedside on 06/20/2023  Code Status :  Full  Consults  :  GI  PUD Prophylaxis : PPI   Procedures  :           Disposition Plan  :    Status is: Inpatient   DVT Prophylaxis  :    SCDs Start: 06/19/23 0409   Lab Results  Component Value Date   PLT 266 06/20/2023    Diet :  Diet Order             Diet clear liquid Room service appropriate? Yes; Fluid consistency: Thin  Diet effective now                    Inpatient Medications  Scheduled Meds:  [MAR Hold] insulin aspart  0-6 Units Subcutaneous Q4H   [MAR Hold] insulin glargine-yfgn  10 Units Subcutaneous Daily   [MAR Hold] pantoprazole (PROTONIX) IV  40 mg Intravenous Q12H   [MAR Hold] sodium chloride flush  3 mL Intravenous Q12H   Continuous Infusions: PRN Meds:.[MAR Hold] acetaminophen **OR** [MAR Hold] acetaminophen, [MAR Hold] labetalol, [MAR Hold] prochlorperazine  Antibiotics  :    Anti-infectives (From admission, onward)    None         Objective:   Vitals:   06/20/23 1027 06/20/23 1028 06/20/23 1030 06/20/23 1035  BP: (!) 110/59  (!) 105/51   Pulse: 73 72 72 84  Resp: (!) 22 19 19 12   Temp: (!) 97 F (36.1 C)     TempSrc: Temporal     SpO2: 97% 98% 97% 99%  Weight:      Height:        Wt Readings from Last 3 Encounters:  06/18/23 93.9 kg  06/14/23 94.3 kg  05/16/23 94.4 kg     Intake/Output Summary (Last 24 hours) at 06/20/2023 1044 Last data filed at 06/20/2023 1023 Gross per 24 hour  Intake 428.25 ml  Output --  Net 428.25 ml     Physical Exam  Awake Alert, No new F.N deficits, Normal affect Jordan.AT,PERRAL Supple Neck, No JVD,   Symmetrical Chest wall movement, Good air movement bilaterally,  CTAB RRR,No Gallops,Rubs or new Murmurs,  +ve B.Sounds, Abd Soft, No tenderness,   No Cyanosis, Clubbing or edema      Data Review:    Recent Labs  Lab 06/18/23 2343 06/19/23 0159 06/19/23 1741 06/19/23 2208 06/20/23 0442  WBC 11.8*  --  11.6* 11.9* 10.9*  HGB 5.2* 4.8* 9.6* 9.5* 8.5*  HCT 16.3* 15.3* 28.7* 28.0* 25.1*  PLT 323  --  278 279 266  MCV 88.1  --  85.4 83.8 84.8  MCH 28.1  --  28.6 28.4 28.7  MCHC 31.9  --  33.4 33.9 33.9  RDW 14.0  --  14.5 14.7 14.8  LYMPHSABS 3.1  --   --   --   --   MONOABS 1.0  --   --   --   --   EOSABS 0.2  --   --   --   --   BASOSABS 0.1  --   --   --   --     Recent Labs  Lab 06/18/23 2343 06/19/23 1741 06/20/23 0442  NA 137 136 136  K 3.9 4.0 3.9  CL 101 104 104  CO2 23 22 23   ANIONGAP 13 10 9   GLUCOSE 208* 217* 181*  BUN 45* 35* 29*  CREATININE  1.27* 1.08* 1.17*  AST 20  --   --   ALT 16  --   --   ALKPHOS 54  --   --   BILITOT 0.4  --   --   ALBUMIN 3.0*  --   --   MG  --  1.8  --   CALCIUM 8.7* 8.7* 8.5*      Recent Labs  Lab 06/18/23 2343 06/19/23 1741 06/20/23 0442  MG  --  1.8  --   CALCIUM 8.7* 8.7* 8.5*    --------------------------------------------------------------------------------------------------------------- Lab Results  Component Value Date   CHOL 135 06/07/2023   HDL 45 (L) 06/07/2023   LDLCALC 72 06/07/2023   LDLDIRECT 54.0 08/10/2021   TRIG 101 06/07/2023   CHOLHDL 3.0 06/07/2023    Lab Results  Component Value Date   HGBA1C 10.1 (H) 06/07/2023   No results for input(s): "TSH", "T4TOTAL", "FREET4", "T3FREE", "THYROIDAB" in the last 72 hours. Recent Labs    06/19/23 0253  VITAMINB12 780  FOLATE 20.1  FERRITIN 16  TIBC 342  IRON 109  RETICCTPCT 4.4*   ------------------------------------------------------------------------------------------------------------------ Cardiac Enzymes No results for input(s): "CKMB", "TROPONINI", "MYOGLOBIN" in the last 168 hours.  Invalid  input(s): "CK"  Micro Results No results found for this or any previous visit (from the past 240 hours).  Radiology Reports CT ABDOMEN PELVIS W CONTRAST Result Date: 06/19/2023 CLINICAL DATA:  Upper GI bleed, melanotic stool, anemic EXAM: CT ABDOMEN AND PELVIS WITH CONTRAST TECHNIQUE: Multidetector CT imaging of the abdomen and pelvis was performed using the standard protocol following bolus administration of intravenous contrast. RADIATION DOSE REDUCTION: This exam was performed according to the departmental dose-optimization program which includes automated exposure control, adjustment of the mA and/or kV according to patient size and/or use of iterative reconstruction technique. CONTRAST:  75mL OMNIPAQUE IOHEXOL 350 MG/ML SOLN COMPARISON:  None Available. FINDINGS: Lower chest: No acute abnormality. Hepatobiliary: Unremarkable liver. Cholecystectomy. No biliary dilation. Pancreas: Unremarkable. Spleen: Unremarkable. Adrenals/Urinary Tract: Normal adrenal glands. Cortical renal scarring upper pole of the left kidney. No urinary calculi or hydronephrosis. Bladder is unremarkable. Stomach/Bowel: Normal caliber large and small bowel. No bowel wall thickening. The appendix is normal.Stomach is within normal limits. Vascular/Lymphatic: Aortic atherosclerosis. No enlarged abdominal or pelvic lymph nodes. Reproductive: Unremarkable. Other: No free intraperitoneal fluid or air. Musculoskeletal: No acute fracture. IMPRESSION: 1. No acute abnormality in the abdomen or pelvis. Aortic Atherosclerosis (ICD10-I70.0). Electronically Signed   By: Minerva Fester M.D.   On: 06/19/2023 03:37      Signature  -   Susa Raring M.D on 06/20/2023 at 10:44 AM   -  To page go to [email or web address]

## 2023-06-20 NOTE — Evaluation (Signed)
Physical Therapy Evaluation Patient Details Name: Tara Conner MRN: [id number] DOB: 1946-04-26 Today's Date: 06/20/2023  History of Present Illness  Pt is a 78 y/o F admitted on 06/18/23 after presenting with c/o lightheadedness, fatigue, dark stools. Pt was diagnosed with upper GI bleed with anemia. PMH: HTN, IDDM, CVA, CKD 3B  Clinical Impression  Pt seen for PT evaluation with pt agreeable to tx, daughter present but exiting session halfway through. Pt reports prior to admission she was ambulatory with [facility], denies falls, lives alone in a 2 level apartment with flight of stairs to access bed/bathroom. On this date, pt is able to ambulate in room without AD with supervision, multiple laps in room with RW & mod I. Pt does c/o dizziness x 1 upon standing but VSS. Pt would benefit from acute PT services to address gait with LRAD & stair negotiation.    If plan is discharge home, recommend the following: A little help with walking and/or transfers;A little help with bathing/dressing/bathroom;Assistance with cooking/housework;Help with stairs or ramp for entrance;Assist for transportation   Can travel by private vehicle        Equipment Recommendations None recommended by PT  Recommendations for Other Services       Functional Status Assessment Patient has had a recent decline in their functional status and demonstrates the ability to make significant improvements in function in a reasonable and predictable amount of time.     Precautions / Restrictions Precautions Precautions: Fall Restrictions Weight Bearing Restrictions Per Provider Order: No      Mobility  Bed Mobility               General bed mobility comments: not tested, pt received in bathroom, left sitting in recliner    Transfers Overall transfer level: Independent Equipment used: None               General transfer comment: STS without AD    Ambulation/Gait Ambulation/Gait assistance: Supervision,  Modified independent (Device/Increase time) Gait Distance (Feet): 15 Feet (+ 75 ft) Assistive device: None, Rolling walker (2 wheels) Gait Pattern/deviations: Decreased stride length Gait velocity: slightly decreased     General Gait Details: Pt received in bathroom, ambulates out of bathroom in room without AD with supervision with decreased step length, decreased stride length, slightly decreased heel strike, guarded gait. Pt ambulates laps in room with RW & mod I.  Stairs            Wheelchair Mobility     Tilt Bed    Modified Rankin (Stroke Patients Only)       Balance Overall balance assessment: Needs assistance   Sitting balance-Leahy Scale: Good     Standing balance support: No upper extremity supported, During functional activity Standing balance-Leahy Scale: Fair                               Pertinent Vitals/Pain Pain Assessment Pain Assessment: No/denies pain    Home Living Family/patient expects to be discharged to:: Private residence Living Arrangements: Alone Available Help at Discharge: Family;Available PRN/intermittently Type of Home: Apartment Home Access: Stairs to enter Entrance Stairs-Rails: None Entrance Stairs-Number of Steps: 2 Alternate Level Stairs-Number of Steps: flight Home Layout: Two level;Bed/bath upstairs Home Equipment: Rolling Walker (2 wheels);Cane - single point;Shower seat      Prior Function Prior Level of Function : Independent/Modified Independent             Mobility Comments: Mod  I with SPC, denies falls, no longer drives.       Extremity/Trunk Assessment   Upper Extremity Assessment Upper Extremity Assessment: Overall WFL for tasks assessed    Lower Extremity Assessment Lower Extremity Assessment: Generalized weakness;Overall WFL for tasks assessed       Communication   Communication Communication: No apparent difficulties  Cognition Arousal: Alert Behavior During Therapy: WFL for  tasks assessed/performed Overall Cognitive Status: Within Functional Limits for tasks assessed                                 General Comments: very pleasant, pt with good insight/awareness & notes when she needs to sit down 2/2 feeling dizzy upon standing.        General Comments General comments (skin integrity, edema, etc.): VSS despite c/o dizziness x1 upon standing    Exercises     Assessment/Plan    PT Assessment Patient needs continued PT services  PT Problem List Decreased strength;Pain;Decreased activity tolerance;Decreased balance;Decreased mobility;Decreased knowledge of use of DME;Decreased safety awareness       PT Treatment Interventions DME instruction;Balance training;Modalities;Neuromuscular re-education;Stair training;Gait training;Functional mobility training;Therapeutic exercise;Manual techniques;Therapeutic activities;Patient/family education    PT Goals (Current goals can be found in the Care Plan section)  Acute Rehab PT Goals Patient Stated Goal: get better PT Goal Formulation: With patient Time For Goal Achievement: 07/04/23 Potential to Achieve Goals: Good    Frequency Min 1X/week     Co-evaluation               AM-PAC PT "6 Clicks" Mobility  Outcome Measure Help needed turning from your back to your side while in a flat bed without using bedrails?: None Help needed moving from lying on your back to sitting on the side of a flat bed without using bedrails?: None Help needed moving to and from a bed to a chair (including a wheelchair)?: None Help needed standing up from a chair using your arms (e.g., wheelchair or bedside chair)?: None Help needed to walk in hospital room?: None Help needed climbing 3-5 steps with a railing? : A Little 6 Click Score: 23    End of Session   Activity Tolerance: Patient tolerated treatment well Patient left: in chair;with chair alarm set;with call bell/phone within reach   PT Visit  Diagnosis: Muscle weakness (generalized) (M62.81);Unsteadiness on feet (R26.81)    Time: 5427-0623 PT Time Calculation (min) (ACUTE ONLY): 19 min   Charges:   PT Evaluation $PT Eval Low Complexity: 1 Low   PT General Charges $$ ACUTE PT VISIT: 1 Visit         Aleda Grana, PT, DPT 06/20/23, 2:27 PM   Sandi Mariscal 06/20/2023, 2:25 PM

## 2023-06-20 NOTE — Anesthesia Preprocedure Evaluation (Signed)
Anesthesia Evaluation  Patient identified by MRN, date of birth, ID band Patient awake    Reviewed: Allergy & Precautions, NPO status , Patient's Chart, lab work & pertinent test results  Airway Mallampati: III  TM Distance: >3 FB Neck ROM: Full    Dental   Pulmonary neg pulmonary ROS   Pulmonary exam normal        Cardiovascular hypertension, Pt. on medications Normal cardiovascular exam Rhythm:Regular Rate:Normal     Neuro/Psych TIACVA    GI/Hepatic Neg liver ROS,GERD  ,,  Endo/Other  diabetes, Type 2    Renal/GU Renal InsufficiencyRenal disease     Musculoskeletal   Abdominal   Peds  Hematology  (+) Blood dyscrasia, anemia   Anesthesia Other Findings   Reproductive/Obstetrics                             Anesthesia Physical Anesthesia Plan  ASA: 3  Anesthesia Plan: MAC   Post-op Pain Management:    Induction:   PONV Risk Score and Plan: 2 and Propofol infusion and Ondansetron  Airway Management Planned: Natural Airway and Nasal Cannula  Additional Equipment:   Intra-op Plan:   Post-operative Plan:   Informed Consent: I have reviewed the patients History and Physical, chart, labs and discussed the procedure including the risks, benefits and alternatives for the proposed anesthesia with the patient or authorized representative who has indicated his/her understanding and acceptance.       Plan Discussed with: CRNA  Anesthesia Plan Comments:        Anesthesia Quick Evaluation

## 2023-06-20 NOTE — Anesthesia Procedure Notes (Signed)
Procedure Name: MAC Date/Time: 06/20/2023 9:40 AM  Performed by: Nadara Mustard, CRNAPre-anesthesia Checklist: Patient identified, Emergency Drugs available, Suction available, Timeout performed and Patient being monitored Patient Re-evaluated:Patient Re-evaluated prior to induction Oxygen Delivery Method: Nasal cannula Comments: HFNC

## 2023-06-21 DIAGNOSIS — K922 Gastrointestinal hemorrhage, unspecified: Secondary | ICD-10-CM | POA: Diagnosis not present

## 2023-06-21 DIAGNOSIS — K3182 Dieulafoy lesion (hemorrhagic) of stomach and duodenum: Secondary | ICD-10-CM | POA: Diagnosis not present

## 2023-06-21 DIAGNOSIS — D649 Anemia, unspecified: Secondary | ICD-10-CM | POA: Diagnosis not present

## 2023-06-21 LAB — CBC WITH DIFFERENTIAL/PLATELET
Abs Immature Granulocytes: 0.07 10*3/uL (ref 0.00–0.07)
Basophils Absolute: 0.1 10*3/uL (ref 0.0–0.1)
Basophils Relative: 1 %
Eosinophils Absolute: 0.1 10*3/uL (ref 0.0–0.5)
Eosinophils Relative: 1 %
HCT: 28.5 % — ABNORMAL LOW (ref 36.0–46.0)
Hemoglobin: 9.5 g/dL — ABNORMAL LOW (ref 12.0–15.0)
Immature Granulocytes: 1 %
Lymphocytes Relative: 15 %
Lymphs Abs: 1.7 10*3/uL (ref 0.7–4.0)
MCH: 28.8 pg (ref 26.0–34.0)
MCHC: 33.3 g/dL (ref 30.0–36.0)
MCV: 86.4 fL (ref 80.0–100.0)
Monocytes Absolute: 1.1 10*3/uL — ABNORMAL HIGH (ref 0.1–1.0)
Monocytes Relative: 9 %
Neutro Abs: 8.5 10*3/uL — ABNORMAL HIGH (ref 1.7–7.7)
Neutrophils Relative %: 73 %
Platelets: 303 10*3/uL (ref 150–400)
RBC: 3.3 MIL/uL — ABNORMAL LOW (ref 3.87–5.11)
RDW: 15.3 % (ref 11.5–15.5)
WBC: 11.5 10*3/uL — ABNORMAL HIGH (ref 4.0–10.5)
nRBC: 0 % (ref 0.0–0.2)

## 2023-06-21 LAB — GLUCOSE, CAPILLARY
Glucose-Capillary: 62 mg/dL — ABNORMAL LOW (ref 70–99)
Glucose-Capillary: 170 mg/dL — ABNORMAL HIGH (ref 70–99)
Glucose-Capillary: 172 mg/dL — ABNORMAL HIGH (ref 70–99)
Glucose-Capillary: 121 mg/dL — ABNORMAL HIGH (ref 70–99)
Glucose-Capillary: 256 mg/dL — ABNORMAL HIGH (ref 70–99)
Glucose-Capillary: 243 mg/dL — ABNORMAL HIGH (ref 70–99)

## 2023-06-21 LAB — BRAIN NATRIURETIC PEPTIDE: B Natriuretic Peptide: 14.2 pg/mL (ref 0.0–100.0)

## 2023-06-21 LAB — BASIC METABOLIC PANEL
Anion gap: 9 (ref 5–15)
BUN: 24 mg/dL — ABNORMAL HIGH (ref 8–23)
CO2: 23 mmol/L (ref 22–32)
Calcium: 8.9 mg/dL (ref 8.9–10.3)
Chloride: 108 mmol/L (ref 98–111)
Creatinine, Ser: 1.14 mg/dL — ABNORMAL HIGH (ref 0.44–1.00)
GFR, Estimated: 50 mL/min — ABNORMAL LOW (ref >60)
Glucose, Bld: 107 mg/dL — ABNORMAL HIGH (ref 70–99)
Potassium: 3.6 mmol/L (ref 3.5–5.1)
Sodium: 140 mmol/L (ref 135–145)

## 2023-06-21 LAB — PHOSPHORUS: Phosphorus: 4.6 mg/dL (ref 2.5–4.6)

## 2023-06-21 LAB — MAGNESIUM: Magnesium: 1.7 mg/dL (ref 1.7–2.4)

## 2023-06-21 MED ORDER — INSULIN GLARGINE-YFGN 100 UNIT/ML ~~LOC~~ SOLN
12.0000 [IU] | Freq: Every day | SUBCUTANEOUS | Status: DC
  Administered 2023-06-22: 12 [IU] via SUBCUTANEOUS
  Filled 2023-06-21 (×2): qty 0.12

## 2023-06-21 NOTE — Progress Notes (Signed)
PROGRESS NOTE                                                                                                                                                                                                             Patient Demographics:    Tara Conner, is a 78 y.o. female, DOB - [birth year], ZOX:[id number]  Outpatient Primary MD for the patient is [name], [name], NP    LOS - 2  Admit date - 06/18/2023    Chief Complaint  Patient presents with   Dizziness       Brief Narrative (HPI from H&P)    77 y.o. female with medical history significant for hypertension, insulin-dependent diabetes mellitus, history of CVA, and CKD 3B who presents with multiple complaints including lightheadedness and fatigue, and dark stools, diagnosed with upper GI bleed with anemia requiring 3 units of packed RBC transfusion.  Admitted to the hospital for further care.    Subjective:    Tara Conner no significant events overnight, she denies any complaints this morning, asking if we can advance her diet.     Assessment  & Plan :   Upper GI bleeding symptomatic anemia/acute blood loss anemia due to upper GI blood loss.   Dieulafoy lesion s/p endoscopic therapy  - Presents with lightheadedness and fatigue, Hgb is 5.2 (10.0 on January 8th), and BUN is 45 (25 on January 8th). -s/p 3 units of packed RBC transfusion, H -Input greatly appreciated, status post endoscopy 1/21, significant for Dirulafoy lesion with visible vessel s/p endoscopic therapy -Continue to hold Plavix, may resume 1/25 -Soft diet, repeat CBC in a.m. -Protonix 40 mg p.o. twice daily x 4 weeks then once daily   Hx of CVA - Hx of ischemic right pontine CVA in 2019, Plavix on hold due to above, resume 1/25   3. Type II DM  - A1c was 10.0% in January 2025  - Check CBGs, continue long-acting insulin with dose-reduction while NPO, use low-intensity SSI, CBG is  elevated today, will increase Semglee to 12 units.    4. CKD 3A - Appears close to baseline    5. Hypertension - Treat as-needed only for now     6. Elevated troponin - Troponin only mildly elevated and flat; there is no chest discomfort, due to demand ischemia caused by extreme anemia.  No further  workup.  Chest pain-free.       Condition - Fair  Family Communication  : None at bedside  Code Status :  Full  Consults  :  GI  PUD Prophylaxis : PPI   Procedures  :           Disposition Plan  :    Status is: Inpatient   DVT Prophylaxis  :    SCDs Start: 06/19/23 0409   Lab Results  Component Value Date   PLT 303 06/21/2023    Diet :  Diet Order             DIET DYS 3 Room service appropriate? Yes; Fluid consistency: Thin  Diet effective now                    Inpatient Medications  Scheduled Meds:  insulin aspart  0-9 Units Subcutaneous Q4H   insulin glargine-yfgn  10 Units Subcutaneous Daily   pantoprazole (PROTONIX) IV  40 mg Intravenous Q12H   sodium chloride flush  3 mL Intravenous Q12H   Continuous Infusions: PRN Meds:.acetaminophen **OR** acetaminophen, labetalol, prochlorperazine  Antibiotics  :    Anti-infectives (From admission, onward)    None         Objective:   Vitals:   06/21/23 0400 06/21/23 0806 06/21/23 0817 06/21/23 1215  BP: (!) 131/59 133/65 133/65   Pulse: (!) 101 (!) 102 97   Resp: 12 17 16 15   Temp:  98 F (36.7 C)    TempSrc:  Oral    SpO2: 98% 95% 98%   Weight:      Height:        Wt Readings from Last 3 Encounters:  06/18/23 93.9 kg  06/14/23 94.3 kg  05/16/23 94.4 kg    No intake or output data in the 24 hours ending 06/21/23 1322    Physical Exam  Awake Alert, Oriented X 3, No new F.N deficits, Normal affect Symmetrical Chest wall movement, Good air movement bilaterally, CTAB RRR,No Gallops,Rubs or new Murmurs, No Parasternal Heave +ve B.Sounds, Abd Soft, No tenderness, No rebound -  guarding or rigidity. No Cyanosis, Clubbing or edema, No new Rash or bruise       Data Review:    Recent Labs  Lab 06/18/23 2343 06/19/23 0159 06/19/23 1741 06/19/23 2208 06/20/23 0442 06/20/23 1429 06/21/23 0444  WBC 11.8*  --  11.6* 11.9* 10.9* 10.1 11.5*  HGB 5.2*   < > 9.6* 9.5* 8.5* 8.9* 9.5*  HCT 16.3*   < > 28.7* 28.0* 25.1* 26.9* 28.5*  PLT 323  --  278 279 266 285 303  MCV 88.1  --  85.4 83.8 84.8 85.1 86.4  MCH 28.1  --  28.6 28.4 28.7 28.2 28.8  MCHC 31.9  --  33.4 33.9 33.9 33.1 33.3  RDW 14.0  --  14.5 14.7 14.8 15.0 15.3  LYMPHSABS 3.1  --   --   --   --   --  1.7  MONOABS 1.0  --   --   --   --   --  1.1*  EOSABS 0.2  --   --   --   --   --  0.1  BASOSABS 0.1  --   --   --   --   --  0.1   < > = values in this interval not displayed.    Recent Labs  Lab 06/18/23 2343 06/19/23 1741 06/20/23 0442 06/20/23 2216 06/21/23  0444  NA 137 136 136 137 140  K 3.9 4.0 3.9 3.6 3.6  CL 101 104 104 105 108  CO2 23 22 23 23 23   ANIONGAP 13 10 9 9 9   GLUCOSE 208* 217* 181* 246* 107*  BUN 45* 35* 29* 25* 24*  CREATININE 1.27* 1.08* 1.17* 1.09* 1.14*  AST 20  --   --   --   --   ALT 16  --   --   --   --   ALKPHOS 54  --   --   --   --   BILITOT 0.4  --   --   --   --   ALBUMIN 3.0*  --   --   --   --   BNP  --   --   --   --  14.2  MG  --  1.8  --   --  1.7  PHOS  --   --   --   --  4.6  CALCIUM 8.7* 8.7* 8.5* 8.8* 8.9      Recent Labs  Lab 06/18/23 2343 06/19/23 1741 06/20/23 0442 06/20/23 2216 06/21/23 0444  BNP  --   --   --   --  14.2  MG  --  1.8  --   --  1.7  CALCIUM 8.7* 8.7* 8.5* 8.8* 8.9    --------------------------------------------------------------------------------------------------------------- Lab Results  Component Value Date   CHOL 135 06/07/2023   HDL 45 (L) 06/07/2023   LDLCALC 72 06/07/2023   LDLDIRECT 54.0 08/10/2021   TRIG 101 06/07/2023   CHOLHDL 3.0 06/07/2023    Lab Results  Component Value Date   HGBA1C 10.1  (H) 06/07/2023   No results for input(s): "TSH", "T4TOTAL", "FREET4", "T3FREE", "THYROIDAB" in the last 72 hours. Recent Labs    06/19/23 0253  VITAMINB12 780  FOLATE 20.1  FERRITIN 16  TIBC 342  IRON 109  RETICCTPCT 4.4*   ------------------------------------------------------------------------------------------------------------------ Cardiac Enzymes No results for input(s): "CKMB", "TROPONINI", "MYOGLOBIN" in the last 168 hours.  Invalid input(s): "CK"  Micro Results No results found for this or any previous visit (from the past 240 hours).  Radiology Reports CT ABDOMEN PELVIS W CONTRAST Result Date: 06/19/2023 CLINICAL DATA:  Upper GI bleed, melanotic stool, anemic EXAM: CT ABDOMEN AND PELVIS WITH CONTRAST TECHNIQUE: Multidetector CT imaging of the abdomen and pelvis was performed using the standard protocol following bolus administration of intravenous contrast. RADIATION DOSE REDUCTION: This exam was performed according to the departmental dose-optimization program which includes automated exposure control, adjustment of the mA and/or kV according to patient size and/or use of iterative reconstruction technique. CONTRAST:  75mL OMNIPAQUE IOHEXOL 350 MG/ML SOLN COMPARISON:  None Available. FINDINGS: Lower chest: No acute abnormality. Hepatobiliary: Unremarkable liver. Cholecystectomy. No biliary dilation. Pancreas: Unremarkable. Spleen: Unremarkable. Adrenals/Urinary Tract: Normal adrenal glands. Cortical renal scarring upper pole of the left kidney. No urinary calculi or hydronephrosis. Bladder is unremarkable. Stomach/Bowel: Normal caliber large and small bowel. No bowel wall thickening. The appendix is normal.Stomach is within normal limits. Vascular/Lymphatic: Aortic atherosclerosis. No enlarged abdominal or pelvic lymph nodes. Reproductive: Unremarkable. Other: No free intraperitoneal fluid or air. Musculoskeletal: No acute fracture. IMPRESSION: 1. No acute abnormality in the  abdomen or pelvis. Aortic Atherosclerosis (ICD10-I70.0). Electronically Signed   By: Minerva Fester M.D.   On: 06/19/2023 03:37      Signature  -   Huey Bienenstock M.D on 06/21/2023 at 1:22 PM   -  To page go  to [email or web address]

## 2023-06-21 NOTE — Inpatient Diabetes Management (Signed)
Inpatient Diabetes Program Recommendations  AACE/ADA: New Consensus Statement on Inpatient Glycemic Control (2015)  Target Ranges:  Prepandial:   less than 140 mg/dL      Peak postprandial:   less than 180 mg/dL (1-2 hours)      Critically ill patients:  140 - 180 mg/dL   Lab Results  Component Value Date   GLUCAP 172 (H) 06/21/2023   HGBA1C 10.1 (H) 06/07/2023    Latest Reference Range & Units 06/20/23 07:55 06/20/23 11:43 06/20/23 16:22 06/20/23 19:58 06/21/23 03:53 06/21/23 05:01 06/21/23 08:06  Glucose-Capillary 70 - 99 mg/dL 161 (H) Novolog 1 unit 209 (H) Novolog 2 units 254 (H) Novolog 3 units 412 (H) 2211: Novolog 10 units 2317: Novolog 3 units 62 (L) 170 (H) 172 (H) Novolog 2 units  (H): Data is abnormally high (L): Data is abnormally low  Diabetes history: DM2 Outpatient Diabetes medications: Lantus 42 every day, Humalog 14-18 TID  Current orders for Inpatient glycemic control: Semglee 10 every day, Novolog 0-6 units Q4H   Inpatient Diabetes Program Recommendations:   Noted hypoglycemia after receiving Novolog 10 units @ 22:11 pm and 3 units @ 23:17. Please consider: -Increase Semglee to 12 units daily.  Thank you, Tara Conner. Bethanie Bloxom, RN, MSN, CDCES  Diabetes Coordinator Inpatient Glycemic Control Team Team Pager [phone] (8am-5pm) 06/21/2023 10:05 AM

## 2023-06-21 NOTE — Plan of Care (Signed)
  Problem: Education: Goal: Ability to describe self-care measures that may prevent or decrease complications (Diabetes Survival Skills Education) will improve Outcome: Progressing Goal: Individualized Educational Video(s) Outcome: Progressing   Problem: Health Behavior/Discharge Planning: Goal: Ability to identify and utilize available resources and services will improve Outcome: Progressing Goal: Ability to manage health-related needs will improve Outcome: Progressing   Problem: Fluid Volume: Goal: Ability to maintain a balanced intake and output will improve Outcome: Progressing

## 2023-06-21 NOTE — Progress Notes (Signed)
   06/21/23 1525  Mobility  Activity Ambulated with assistance in room  Level of Assistance Standby assist, set-up cues, supervision of patient - no hands on  Assistive Device Front wheel walker  Distance Ambulated (ft) 60 ft  Activity Response Tolerated fair  Mobility Referral Yes  Mobility visit 1 Mobility  Mobility Specialist Start Time (ACUTE ONLY) 1510  Mobility Specialist Stop Time (ACUTE ONLY) 1525  Mobility Specialist Time Calculation (min) (ACUTE ONLY) 15 min   Mobility Specialist: Progress Note  Post-Mobility: HR 104,  SpO2 100% RA  Pt agreeable to mobility session - received standing at countertop - RN present. C/o feeling "tired" at EOS, otherwise asymptomatic throughout session with no complaints. Returned to chair with all needs met - call bell within reach. Pt refused to have RW nearby and asked that it be placed near door. Pt encouraged to press call bell when having to use the BR.   Barnie Mort, BS Mobility Specialist Please contact via SecureChat or  Rehab office at [phone].

## 2023-06-21 NOTE — Anesthesia Postprocedure Evaluation (Signed)
Anesthesia Post Note  Patient: Tara Conner  Procedure(s) Performed: ENTEROSCOPY HEMOSTASIS CLIP PLACEMENT HEMOSTASIS CONTROL     Patient location during evaluation: PACU Anesthesia Type: MAC Level of consciousness: awake and alert Pain management: pain level controlled Vital Signs Assessment: post-procedure vital signs reviewed and stable Respiratory status: spontaneous breathing, nonlabored ventilation, respiratory function stable and patient connected to nasal cannula oxygen Cardiovascular status: stable and blood pressure returned to baseline Postop Assessment: no apparent nausea or vomiting Anesthetic complications: no   No notable events documented.  Last Vitals:  Vitals:   06/21/23 1215 06/21/23 1643  BP:  106/63  Pulse:  92  Resp: 15 14  Temp:    SpO2:  99%    Last Pain:  Vitals:   06/21/23 0817  TempSrc:   PainSc: 0-No pain                 Kennieth Rad

## 2023-06-21 NOTE — Care Management (Signed)
Patients CBG was noted to be 62, patient given [place] icy and gelatin.

## 2023-06-21 NOTE — Progress Notes (Signed)
     Progress Note    ASSESSMENT AND PLAN:   UGI bleed d/t Dieulafoy lesion s/p endoscopic therapy. Neg colon 2021. No NSAIDs. Neg CT AP with contrast this adm.  H/O CVA on Plavix  Plan: -Advance diet to soft diet today.  Regular in AM. -Trend CBC -Protonix 40 mg p.o. twice dailyx 4 weeks, then once a day -Resume Plavix 1/25 -Will sign off for now.     SUBJECTIVE   No further bleeding. Wants to eat.  Hemoglobin stable 9.5 today.    OBJECTIVE:     Vital signs in last 24 hours: Temp:  [97.9 F (36.6 C)-98.6 F (37 C)] 98 F (36.7 C) (01/22 0806) Pulse Rate:  [86-107] 97 (01/22 0817) Resp:  [12-19] 15 (01/22 1215) BP: (68-146)/(48-76) 133/65 (01/22 0817) SpO2:  [94 %-100 %] 98 % (01/22 0817) Last BM Date : 06/18/23 General:   Alert, well-developed female in NAD Abdomen:  Soft, nondistended, nontender.  Normal bowel sounds,.       Neurologic:  Alert and  oriented x4;  grossly normal neurologically. Psych:  Pleasant, cooperative.  Normal mood and affect.   Intake/Output from previous day: 01/21 0701 - 01/22 0700 In: 75 [I.V.:75] Out: -  Intake/Output this shift: No intake/output data recorded.  Lab Results: Recent Labs    06/20/23 0442 06/20/23 1429 06/21/23 0444  WBC 10.9* 10.1 11.5*  HGB 8.5* 8.9* 9.5*  HCT 25.1* 26.9* 28.5*  PLT 266 285 303   BMET Recent Labs    06/20/23 0442 06/20/23 2216 06/21/23 0444  NA 136 137 140  K 3.9 3.6 3.6  CL 104 105 108  CO2 23 23 23   GLUCOSE 181* 246* 107*  BUN 29* 25* 24*  CREATININE 1.17* 1.09* 1.14*  CALCIUM 8.5* 8.8* 8.9   LFT Recent Labs    06/18/23 2343  PROT 6.2*  ALBUMIN 3.0*  AST 20  ALT 16  ALKPHOS 54  BILITOT 0.4   PT/INR No results for input(s): "LABPROT", "INR" in the last 72 hours. Hepatitis Panel No results for input(s): "HEPBSAG", "HCVAB", "HEPAIGM", "HEPBIGM" in the last 72 hours.  No results found.   Principal Problem:   Symptomatic anemia Active Problems:   Type 2  diabetes mellitus with diabetic neuropathy, with long-term current use of insulin (HCC)   History of CVA (cerebrovascular accident)   Stage 3b chronic kidney disease (HCC)   Elevated troponin     LOS: 2 days     Edman Circle, MD 06/21/2023, 12:41 PM [facility] [phone]

## 2023-06-22 DIAGNOSIS — D649 Anemia, unspecified: Secondary | ICD-10-CM | POA: Diagnosis not present

## 2023-06-22 LAB — CBC WITH DIFFERENTIAL/PLATELET
Abs Immature Granulocytes: 0.04 10*3/uL (ref 0.00–0.07)
Basophils Absolute: 0.1 10*3/uL (ref 0.0–0.1)
Basophils Relative: 1 %
Eosinophils Absolute: 0.2 10*3/uL (ref 0.0–0.5)
Eosinophils Relative: 3 %
HCT: 24.9 % — ABNORMAL LOW (ref 36.0–46.0)
Hemoglobin: 8.2 g/dL — ABNORMAL LOW (ref 12.0–15.0)
Immature Granulocytes: 1 %
Lymphocytes Relative: 25 %
Lymphs Abs: 2.1 10*3/uL (ref 0.7–4.0)
MCH: 28.6 pg (ref 26.0–34.0)
MCHC: 32.9 g/dL (ref 30.0–36.0)
MCV: 86.8 fL (ref 80.0–100.0)
Monocytes Absolute: 0.8 10*3/uL (ref 0.1–1.0)
Monocytes Relative: 10 %
Neutro Abs: 5.3 10*3/uL (ref 1.7–7.7)
Neutrophils Relative %: 60 %
Platelets: 286 10*3/uL (ref 150–400)
RBC: 2.87 MIL/uL — ABNORMAL LOW (ref 3.87–5.11)
RDW: 15.3 % (ref 11.5–15.5)
WBC: 8.5 10*3/uL (ref 4.0–10.5)
nRBC: 0 % (ref 0.0–0.2)

## 2023-06-22 LAB — GLUCOSE, CAPILLARY
Glucose-Capillary: 132 mg/dL — ABNORMAL HIGH (ref 70–99)
Glucose-Capillary: 140 mg/dL — ABNORMAL HIGH (ref 70–99)
Glucose-Capillary: 144 mg/dL — ABNORMAL HIGH (ref 70–99)
Glucose-Capillary: 159 mg/dL — ABNORMAL HIGH (ref 70–99)
Glucose-Capillary: 187 mg/dL — ABNORMAL HIGH (ref 70–99)
Glucose-Capillary: 189 mg/dL — ABNORMAL HIGH (ref 70–99)
Glucose-Capillary: 241 mg/dL — ABNORMAL HIGH (ref 70–99)

## 2023-06-22 LAB — BASIC METABOLIC PANEL
Anion gap: 10 (ref 5–15)
BUN: 24 mg/dL — ABNORMAL HIGH (ref 8–23)
CO2: 21 mmol/L — ABNORMAL LOW (ref 22–32)
Calcium: 8.3 mg/dL — ABNORMAL LOW (ref 8.9–10.3)
Chloride: 103 mmol/L (ref 98–111)
Creatinine, Ser: 1.31 mg/dL — ABNORMAL HIGH (ref 0.44–1.00)
GFR, Estimated: 42 mL/min — ABNORMAL LOW (ref >60)
Glucose, Bld: 165 mg/dL — ABNORMAL HIGH (ref 70–99)
Potassium: 3.6 mmol/L (ref 3.5–5.1)
Sodium: 134 mmol/L — ABNORMAL LOW (ref 135–145)

## 2023-06-22 LAB — BRAIN NATRIURETIC PEPTIDE: B Natriuretic Peptide: 12.1 pg/mL (ref 0.0–100.0)

## 2023-06-22 LAB — PHOSPHORUS: Phosphorus: 4.6 mg/dL (ref 2.5–4.6)

## 2023-06-22 LAB — MAGNESIUM: Magnesium: 1.7 mg/dL (ref 1.7–2.4)

## 2023-06-22 MED ORDER — SODIUM CHLORIDE 0.9 % IV SOLN
100.0000 mg | INTRAVENOUS | Status: AC
  Administered 2023-06-22 – 2023-06-23 (×2): 100 mg via INTRAVENOUS
  Filled 2023-06-22 (×2): qty 5

## 2023-06-22 NOTE — Progress Notes (Signed)
   06/22/23 1126  SDOH Interventions  Transportation Interventions Inpatient TOC   Transportation resources added to AVS

## 2023-06-22 NOTE — Plan of Care (Signed)
  Problem: Education: Goal: Ability to describe self-care measures that may prevent or decrease complications (Diabetes Survival Skills Education) will improve Outcome: Progressing Goal: Individualized Educational Video(s) Outcome: Progressing   Problem: Coping: Goal: Ability to adjust to condition or change in health will improve Outcome: Progressing   Problem: Fluid Volume: Goal: Ability to maintain a balanced intake and output will improve Outcome: Progressing   Problem: Health Behavior/Discharge Planning: Goal: Ability to identify and utilize available resources and services will improve Outcome: Progressing Goal: Ability to manage health-related needs will improve Outcome: Progressing   Problem: Metabolic: Goal: Ability to maintain appropriate glucose levels will improve Outcome: Progressing   Problem: Nutritional: Goal: Maintenance of adequate nutrition will improve Outcome: Progressing Goal: Progress toward achieving an optimal weight will improve Outcome: Progressing   Problem: Skin Integrity: Goal: Risk for impaired skin integrity will decrease Outcome: Progressing   Problem: Tissue Perfusion: Goal: Adequacy of tissue perfusion will improve Outcome: Progressing   Problem: Education: Goal: Knowledge of General Education information will improve Description: Including pain rating scale, medication(s)/side effects and non-pharmacologic comfort measures Outcome: Progressing   Problem: Health Behavior/Discharge Planning: Goal: Ability to manage health-related needs will improve Outcome: Progressing   Problem: Clinical Measurements: Goal: Ability to maintain clinical measurements within normal limits will improve Outcome: Progressing Goal: Will remain free from infection Outcome: Progressing Goal: Diagnostic test results will improve Outcome: Progressing Goal: Respiratory complications will improve Outcome: Progressing Goal: Cardiovascular complication will  be avoided Outcome: Progressing   Problem: Activity: Goal: Risk for activity intolerance will decrease Outcome: Progressing   Problem: Nutrition: Goal: Adequate nutrition will be maintained Outcome: Progressing   Problem: Coping: Goal: Level of anxiety will decrease Outcome: Progressing   Problem: Elimination: Goal: Will not experience complications related to bowel motility Outcome: Progressing Goal: Will not experience complications related to urinary retention Outcome: Progressing   Problem: Pain Managment: Goal: General experience of comfort will improve and/or be controlled Outcome: Progressing   Problem: Safety: Goal: Ability to remain free from injury will improve Outcome: Progressing   Problem: Skin Integrity: Goal: Risk for impaired skin integrity will decrease Outcome: Progressing   Problem: Education: Goal: Ability to identify signs and symptoms of gastrointestinal bleeding will improve Outcome: Progressing   Problem: Bowel/Gastric: Goal: Will show no signs and symptoms of gastrointestinal bleeding Outcome: Progressing   Problem: Fluid Volume: Goal: Will show no signs and symptoms of excessive bleeding Outcome: Progressing   Problem: Clinical Measurements: Goal: Complications related to the disease process, condition or treatment will be avoided or minimized Outcome: Progressing

## 2023-06-22 NOTE — Care Management Important Message (Signed)
Important Message  Patient Details  Name: Tara Conner MRN: [id number] Date of Birth: 1945-10-02   Important Message Given:  Yes - Medicare IM     Dorena Bodo 06/22/2023, 2:38 PM

## 2023-06-22 NOTE — Progress Notes (Signed)
   06/22/23 0850  Mobility  Activity Ambulated independently in room  Level of Assistance Standby assist, set-up cues, supervision of patient - no hands on  Assistive Device None  Distance Ambulated (ft) 60 ft  Activity Response Tolerated well  Mobility Referral Yes  Mobility visit 1 Mobility  Mobility Specialist Start Time (ACUTE ONLY) [id number]  Mobility Specialist Stop Time (ACUTE ONLY) 0850  Mobility Specialist Time Calculation (min) (ACUTE ONLY) 18 min   Mobility Specialist: Progress Note  Pre-Mobility:   HR 91 , SpO2 100% RA Post-Mobility: HR 89, SpO2 99% RA  Pt agreeable to mobility session - received in chair. Pt was asymptomatic throughout session with no complaints. Returned to chair with all needs met - call bell within reach.   Barnie Mort, BS Mobility Specialist Please contact via SecureChat or  Rehab office at [phone].

## 2023-06-22 NOTE — Plan of Care (Signed)
  Problem: Skin Integrity: Goal: Risk for impaired skin integrity will decrease Outcome: Progressing   Problem: Tissue Perfusion: Goal: Adequacy of tissue perfusion will improve Outcome: Progressing   Problem: Clinical Measurements: Goal: Will remain free from infection Outcome: Progressing

## 2023-06-22 NOTE — Progress Notes (Signed)
PROGRESS NOTE                                                                                                                                                                                                             Patient Demographics:    Tara Conner, is a 78 y.o. female, DOB - [birth year], ZOX:[id number]  Outpatient Primary MD for the patient is [name], [name], NP    LOS - 3  Admit date - 06/18/2023    Chief Complaint  Patient presents with   Dizziness       Brief Narrative (HPI from H&P)     78 y.o. female with medical history significant for hypertension, insulin-dependent diabetes mellitus, history of CVA, and CKD 3B who presents with multiple complaints including lightheadedness and fatigue, and dark stools, diagnosed with upper GI bleed with anemia requiring 3 units of packed RBC transfusion.  Admitted to the hospital for further care.    Subjective:    Tara Conner with no BM overnight, tolerating her diet.    Assessment  & Plan :   Upper GI bleeding symptomatic anemia/acute blood loss anemia due to upper GI blood loss.   Dieulafoy lesion s/p endoscopic therapy  Iron-deficiency anemia - Presents with lightheadedness and fatigue, Hgb is 5.2 (10.0 on January 8th), and BUN is 45 (25 on January 8th). -s/p 3 units of packed RBC transfusion, H -Input greatly appreciated, status post endoscopy 1/21, significant for Dirulafoy lesion with visible vessel s/p endoscopic therapy -Continue to hold Plavix, may resume 1/25 -Protonix 40 mg p.o. twice daily x 4 weeks then once daily -Patient hemoglobin dropped to 8.2 today, was 9.5 yesterday, but she has no evidence of active GI bleed, this is most likely due to lab variation, will monitor another 24 hours, recheck in a.m. -Will start on IV iron today x 2 days starting from today   Hx of CVA - Hx of ischemic right pontine CVA in 2019, Plavix on hold due to  above, resume 1/25   3. Type II DM  - A1c was 10.0% in January 2025  -Continue with current dose of Semglee and insulin sliding scale.     4. CKD 3A - Appears close to baseline    5. Hypertension - Treat as-needed only for now     6. Elevated troponin - Troponin only  mildly elevated and flat; there is no chest discomfort, due to demand ischemia caused by extreme anemia.  No further workup.  Chest pain-free.       Condition - Fair  Family Communication  : None at bedside  Code Status :  Full  Consults  :  GI  PUD Prophylaxis : PPI   Procedures  :           Disposition Plan  :    Status is: Inpatient   DVT Prophylaxis  :    Place and maintain sequential compression device Start: 06/22/23 0745 Place and maintain sequential compression device Start: 06/21/23 1324 SCDs Start: 06/19/23 0409   Lab Results  Component Value Date   PLT 286 06/22/2023    Diet :  Diet Order             DIET DYS 3 Room service appropriate? Yes; Fluid consistency: Thin  Diet effective now                    Inpatient Medications  Scheduled Meds:  insulin aspart  0-9 Units Subcutaneous Q4H   insulin glargine-yfgn  12 Units Subcutaneous Daily   pantoprazole (PROTONIX) IV  40 mg Intravenous Q12H   sodium chloride flush  3 mL Intravenous Q12H   Continuous Infusions:  iron sucrose 100 mg (06/22/23 1334)   PRN Meds:.acetaminophen **OR** acetaminophen, labetalol, prochlorperazine  Antibiotics  :    Anti-infectives (From admission, onward)    None         Objective:   Vitals:   06/21/23 1940 06/21/23 2355 06/22/23 0350 06/22/23 0828  BP:  (!) 147/74 (!) 116/51 (!) 146/75  Pulse:    88  Resp:    13  Temp: 97.9 F (36.6 C) 98.1 F (36.7 C) 98.2 F (36.8 C) (!) 97.3 F (36.3 C)  TempSrc: Oral Oral Oral Oral  SpO2:   92% 100%  Weight:      Height:        Wt Readings from Last 3 Encounters:  06/18/23 93.9 kg  06/14/23 94.3 kg  05/16/23 94.4 kg      Intake/Output Summary (Last 24 hours) at 06/22/2023 1450 Last data filed at 06/22/2023 1300 Gross per 24 hour  Intake 720 ml  Output --  Net 720 ml      Physical Exam  Awake Alert, Oriented X 3, No new F.N deficits, Normal affect Symmetrical Chest wall movement, Good air movement bilaterally, CTAB RRR,No Gallops,Rubs or new Murmurs, No Parasternal Heave +ve B.Sounds, Abd Soft, No tenderness, No rebound - guarding or rigidity. No Cyanosis, Clubbing or edema, No new Rash or bruise        Data Review:    Recent Labs  Lab 06/18/23 2343 06/19/23 0159 06/19/23 2208 06/20/23 0442 06/20/23 1429 06/21/23 0444 06/22/23 0458  WBC 11.8*   < > 11.9* 10.9* 10.1 11.5* 8.5  HGB 5.2*   < > 9.5* 8.5* 8.9* 9.5* 8.2*  HCT 16.3*   < > 28.0* 25.1* 26.9* 28.5* 24.9*  PLT 323   < > 279 266 285 303 286  MCV 88.1   < > 83.8 84.8 85.1 86.4 86.8  MCH 28.1   < > 28.4 28.7 28.2 28.8 28.6  MCHC 31.9   < > 33.9 33.9 33.1 33.3 32.9  RDW 14.0   < > 14.7 14.8 15.0 15.3 15.3  LYMPHSABS 3.1  --   --   --   --  1.7 2.1  MONOABS  1.0  --   --   --   --  1.1* 0.8  EOSABS 0.2  --   --   --   --  0.1 0.2  BASOSABS 0.1  --   --   --   --  0.1 0.1   < > = values in this interval not displayed.    Recent Labs  Lab 06/18/23 2343 06/19/23 1741 06/20/23 0442 06/20/23 2216 06/21/23 0444 06/22/23 0458  NA 137 136 136 137 140 134*  K 3.9 4.0 3.9 3.6 3.6 3.6  CL 101 104 104 105 108 103  CO2 23 22 23 23 23  21*  ANIONGAP 13 10 9 9 9 10   GLUCOSE 208* 217* 181* 246* 107* 165*  BUN 45* 35* 29* 25* 24* 24*  CREATININE 1.27* 1.08* 1.17* 1.09* 1.14* 1.31*  AST 20  --   --   --   --   --   ALT 16  --   --   --   --   --   ALKPHOS 54  --   --   --   --   --   BILITOT 0.4  --   --   --   --   --   ALBUMIN 3.0*  --   --   --   --   --   BNP  --   --   --   --  14.2 12.1  MG  --  1.8  --   --  1.7 1.7  PHOS  --   --   --   --  4.6 4.6  CALCIUM 8.7* 8.7* 8.5* 8.8* 8.9 8.3*      Recent Labs  Lab  06/19/23 1741 06/20/23 0442 06/20/23 2216 06/21/23 0444 06/22/23 0458  BNP  --   --   --  14.2 12.1  MG 1.8  --   --  1.7 1.7  CALCIUM 8.7* 8.5* 8.8* 8.9 8.3*    --------------------------------------------------------------------------------------------------------------- Lab Results  Component Value Date   CHOL 135 06/07/2023   HDL 45 (L) 06/07/2023   LDLCALC 72 06/07/2023   LDLDIRECT 54.0 08/10/2021   TRIG 101 06/07/2023   CHOLHDL 3.0 06/07/2023    Lab Results  Component Value Date   HGBA1C 10.1 (H) 06/07/2023   No results for input(s): "TSH", "T4TOTAL", "FREET4", "T3FREE", "THYROIDAB" in the last 72 hours. No results for input(s): "VITAMINB12", "FOLATE", "FERRITIN", "TIBC", "IRON", "RETICCTPCT" in the last 72 hours.  ------------------------------------------------------------------------------------------------------------------ Cardiac Enzymes No results for input(s): "CKMB", "TROPONINI", "MYOGLOBIN" in the last 168 hours.  Invalid input(s): "CK"  Micro Results No results found for this or any previous visit (from the past 240 hours).  Radiology Reports CT ABDOMEN PELVIS W CONTRAST Result Date: 06/19/2023 CLINICAL DATA:  Upper GI bleed, melanotic stool, anemic EXAM: CT ABDOMEN AND PELVIS WITH CONTRAST TECHNIQUE: Multidetector CT imaging of the abdomen and pelvis was performed using the standard protocol following bolus administration of intravenous contrast. RADIATION DOSE REDUCTION: This exam was performed according to the departmental dose-optimization program which includes automated exposure control, adjustment of the mA and/or kV according to patient size and/or use of iterative reconstruction technique. CONTRAST:  75mL OMNIPAQUE IOHEXOL 350 MG/ML SOLN COMPARISON:  None Available. FINDINGS: Lower chest: No acute abnormality. Hepatobiliary: Unremarkable liver. Cholecystectomy. No biliary dilation. Pancreas: Unremarkable. Spleen: Unremarkable. Adrenals/Urinary Tract:  Normal adrenal glands. Cortical renal scarring upper pole of the left kidney. No urinary calculi or hydronephrosis. Bladder is unremarkable. Stomach/Bowel: Normal caliber large  and small bowel. No bowel wall thickening. The appendix is normal.Stomach is within normal limits. Vascular/Lymphatic: Aortic atherosclerosis. No enlarged abdominal or pelvic lymph nodes. Reproductive: Unremarkable. Other: No free intraperitoneal fluid or air. Musculoskeletal: No acute fracture. IMPRESSION: 1. No acute abnormality in the abdomen or pelvis. Aortic Atherosclerosis (ICD10-I70.0). Electronically Signed   By: Minerva Fester M.D.   On: 06/19/2023 03:37      Signature  -   Huey Bienenstock M.D on 06/22/2023 at 2:50 PM   -  To page go to [email or web address]

## 2023-06-22 NOTE — TOC Initial Note (Signed)
Transition of Care [facility]) - Initial/Assessment Note    Patient Details  Name: Tara Conner MRN: [id number] Date of Birth: July 19, 1945  [facility]) CM/SW Contact:    Lamonte Sakai, Student-Social Work Phone Number: 06/22/2023, 9:13 AM  Clinical Narrative:                 Pt. Admitted from home. TOC following for home health.   Expected Discharge Plan: Home w Home Health Services     Patient Goals and CMS Choice            Expected Discharge Plan and Services       Living arrangements for the past 2 months: Apartment                                      Prior Living Arrangements/Services Living arrangements for the past 2 months: Apartment Lives with:: Self                   Activities of Daily Living   ADL Screening (condition at time of admission) Independently performs ADLs?: Yes (appropriate for developmental age) Is the patient deaf or have difficulty hearing?: Yes Does the patient have difficulty seeing, even when wearing glasses/contacts?: No Does the patient have difficulty concentrating, remembering, or making decisions?: Yes  Permission Sought/Granted                  Emotional Assessment       Orientation: : Oriented to Self, Oriented to Place, Oriented to  Time, Oriented to Situation      Admission diagnosis:  Upper GI bleed [K92.2] Elevated troponin [R79.89] Symptomatic anemia [D64.9] Anemia, unspecified type [D64.9] Patient Active Problem List   Diagnosis Date Noted   Symptomatic anemia 06/19/2023   Elevated troponin 06/19/2023   Hyperlipidemia LDL goal <70 10/26/2021   Encounter to establish care 10/26/2021   Proliferative diabetic retinopathy of both eyes without macular edema associated with type 2 diabetes mellitus (HCC) 10/26/2021   Cerebrovascular accident (CVA) (HCC) 08/10/2021   Stage 3b chronic kidney disease (HCC) 07/10/2020   History of foot ulcer 02/19/2019   Gastroesophageal reflux  disease 05/10/2018   History of CVA (cerebrovascular accident) 04/08/2018   TIA (transient ischemic attack) 04/07/2018   Iron deficiency anemia 02/12/2018   Anemia 02/09/2018   Essential hypertension 01/30/2018   Type 2 diabetes mellitus with diabetic neuropathy, with long-term current use of insulin (HCC) 01/30/2018   Elevated LDL cholesterol level 01/30/2018   Screen for colon cancer 01/30/2018   PCP:  Caesar Bookman, NP Pharmacy:   [facility] [phone] - [place], [place] - [address] AT [facility] [address] [place] [address] Phone: [phone] Fax: [phone]     Social Drivers of Health (SDOH) Social History: SDOH Screenings   Food Insecurity: No Food Insecurity (06/19/2023)  Housing: Low Risk  (06/19/2023)  Transportation Needs: Unmet Transportation Needs (06/19/2023)  Utilities: Not At Risk (06/19/2023)  Alcohol Screen: Low Risk  (09/22/2021)  Depression (PHQ2-9): Low Risk  (06/14/2023)  Financial Resource Strain: Low Risk  (09/22/2021)  Physical Activity: Insufficiently Active (09/22/2021)  Social Connections: Socially Isolated (06/19/2023)  Stress: No Stress Concern Present (09/22/2021)  Tobacco Use: Low Risk  (06/20/2023)   SDOH Interventions:     Readmission Risk Interventions     No data to display

## 2023-06-22 NOTE — Progress Notes (Signed)
Physical Therapy Treatment Patient Details Name: Tara Conner MRN: [id number] DOB: 1945-08-07 Today's Date: 06/22/2023   History of Present Illness Pt is a 78 y/o F admitted on 06/18/23 after presenting with c/o lightheadedness, fatigue, dark stools. Pt was diagnosed with upper GI bleed with anemia. PMH: HTN, IDDM, CVA, CKD 3B    PT Comments  Pt received sitting in the recliner and agreeable to session. Pt demonstrates good stability throughout session with use of RW for ambulation. Pt able to tolerate gait and stair trials with up to CGA for safety. Pt instructed in various stair descent techniques with pt able to demonstrate. Pt reports plan to stay with daughter and grandchildren at discharge with 24/7 supervision and assist available. Pt continues to benefit from PT services to progress toward functional mobility goals.     If plan is discharge home, recommend the following: A little help with walking and/or transfers;A little help with bathing/dressing/bathroom;Assistance with cooking/housework;Help with stairs or ramp for entrance;Assist for transportation   Can travel by private vehicle        Equipment Recommendations  None recommended by PT    Recommendations for Other Services       Precautions / Restrictions Precautions Precautions: Fall Restrictions Weight Bearing Restrictions Per Provider Order: No     Mobility  Bed Mobility               General bed mobility comments: Pt in recliner at beginning and end of session    Transfers Overall transfer level: Modified independent Equipment used: None               General transfer comment: STS from recliner x2 with reliance on BUE for power up    Ambulation/Gait Ambulation/Gait assistance: Supervision Gait Distance (Feet): 200 Feet Assistive device: Rolling walker (2 wheels) Gait Pattern/deviations: Decreased stride length, Trunk flexed, Step-through pattern Gait velocity: slightly decreased      General Gait Details: slow, steady gait with cues for upright posture and light support on RW for balance   Stairs Stairs: Yes Stairs assistance: Contact guard assist Stair Management: One rail Right Number of Stairs: 5 General stair comments: Cues for technique with pt able to demonstrate with CGA for safety. Some weakness noted requiring increased time, but no overt instability      Balance Overall balance assessment: Needs assistance Sitting-balance support: No upper extremity supported, Feet supported Sitting balance-Leahy Scale: Good     Standing balance support: No upper extremity supported, During functional activity, Bilateral upper extremity supported Standing balance-Leahy Scale: Fair Standing balance comment: with RW support                            Cognition Arousal: Alert Behavior During Therapy: WFL for tasks assessed/performed Overall Cognitive Status: Within Functional Limits for tasks assessed                                          Exercises      General Comments        Pertinent Vitals/Pain Pain Assessment Pain Assessment: No/denies pain     PT Goals (current goals can now be found in the care plan section) Acute Rehab PT Goals Patient Stated Goal: get better PT Goal Formulation: With patient Time For Goal Achievement: 07/04/23 Progress towards PT goals: Progressing toward goals    Frequency  Min 1X/week       AM-PAC PT "6 Clicks" Mobility   Outcome Measure  Help needed turning from your back to your side while in a flat bed without using bedrails?: None   Help needed moving to and from a bed to a chair (including a wheelchair)?: None Help needed standing up from a chair using your arms (e.g., wheelchair or bedside chair)?: None Help needed to walk in hospital room?: A Little Help needed climbing 3-5 steps with a railing? : A Little 6 Click Score: 18    End of Session Equipment Utilized During  Treatment: Gait belt Activity Tolerance: Patient tolerated treatment well Patient left: in chair;with call bell/phone within reach Nurse Communication: Mobility status PT Visit Diagnosis: Muscle weakness (generalized) (M62.81);Unsteadiness on feet (R26.81)     Time: 7829-5621 PT Time Calculation (min) (ACUTE ONLY): 32 min  Charges:    $Gait Training: 23-37 mins PT General Charges $$ ACUTE PT VISIT: 1 Visit                     Johny Shock, PTA Acute Rehabilitation Services Secure Chat Preferred  [phone]    Johny Shock 06/22/2023, 12:44 PM

## 2023-06-22 NOTE — TOC Initial Note (Addendum)
Transition of Care [facility]) - Initial/Assessment Note    Patient Details  Name: Tara Conner MRN: [id number] Date of Birth: 01/08/1946  [facility]) CM/SW Contact:    Kingsley Plan, RN Phone Number: 06/22/2023, 11:31 AM  Clinical Narrative:                  Spoke to patient at bedside with PT. Patient states she is going to stay with her daughter at discharge. Daughter lives in [place]. Patient will get address and let nurse know. TOC will follow up.   Discussed HHPT . Patient agreeable.   Amy with [facility] accepted referral will just need [facility] address  Patient states she has a walker , cane and shower chair at home.  Expected Discharge Plan: Home w Home Health Services Barriers to Discharge: Continued Medical Work up   Patient Goals and CMS Choice Patient states their goals for this hospitalization and ongoing recovery are:: to return to home CMS Medicare.gov Compare Post Acute Care list provided to:: Patient Choice offered to / list presented to : Patient      Expected Discharge Plan and Services   Discharge Planning Services: CM Consult Post Acute Care Choice: Home Health Living arrangements for the past 2 months: Apartment                 DME Arranged: N/A DME Agency: NA       HH Arranged: PT HH Agency: [facility] Date HH Agency Contacted: 06/22/23 Time HH Agency Contacted: 1131 Representative spoke with at [facility] Agency: Amy  Prior Living Arrangements/Services Living arrangements for the past 2 months: Apartment Lives with:: Self Patient language and need for interpreter reviewed:: Yes Do you feel safe going back to the place where you live?: Yes      Need for Family Participation in Patient Care: Yes (Comment) Care giver support system in place?: Yes (comment) Current home services: DME Criminal Activity/Legal Involvement Pertinent to Current Situation/Hospitalization: No - Comment as needed  Activities of Daily Living    ADL Screening (condition at time of admission) Independently performs ADLs?: Yes (appropriate for developmental age) Is the patient deaf or have difficulty hearing?: Yes Does the patient have difficulty seeing, even when wearing glasses/contacts?: No Does the patient have difficulty concentrating, remembering, or making decisions?: Yes  Permission Sought/Granted   Permission granted to share information with : Yes, Verbal Permission Granted     Permission granted to share info w AGENCY: home health        Emotional Assessment Appearance:: Appears stated age Attitude/Demeanor/Rapport: Engaged Affect (typically observed): Accepting Orientation: : Oriented to Self, Oriented to Place, Oriented to  Time, Oriented to Situation Alcohol / Substance Use: Not Applicable Psych Involvement: No (comment)  Admission diagnosis:  Upper GI bleed [K92.2] Elevated troponin [R79.89] Symptomatic anemia [D64.9] Anemia, unspecified type [D64.9] Patient Active Problem List   Diagnosis Date Noted   Symptomatic anemia 06/19/2023   Elevated troponin 06/19/2023   Hyperlipidemia LDL goal <70 10/26/2021   Encounter to establish care 10/26/2021   Proliferative diabetic retinopathy of both eyes without macular edema associated with type 2 diabetes mellitus (HCC) 10/26/2021   Cerebrovascular accident (CVA) (HCC) 08/10/2021   Stage 3b chronic kidney disease (HCC) 07/10/2020   History of foot ulcer 02/19/2019   Gastroesophageal reflux disease 05/10/2018   History of CVA (cerebrovascular accident) 04/08/2018   TIA (transient ischemic attack) 04/07/2018   Iron deficiency anemia 02/12/2018   Anemia 02/09/2018   Essential hypertension 01/30/2018  Type 2 diabetes mellitus with diabetic neuropathy, with long-term current use of insulin (HCC) 01/30/2018   Elevated LDL cholesterol level 01/30/2018   Screen for colon cancer 01/30/2018   PCP:  Caesar Bookman, NP Pharmacy:   [facility] [phone] -  [place] Otto, [place] - [address] AT [facility] [address] [place] [address] Phone: [phone] Fax: [phone]     Social Drivers of Health (SDOH) Social History: SDOH Screenings   Food Insecurity: No Food Insecurity (06/19/2023)  Housing: Low Risk  (06/19/2023)  Transportation Needs: Unmet Transportation Needs (06/19/2023)  Utilities: Not At Risk (06/19/2023)  Alcohol Screen: Low Risk  (09/22/2021)  Depression (PHQ2-9): Low Risk  (06/14/2023)  Financial Resource Strain: Low Risk  (09/22/2021)  Physical Activity: Insufficiently Active (09/22/2021)  Social Connections: Socially Isolated (06/19/2023)  Stress: No Stress Concern Present (09/22/2021)  Tobacco Use: Low Risk  (06/20/2023)   SDOH Interventions: Transportation Interventions: Inpatient TOC   Readmission Risk Interventions     No data to display

## 2023-06-23 ENCOUNTER — Encounter (HOSPITAL_COMMUNITY): Payer: Self-pay | Admitting: Gastroenterology

## 2023-06-23 ENCOUNTER — Other Ambulatory Visit (HOSPITAL_COMMUNITY): Payer: Self-pay

## 2023-06-23 DIAGNOSIS — D649 Anemia, unspecified: Secondary | ICD-10-CM | POA: Diagnosis not present

## 2023-06-23 LAB — BASIC METABOLIC PANEL
Anion gap: 8 (ref 5–15)
BUN: 23 mg/dL (ref 8–23)
CO2: 23 mmol/L (ref 22–32)
Calcium: 8.7 mg/dL — ABNORMAL LOW (ref 8.9–10.3)
Chloride: 107 mmol/L (ref 98–111)
Creatinine, Ser: 1.16 mg/dL — ABNORMAL HIGH (ref 0.44–1.00)
GFR, Estimated: 49 mL/min — ABNORMAL LOW (ref >60)
Glucose, Bld: 170 mg/dL — ABNORMAL HIGH (ref 70–99)
Potassium: 3.8 mmol/L (ref 3.5–5.1)
Sodium: 138 mmol/L (ref 135–145)

## 2023-06-23 LAB — CBC WITH DIFFERENTIAL/PLATELET
Abs Immature Granulocytes: 0.03 10*3/uL (ref 0.00–0.07)
Basophils Absolute: 0.1 10*3/uL (ref 0.0–0.1)
Basophils Relative: 1 %
Eosinophils Absolute: 0.2 10*3/uL (ref 0.0–0.5)
Eosinophils Relative: 3 %
HCT: 24.8 % — ABNORMAL LOW (ref 36.0–46.0)
Hemoglobin: 8.3 g/dL — ABNORMAL LOW (ref 12.0–15.0)
Immature Granulocytes: 0 %
Lymphocytes Relative: 27 %
Lymphs Abs: 1.9 10*3/uL (ref 0.7–4.0)
MCH: 28.7 pg (ref 26.0–34.0)
MCHC: 33.5 g/dL (ref 30.0–36.0)
MCV: 85.8 fL (ref 80.0–100.0)
Monocytes Absolute: 0.7 10*3/uL (ref 0.1–1.0)
Monocytes Relative: 10 %
Neutro Abs: 4.2 10*3/uL (ref 1.7–7.7)
Neutrophils Relative %: 59 %
Platelets: 304 10*3/uL (ref 150–400)
RBC: 2.89 MIL/uL — ABNORMAL LOW (ref 3.87–5.11)
RDW: 15.1 % (ref 11.5–15.5)
WBC: 7.1 10*3/uL (ref 4.0–10.5)
nRBC: 0 % (ref 0.0–0.2)

## 2023-06-23 LAB — GLUCOSE, CAPILLARY
Glucose-Capillary: 155 mg/dL — ABNORMAL HIGH (ref 70–99)
Glucose-Capillary: 161 mg/dL — ABNORMAL HIGH (ref 70–99)
Glucose-Capillary: 171 mg/dL — ABNORMAL HIGH (ref 70–99)

## 2023-06-23 LAB — MAGNESIUM: Magnesium: 1.7 mg/dL (ref 1.7–2.4)

## 2023-06-23 LAB — PHOSPHORUS: Phosphorus: 4.6 mg/dL (ref 2.5–4.6)

## 2023-06-23 LAB — BRAIN NATRIURETIC PEPTIDE: B Natriuretic Peptide: 16.7 pg/mL (ref 0.0–100.0)

## 2023-06-23 MED ORDER — LANTUS SOLOSTAR 100 UNIT/ML ~~LOC~~ SOPN: 30.0000 [IU] | PEN_INJECTOR | Freq: Every day | SUBCUTANEOUS | Status: DC

## 2023-06-23 MED ORDER — CLOPIDOGREL BISULFATE 75 MG PO TABS: 75.0000 mg | ORAL_TABLET | Freq: Every day | ORAL | Status: DC

## 2023-06-23 MED ORDER — PANTOPRAZOLE SODIUM 40 MG PO TBEC
40.0000 mg | DELAYED_RELEASE_TABLET | Freq: Every day | ORAL | 0 refills | Status: DC
  Filled 2023-06-23: qty 90, 90d supply, fill #0

## 2023-06-23 MED ORDER — INSULIN LISPRO (1 UNIT DIAL) 100 UNIT/ML (KWIKPEN): PEN_INJECTOR | SUBCUTANEOUS | Status: DC

## 2023-06-23 MED ORDER — PANTOPRAZOLE SODIUM 40 MG PO TBEC
DELAYED_RELEASE_TABLET | ORAL | 0 refills | Status: DC
  Filled 2023-06-23: qty 120, 92d supply, fill #0

## 2023-06-23 MED ORDER — VITAMIN D 50 MCG (2000 UT) PO TABS
2000.0000 [IU] | ORAL_TABLET | Freq: Every day | ORAL | Status: AC
Start: 2023-06-23 — End: ?

## 2023-06-23 NOTE — Progress Notes (Signed)
Waiting for venofer iv infusion before d/c.

## 2023-06-23 NOTE — Plan of Care (Signed)
Problem: Education: Goal: Ability to describe self-care measures that may prevent or decrease complications (Diabetes Survival Skills Education) will improve Outcome: Progressing Goal: Individualized Educational Video(s) Outcome: Progressing   Problem: Coping: Goal: Ability to adjust to condition or change in health will improve Outcome: Progressing   Problem: Fluid Volume: Goal: Ability to maintain a balanced intake and output will improve Outcome: Progressing   Problem: Health Behavior/Discharge Planning: Goal: Ability to identify and utilize available resources and services will improve Outcome: Progressing Goal: Ability to manage health-related needs will improve Outcome: Progressing   Problem: Metabolic: Goal: Ability to maintain appropriate glucose levels will improve Outcome: Progressing   Problem: Nutritional: Goal: Maintenance of adequate nutrition will improve Outcome: Progressing Goal: Progress toward achieving an optimal weight will improve Outcome: Progressing   Problem: Skin Integrity: Goal: Risk for impaired skin integrity will decrease Outcome: Progressing   Problem: Tissue Perfusion: Goal: Adequacy of tissue perfusion will improve Outcome: Progressing   Problem: Education: Goal: Knowledge of General Education information will improve Description: Including pain rating scale, medication(s)/side effects and non-pharmacologic comfort measures Outcome: Progressing   Problem: Health Behavior/Discharge Planning: Goal: Ability to manage health-related needs will improve Outcome: Progressing   Problem: Clinical Measurements: Goal: Ability to maintain clinical measurements within normal limits will improve Outcome: Progressing Goal: Will remain free from infection Outcome: Progressing Goal: Diagnostic test results will improve Outcome: Progressing Goal: Respiratory complications will improve Outcome: Progressing Goal: Cardiovascular complication will  be avoided Outcome: Progressing   Problem: Activity: Goal: Risk for activity intolerance will decrease Outcome: Progressing   Problem: Nutrition: Goal: Adequate nutrition will be maintained Outcome: Progressing   Problem: Coping: Goal: Level of anxiety will decrease Outcome: Progressing   Problem: Elimination: Goal: Will not experience complications related to bowel motility Outcome: Progressing Goal: Will not experience complications related to urinary retention Outcome: Progressing   Problem: Pain Managment: Goal: General experience of comfort will improve and/or be controlled Outcome: Progressing   Problem: Safety: Goal: Ability to remain free from injury will improve Outcome: Progressing   Problem: Skin Integrity: Goal: Risk for impaired skin integrity will decrease Outcome: Progressing   Problem: Education: Goal: Ability to identify signs and symptoms of gastrointestinal bleeding will improve Outcome: Progressing   Problem: Bowel/Gastric: Goal: Will show no signs and symptoms of gastrointestinal bleeding Outcome: Progressing   Problem: Fluid Volume: Goal: Will show no signs and symptoms of excessive bleeding Outcome: Progressing   Problem: Clinical Measurements: Goal: Complications related to the disease process, condition or treatment will be avoided or minimized Outcome: Progressing

## 2023-06-23 NOTE — Progress Notes (Signed)
   06/23/23 0932  Mobility  Activity Ambulated independently in hallway  Level of Assistance Standby assist, set-up cues, supervision of patient - no hands on  Assistive Device None  Distance Ambulated (ft) 500 ft  Activity Response Tolerated fair  Mobility Referral Yes  Mobility visit 1 Mobility  Mobility Specialist Start Time (ACUTE ONLY) 0911  Mobility Specialist Stop Time (ACUTE ONLY) 0932  Mobility Specialist Time Calculation (min) (ACUTE ONLY) 21 min   Mobility Specialist: Progress Note  During Mobility: HR 105, SpO2 99% RA  Pt agreeable to mobility session - received in chair. Pt was asymptomatic throughout session with no complaints. Returned to chair with all needs met - call bell within reach. Pt with slow gait encouraged to use AD when walking, no LOB.   Barnie Mort, BS Mobility Specialist Please contact via SecureChat or  Rehab office at [phone].

## 2023-06-23 NOTE — Discharge Summary (Signed)
Physician Discharge Summary  Tara Conner ION:[id number] DOB: Oct 07, 1945 DOA: 06/18/2023  PCP: Caesar Bookman, NP  Admit date: 06/18/2023 Discharge date: 06/23/2023  Admitted From: (Home) Disposition:  (Home )  Recommendations for Outpatient Follow-up:  Follow up with PCP in 1-2 weeks Please obtain BMP/CBC in one week  Home Health: (YES)   Diet recommendation: Heart Healthy / Carb Modified  Brief/Interim Summary:  78 y.o. female with medical history significant for hypertension, insulin-dependent diabetes mellitus, history of CVA, and CKD 3B who presents with multiple complaints including lightheadedness and fatigue, and dark stools, diagnosed with upper GI bleed with anemia requiring 3 units of packed RBC transfusion.  Admitted to the hospital for further care.   Upper GI bleeding symptomatic anemia/acute blood loss anemia due to upper GI blood loss.   Dieulafoy lesion s/p endoscopic therapy  Iron-deficiency anemia - Presents with lightheadedness and fatigue, Hgb is 5.2 (10.0 on January 8th), and BUN is 45 (25 on January 8th). -s/p 3 units of packed RBC transfusion spittle stay, hemoglobin remained stable since transfusion.  It is 8.3 this morning, patient with no bowel movements overnight, no evidence of GI bleed. -Gastroenterology input greatly appreciated, status post endoscopy 1/21, significant for Dirulafoy lesion with visible vessel s/p endoscopic therapy -Plavix held during hospital stay, okay to resume 1/25 -Protonix 40 mg p.o. twice daily x 4 weeks then once daily per GI recommendation -With IV iron x 2, will be discharged on oral iron, to follow-up with GI as an outpatient    Hx of CVA - Hx of ischemic right pontine CVA in 2019, Plavix on hold due to above, resume 1/25   Type II DM  - A1c was 10.0% in January 2025  -Patient required much lower dose of swelling during hospital stay, patient reports she started following strict diet, so her insulin recommendation  were lowered to Lantus 30 units at bedtime, and Humalog sliding scale 0-8 before meals, as she did report some low CBG readings at home on her higher regimen (44 of Lantus and 14 of Humalog sliding scale)   CKD 3A - Appears close to baseline    Hypertension -continue with Norvasc    Elevated troponin - Troponin only mildly elevated and flat; there is no chest discomfort, due to demand ischemia caused by extreme anemia.  No further workup.  Chest pain-free.   Discharge Diagnoses:  Principal Problem:   Symptomatic anemia Active Problems:   Type 2 diabetes mellitus with diabetic neuropathy, with long-term current use of insulin (HCC)   History of CVA (cerebrovascular accident)   Stage 3b chronic kidney disease (HCC)   Elevated troponin    Discharge Instructions  Discharge Instructions     Diet - low sodium heart healthy   Complete by: As directed    Discharge instructions   Complete by: As directed    Follow with Primary MD [name], [name], NP in 7 days   Get CBC, CMP,  checked  by Primary MD next visit.    Activity: As tolerated with Full fall precautions use walker/cane & assistance as needed   Disposition Home    Diet: Heart Healthy / carb modified.   On your next visit with your primary care physician please Get Medicines reviewed and adjusted.   Please request your Prim.MD to go over all Hospital Tests and Procedure/Radiological results at the follow up, please get all Hospital records sent to your Prim MD by signing hospital release before you go home.   If you  experience worsening of your admission symptoms, develop shortness of breath, life threatening emergency, suicidal or homicidal thoughts you must seek medical attention immediately by calling 911 or calling your MD immediately  if symptoms less severe.  You Must read complete instructions/literature along with all the possible adverse reactions/side effects for all the Medicines you take and that have been  prescribed to you. Take any new Medicines after you have completely understood and accpet all the possible adverse reactions/side effects.   Do not drive, operating heavy machinery, perform activities at heights, swimming or participation in water activities or provide baby sitting services if your were admitted for syncope or siezures until you have seen by Primary MD or a Neurologist and advised to do so again.  Do not drive when taking Pain medications.    Do not take more than prescribed Pain, Sleep and Anxiety Medications  Special Instructions: If you have smoked or chewed Tobacco  in the last 2 yrs please stop smoking, stop any regular Alcohol  and or any Recreational drug use.  Wear Seat belts while driving.   Please note  You were cared for by a hospitalist during your hospital stay. If you have any questions about your discharge medications or the care you received while you were in the hospital after you are discharged, you can call the unit and asked to speak with the hospitalist on call if the hospitalist that took care of you is not available. Once you are discharged, your primary care physician will handle any further medical issues. Please note that NO REFILLS for any discharge medications will be authorized once you are discharged, as it is imperative that you return to your primary care physician (or establish a relationship with a primary care physician if you do not have one) for your aftercare needs so that they can reassess your need for medications and monitor your lab values.   Increase activity slowly   Complete by: As directed       Allergies as of 06/23/2023       Reactions   Penicillins    Said she was young had a reaction    Sulfa Antibiotics Other (See Comments)   Pt unable to report reaction Said she had a rash        Medication List     TAKE these medications    acetaminophen 500 MG tablet Commonly known as: TYLENOL Take 500 mg by mouth daily as  needed for mild pain (pain score 1-3).   ALKA-SELTZER PLUS COLD PO Take 1 tablet by mouth at bedtime as needed (for indigestion).   amLODipine 5 MG tablet Commonly known as: NORVASC TAKE 1 TABLET(5 MG) BY MOUTH DAILY   atorvastatin 40 MG tablet Commonly known as: LIPITOR TAKE 1 TABLET(40 MG) BY MOUTH DAILY   BD Pen Needle Nano 2nd Gen 32G X 4 MM Misc Generic drug: Insulin Pen Needle 1 Device by Other route 4 (four) times daily. E11.40   cimetidine 300 MG tablet Commonly known as: TAGAMET TAKE 1 TABLET(300 MG) BY MOUTH AT BEDTIME What changed: See the new instructions.   clopidogrel 75 MG tablet Commonly known as: PLAVIX Take 1 tablet (75 mg total) by mouth daily. TAKE 1 TABLET(75 MG) BY MOUTH DAILY Start taking on: June 24, 2023 What changed: See the new instructions.   ferrous sulfate 325 (65 FE) MG tablet Take 1 tablet (325 mg total) by mouth 2 (two) times daily with a meal. What changed: when to take this  fluticasone 50 MCG/ACT nasal spray Commonly known as: FLONASE Place 2 sprays into both nostrils in the morning and at bedtime.   gabapentin 300 MG capsule Commonly known as: NEURONTIN Take 1 capsule (300 mg total) by mouth 2 (two) times daily.   insulin lispro 100 UNIT/ML KwikPen Commonly known as: HumaLOG KwikPen ADMINISTER 0 TO8UNITS UNDER THE SKIN INTO SKIN THREE TIMES DAILY What changed: additional instructions   INSULIN SYRINGE 1CC/31GX5/16" 31G X 5/16" 1 ML Misc USE AS DIRECTED WITH LANTUS   Lantus SoloStar 100 UNIT/ML Solostar Pen Generic drug: insulin glargine Inject 30 Units into the skin at bedtime. What changed:  how much to take when to take this   losartan 25 MG tablet Commonly known as: COZAAR TAKE 1 TABLET(25 MG) BY MOUTH DAILY   MULTIVITAMIN ADULT PO Take 1 tablet by mouth daily.   NON FORMULARY Omega XL supplement twice daily.   ONE TOUCH ULTRA 2 w/Device Kit by Does not apply route.   OneTouch Ultra test strip Generic  drug: glucose blood TEST UPTO FOUR TIMES DAILY   pantoprazole 40 MG tablet Commonly known as: Protonix Take 40 mg oral twice daily for 4 weeks, then daily after that.   REFRESH DRY EYE THERAPY OP Place 1-2 drops into both eyes as needed (for dryness).   Vitamin D 50 MCG (2000 UT) tablet Take 1 tablet (2,000 Units total) by mouth daily. What changed:  medication strength how much to take        Follow-up Information     [facility] Follow up.   Why: Paramedic information: [address] [place] [address] [phone]                Allergies  Allergen Reactions   Penicillins     [place] Spaniel she was young had a reaction    Sulfa Antibiotics Other (See Comments)    Pt unable to report reaction Said she had a rash    Consultations: Gastroenterology   Procedures/Studies: CT ABDOMEN PELVIS W CONTRAST Result Date: 06/19/2023 CLINICAL DATA:  Upper GI bleed, melanotic stool, anemic EXAM: CT ABDOMEN AND PELVIS WITH CONTRAST TECHNIQUE: Multidetector CT imaging of the abdomen and pelvis was performed using the standard protocol following bolus administration of intravenous contrast. RADIATION DOSE REDUCTION: This exam was performed according to the departmental dose-optimization program which includes automated exposure control, adjustment of the mA and/or kV according to patient size and/or use of iterative reconstruction technique. CONTRAST:  75mL OMNIPAQUE IOHEXOL 350 MG/ML SOLN COMPARISON:  None Available. FINDINGS: Lower chest: No acute abnormality. Hepatobiliary: Unremarkable liver. Cholecystectomy. No biliary dilation. Pancreas: Unremarkable. Spleen: Unremarkable. Adrenals/Urinary Tract: Normal adrenal glands. Cortical renal scarring upper pole of the left kidney. No urinary calculi or hydronephrosis. Bladder is unremarkable. Stomach/Bowel: Normal caliber large and small bowel. No bowel wall thickening. The appendix is  normal.Stomach is within normal limits. Vascular/Lymphatic: Aortic atherosclerosis. No enlarged abdominal or pelvic lymph nodes. Reproductive: Unremarkable. Other: No free intraperitoneal fluid or air. Musculoskeletal: No acute fracture. IMPRESSION: 1. No acute abnormality in the abdomen or pelvis. Aortic Atherosclerosis (ICD10-I70.0). Electronically Signed   By: Minerva Fester M.D.   On: 06/19/2023 03:37   DG Knee Complete 4 Views Right Result Date: 06/10/2023 CLINICAL DATA:  Chronic right knee pain. EXAM: RIGHT KNEE - COMPLETE 4+ VIEW COMPARISON:  None Available. FINDINGS: No fracture or dislocation. Mild degenerative change of the patellofemoral joint with articular surface irregularity and osteophytosis. Enthesopathic change involving the superior pole of  the patella. No evidence of chondrocalcinosis. No definite knee joint effusion. Scattered adjacent vascular calcifications. Regional soft tissues appear normal. IMPRESSION: 1. Degenerative change of patellofemoral joint and enthesopathic change involving the superior pole of the patella. 2. Otherwise, no explanation for patient's chronic right knee pain. 3. Adjacent atherosclerotic disease. Electronically Signed   By: Simonne Come M.D.   On: 06/10/2023 13:36      Subjective: Significant events overnight, she denies any complaints, she ambulated with mobility specialist in the hallway earlier today.  No dizziness, no lightheadedness, no chest pain or shortness of breath.  Discharge Exam: Vitals:   06/23/23 0011 06/23/23 0758  BP: 134/69 (!) 151/76  Pulse: 84 98  Resp: 17 20  Temp: 98.5 F (36.9 C) 97.9 F (36.6 C)  SpO2:  97%   Vitals:   06/22/23 1645 06/22/23 2015 06/23/23 0011 06/23/23 0758  BP: (!) 142/68 (!) 160/69 134/69 (!) 151/76  Pulse: 86 82 84 98  Resp: 17 18 17 20   Temp:  98.7 F (37.1 C) 98.5 F (36.9 C) 97.9 F (36.6 C)  TempSrc:  Oral Oral Oral  SpO2: 97% 96%  97%  Weight:      Height:        General: Pt is  alert, awake, not in acute distress Cardiovascular: RRR, S1/S2 +, no rubs, no gallops Respiratory: CTA bilaterally, no wheezing, no rhonchi Abdominal: Soft, NT, ND, bowel sounds + Extremities: no edema, no cyanosis    The results of significant diagnostics from this hospitalization (including imaging, microbiology, ancillary and laboratory) are listed below for reference.     Microbiology: No results found for this or any previous visit (from the past 240 hours).   Labs: BNP (last 3 results) Recent Labs    06/21/23 0444 06/22/23 0458 06/23/23 0528  BNP 14.2 12.1 16.7   Basic Metabolic Panel: Recent Labs  Lab 06/19/23 1741 06/20/23 0442 06/20/23 2216 06/21/23 0444 06/22/23 0458 06/23/23 0528  NA 136 136 137 140 134* 138  K 4.0 3.9 3.6 3.6 3.6 3.8  CL 104 104 105 108 103 107  CO2 22 23 23 23  21* 23  GLUCOSE 217* 181* 246* 107* 165* 170*  BUN 35* 29* 25* 24* 24* 23  CREATININE 1.08* 1.17* 1.09* 1.14* 1.31* 1.16*  CALCIUM 8.7* 8.5* 8.8* 8.9 8.3* 8.7*  MG 1.8  --   --  1.7 1.7 1.7  PHOS  --   --   --  4.6 4.6 4.6   Liver Function Tests: Recent Labs  Lab 06/18/23 2343  AST 20  ALT 16  ALKPHOS 54  BILITOT 0.4  PROT 6.2*  ALBUMIN 3.0*   No results for input(s): "LIPASE", "AMYLASE" in the last 168 hours. No results for input(s): "AMMONIA" in the last 168 hours. CBC: Recent Labs  Lab 06/18/23 2343 06/19/23 0159 06/20/23 0442 06/20/23 1429 06/21/23 0444 06/22/23 0458 06/23/23 0528  WBC 11.8*   < > 10.9* 10.1 11.5* 8.5 7.1  NEUTROABS 7.4  --   --   --  8.5* 5.3 4.2  HGB 5.2*   < > 8.5* 8.9* 9.5* 8.2* 8.3*  HCT 16.3*   < > 25.1* 26.9* 28.5* 24.9* 24.8*  MCV 88.1   < > 84.8 85.1 86.4 86.8 85.8  PLT 323   < > 266 285 303 286 304   < > = values in this interval not displayed.   Cardiac Enzymes: No results for input(s): "CKTOTAL", "CKMB", "CKMBINDEX", "TROPONINI" in the last 168 hours. BNP:  Invalid input(s): "POCBNP" CBG: Recent Labs  Lab 06/22/23 1621  06/22/23 2044 06/23/23 0013 06/23/23 0422 06/23/23 0758  GLUCAP 241* 189* 171* 155* 161*   D-Dimer No results for input(s): "DDIMER" in the last 72 hours. Hgb A1c No results for input(s): "HGBA1C" in the last 72 hours. Lipid Profile No results for input(s): "CHOL", "HDL", "LDLCALC", "TRIG", "CHOLHDL", "LDLDIRECT" in the last 72 hours. Thyroid function studies No results for input(s): "TSH", "T4TOTAL", "T3FREE", "THYROIDAB" in the last 72 hours.  Invalid input(s): "FREET3" Anemia work up No results for input(s): "VITAMINB12", "FOLATE", "FERRITIN", "TIBC", "IRON", "RETICCTPCT" in the last 72 hours. Urinalysis    Component Value Date/Time   COLORURINE STRAW (A) 06/19/2023 0036   APPEARANCEUR CLEAR 06/19/2023 0036   LABSPEC 1.008 06/19/2023 0036   PHURINE 5.0 06/19/2023 0036   GLUCOSEU NEGATIVE 06/19/2023 0036   GLUCOSEU NEGATIVE 09/24/2019 1342   HGBUR NEGATIVE 06/19/2023 0036   BILIRUBINUR NEGATIVE 06/19/2023 0036   KETONESUR NEGATIVE 06/19/2023 0036   PROTEINUR NEGATIVE 06/19/2023 0036   UROBILINOGEN 0.2 09/24/2019 1342   NITRITE NEGATIVE 06/19/2023 0036   LEUKOCYTESUR NEGATIVE 06/19/2023 0036   Sepsis Labs Recent Labs  Lab 06/20/23 1429 06/21/23 0444 06/22/23 0458 06/23/23 0528  WBC 10.1 11.5* 8.5 7.1   Microbiology No results found for this or any previous visit (from the past 240 hours).   Time coordinating discharge: Over 30 minutes  SIGNED:   Huey Bienenstock, MD  [facility] 06/23/2023, 12:49 PM Pager   If 7PM-7AM, please contact night-coverage [email or web address] Password [email or web address]

## 2023-06-23 NOTE — Discharge Instructions (Signed)
Follow with Primary MD [name], [name], NP in 7 days   Get CBC, CMP,  checked  by Primary MD next visit.    Activity: As tolerated with Full fall precautions use walker/cane & assistance as needed   Disposition Home    Diet: Heart Healthy .   On your next visit with your primary care physician please Get Medicines reviewed and adjusted.   Please request your Prim.MD to go over all Hospital Tests and Procedure/Radiological results at the follow up, please get all Hospital records sent to your Prim MD by signing hospital release before you go home.   If you experience worsening of your admission symptoms, develop shortness of breath, life threatening emergency, suicidal or homicidal thoughts you must seek medical attention immediately by calling 911 or calling your MD immediately  if symptoms less severe.  You Must read complete instructions/literature along with all the possible adverse reactions/side effects for all the Medicines you take and that have been prescribed to you. Take any new Medicines after you have completely understood and accpet all the possible adverse reactions/side effects.   Do not drive, operating heavy machinery, perform activities at heights, swimming or participation in water activities or provide baby sitting services if your were admitted for syncope or siezures until you have seen by Primary MD or a Neurologist and advised to do so again.  Do not drive when taking Pain medications.    Do not take more than prescribed Pain, Sleep and Anxiety Medications  Special Instructions: If you have smoked or chewed Tobacco  in the last 2 yrs please stop smoking, stop any regular Alcohol  and or any Recreational drug use.  Wear Seat belts while driving.   Please note  You were cared for by a hospitalist during your hospital stay. If you have any questions about your discharge medications or the care you received while you were in the hospital after you are  discharged, you can call the unit and asked to speak with the hospitalist on call if the hospitalist that took care of you is not available. Once you are discharged, your primary care physician will handle any further medical issues. Please note that NO REFILLS for any discharge medications will be authorized once you are discharged, as it is imperative that you return to your primary care physician (or establish a relationship with a primary care physician if you do not have one) for your aftercare needs so that they can reassess your need for medications and monitor your lab values.

## 2023-06-23 NOTE — TOC Transition Note (Addendum)
Transition of Care [facility]) - Discharge Note   Patient Details  Name: Tara Conner MRN: [id number] Date of Birth: [birth year]  Transition of [facility]) CM/SW Contact:  Gordy Clement, RN Phone Number: 06/23/2023, 11:16 AM   Clinical Narrative:     Patient will DC to Daughter's home at DC [facility] [address] [place]  [address] [phone] [facility] notified of address  Patient's other Daughter, Fleet Contras will be transporting patient home. [facility] will provide PT. No additional TOC needs     Final next level of care: Home w Home Health Services Barriers to Discharge: Continued Medical Work up   Patient Goals and CMS Choice Patient states their goals for this hospitalization and ongoing recovery are:: to return to home CMS Medicare.gov Compare Post Acute Care list provided to:: Patient Choice offered to / list presented to : Patient      Discharge Placement                       Discharge Plan and Services Additional resources added to the After Visit Summary for     Discharge Planning Services: CM Consult Post Acute Care Choice: Home Health          DME Arranged: N/A DME Agency: NA       HH Arranged: PT HH Agency: [facility] Date [facility] Agency Contacted: 06/22/23 Time HH Agency Contacted: 1131 Representative spoke with at [facility] [place] Agency: Amy  Social Drivers of Health (SDOH) Interventions SDOH Screenings   Food Insecurity: No Food Insecurity (06/19/2023)  Housing: Low Risk  (06/19/2023)  Transportation Needs: Unmet Transportation Needs (06/19/2023)  Utilities: Not At Risk (06/19/2023)  Alcohol Screen: Low Risk  (09/22/2021)  Depression (PHQ2-9): Low Risk  (06/14/2023)  Financial Resource Strain: Low Risk  (09/22/2021)  Physical Activity: Insufficiently Active (09/22/2021)  Social Connections: Socially Isolated (06/19/2023)  Stress: No Stress Concern Present (09/22/2021)  Tobacco Use: Low Risk  (06/20/2023)     Readmission Risk  Interventions     No data to display

## 2023-06-26 ENCOUNTER — Telehealth: Payer: Self-pay

## 2023-06-26 NOTE — Transitions of Care (Post Inpatient/ED Visit) (Signed)
06/26/2023  Name: Tara Conner MRN: [id number] DOB: July 31, 1945  Today's TOC FU Call Status: Today's TOC FU Call Status:: Successful TOC FU Call Completed TOC FU Call Complete Date: 06/26/23 Patient's Name and Date of Birth confirmed.  Transition Care Management Follow-up Telephone Call Date of Discharge: 06/23/23 Discharge Facility: [facility]) Type of Discharge: Inpatient Admission Primary Inpatient Discharge Diagnosis:: Symptomatic Anemia How have you been since you were released from the hospital?: Better Any questions or concerns?: No  Items Reviewed: Did you receive and understand the discharge instructions provided?: Yes Medications obtained,verified, and reconciled?: Yes (Medications Reviewed) Any new allergies since your discharge?: No Dietary orders reviewed?: Yes Type of Diet Ordered:: Heart Healthy/ Carbohydrate Modified Do you have support at home?: Yes People in Home: child(ren), adult Name of Support/Comfort Primary Source: Patient is staying with her daughter, Tara Conner  Medications Reviewed Today: Medications Reviewed Today     Reviewed by Jodelle Gross, RN (Case Manager) on 06/26/23 at 1255  Med List Status: <None>   Medication Order Taking? Sig Documenting Provider Last Dose Status Informant  acetaminophen (TYLENOL) 500 MG tablet [id number] Yes Take 500 mg by mouth daily as needed for mild pain (pain score 1-3). [provider] Taking Active Self  amLODipine (NORVASC) 5 MG tablet [id number] Yes TAKE 1 TABLET(5 MG) BY MOUTH DAILY [name], [name], NP Taking Active Self  atorvastatin (LIPITOR) 40 MG tablet [id number] Yes TAKE 1 TABLET(40 MG) BY MOUTH DAILY [name], [name], NP Taking Active Self  Blood Glucose Monitoring Suppl (ONE TOUCH ULTRA 2) w/Device KIT [id number] Yes by Does not apply route. [provider] Taking Active Self  Chlorphen-Phenyleph-ASA (ALKA-SELTZER PLUS COLD PO) [id number] Yes Take 1 tablet by mouth at bedtime as needed  (for indigestion). [provider] Taking Active Self           Med Note (WHITE, TONIA S   Mon Jun 19, 2023  5:18 AM) Would take 5 days a week  Cholecalciferol (VITAMIN D) 50 MCG (2000 UT) tablet [id number] Yes Take 1 tablet (2,000 Units total) by mouth daily. Elgergawy, [name], MD Taking Active   cimetidine (TAGAMET) 300 MG tablet [id number] Yes TAKE 1 TABLET(300 MG) BY MOUTH AT BEDTIME  Patient taking differently: Take 300 mg by mouth at bedtime as needed.   Mliss Sax, MD Taking Active Self  clopidogrel (PLAVIX) 75 MG tablet [id number] Yes Take 1 tablet (75 mg total) by mouth daily. TAKE 1 TABLET(75 MG) BY MOUTH DAILY Elgergawy, [name], MD Taking Active   ferrous sulfate 325 (65 FE) MG tablet [id number] Yes Take 1 tablet (325 mg total) by mouth 2 (two) times daily with a meal.  Patient taking differently: Take 325 mg by mouth daily with breakfast.   [name], [name], NP Taking Active Self  fluticasone (FLONASE) 50 MCG/ACT nasal spray [id number] No Place 2 sprays into both nostrils in the morning and at bedtime.  Patient not taking: Reported on 06/19/2023   Read Drivers, MD Not Taking Active Self  gabapentin (NEURONTIN) 300 MG capsule [id number] Yes Take 1 capsule (300 mg total) by mouth 2 (two) times daily. [name], [name], NP Taking Active Self  glucose blood (ONETOUCH ULTRA) test strip [id number] Yes TEST UPTO FOUR TIMES DAILY [name], [name], NP Taking Active Self  Glycerin-Polysorbate 80 (REFRESH DRY EYE THERAPY OP) [id number] Yes Place 1-2 drops into both eyes as needed (for dryness).  [provider] Taking Active Self  insulin glargine (LANTUS SOLOSTAR) 100 UNIT/ML Solostar  Pen [id number] Yes Inject 30 Units into the skin at bedtime. Elgergawy, [name], MD Taking Active   insulin lispro (HUMALOG KWIKPEN) 100 UNIT/ML KwikPen [id number] Yes ADMINISTER 0 TO8UNITS UNDER THE SKIN INTO SKIN THREE TIMES DAILY Elgergawy, [name], MD Taking Active   Insulin Pen  Needle (BD PEN NEEDLE NANO 2ND GEN) 32G X 4 MM MISC [id number] Yes 1 Device by Other route 4 (four) times daily. E11.40 [name], [name], NP Taking Active Self  Insulin Syringe-Needle U-100 (INSULIN SYRINGE 1CC/31GX5/16") 31G X 5/16" 1 ML MISC [id number] Yes USE AS DIRECTED WITH LANTUS Mliss Sax, MD Taking Active Self  losartan (COZAAR) 25 MG tablet [id number] Yes TAKE 1 TABLET(25 MG) BY MOUTH DAILY [name], [name], NP Taking Active Self  Multiple Vitamins-Minerals (MULTIVITAMIN ADULT PO) [id number] Yes Take 1 tablet by mouth daily. [provider] Taking Active Self  NON FORMULARY [id number] Yes Omega XL supplement twice daily. [provider] Taking Active Self  pantoprazole (PROTONIX) 40 MG tablet [id number] Yes Take 40 mg oral twice daily for 4 weeks, then daily after that. Elgergawy, [name], MD Taking Active             Home Care and Equipment/Supplies: Were Home Health Services Ordered?: Yes Name of Home Health Agency:: [phone] Has Agency set up a time to come to your home?: Yes First Home Health Visit Date: 06/27/23 [facility] callled patient today) Any new equipment or medical supplies ordered?: No  Functional Questionnaire: Do you need assistance with bathing/showering or dressing?: No Do you need assistance with meal preparation?: Yes Do you need assistance with eating?: No Do you have difficulty maintaining continence: No Do you need assistance with getting out of bed/getting out of a chair/moving?: No Do you have difficulty managing or taking your medications?: No  Follow up appointments reviewed: PCP Follow-up appointment confirmed?: Yes Date of PCP follow-up appointment?: 06/29/23 Follow-up Provider: Richarda Blade, NP [facility] Follow-up appointment confirmed?: NA Do you need transportation to your follow-up appointment?: No Do you understand care options if your condition(s) worsen?: Yes-patient verbalized understanding  SDOH  Interventions Today    Flowsheet Row Most Recent Value  SDOH Interventions   Food Insecurity Interventions Intervention Not Indicated  Housing Interventions Intervention Not Indicated  Transportation Interventions Intervention Not Indicated  Utilities Interventions Intervention Not Indicated      Jodelle Gross RN, BSN, CCM [place]  [facility] Manager Population Health Direct Dial: [phone]  Fax: [phone]

## 2023-06-27 DIAGNOSIS — R7989 Other specified abnormal findings of blood chemistry: Secondary | ICD-10-CM | POA: Diagnosis not present

## 2023-06-27 DIAGNOSIS — Z8673 Personal history of transient ischemic attack (TIA), and cerebral infarction without residual deficits: Secondary | ICD-10-CM | POA: Diagnosis not present

## 2023-06-27 DIAGNOSIS — D649 Anemia, unspecified: Secondary | ICD-10-CM | POA: Diagnosis not present

## 2023-06-29 ENCOUNTER — Ambulatory Visit (INDEPENDENT_AMBULATORY_CARE_PROVIDER_SITE_OTHER): Payer: 59 | Admitting: Family

## 2023-06-29 ENCOUNTER — Encounter: Payer: Self-pay | Admitting: Family

## 2023-06-29 VITALS — BP 138/88 | HR 81 | Temp 98.2°F | Resp 17 | Ht 67.0 in | Wt 210.6 lb

## 2023-06-29 DIAGNOSIS — E1140 Type 2 diabetes mellitus with diabetic neuropathy, unspecified: Secondary | ICD-10-CM

## 2023-06-29 DIAGNOSIS — K219 Gastro-esophageal reflux disease without esophagitis: Secondary | ICD-10-CM | POA: Diagnosis not present

## 2023-06-29 DIAGNOSIS — K922 Gastrointestinal hemorrhage, unspecified: Secondary | ICD-10-CM

## 2023-06-29 DIAGNOSIS — I700 Atherosclerosis of aorta: Secondary | ICD-10-CM | POA: Diagnosis not present

## 2023-06-29 DIAGNOSIS — Z8673 Personal history of transient ischemic attack (TIA), and cerebral infarction without residual deficits: Secondary | ICD-10-CM | POA: Diagnosis not present

## 2023-06-29 DIAGNOSIS — D509 Iron deficiency anemia, unspecified: Secondary | ICD-10-CM | POA: Diagnosis not present

## 2023-06-29 DIAGNOSIS — Z794 Long term (current) use of insulin: Secondary | ICD-10-CM | POA: Diagnosis not present

## 2023-06-29 DIAGNOSIS — I10 Essential (primary) hypertension: Secondary | ICD-10-CM | POA: Diagnosis not present

## 2023-06-29 DIAGNOSIS — K5901 Slow transit constipation: Secondary | ICD-10-CM

## 2023-06-29 DIAGNOSIS — E785 Hyperlipidemia, unspecified: Secondary | ICD-10-CM | POA: Diagnosis not present

## 2023-06-29 DIAGNOSIS — N1832 Chronic kidney disease, stage 3b: Secondary | ICD-10-CM

## 2023-06-29 MED ORDER — POLYETHYLENE GLYCOL 3350 17 GM/SCOOP PO POWD: 17.0000 g | Freq: Two times a day (BID) | ORAL | 1 refills | Status: AC | PRN

## 2023-06-29 MED ORDER — ACCU-CHEK SOFTCLIX LANCETS MISC: 12 refills | Status: DC

## 2023-06-30 DIAGNOSIS — D649 Anemia, unspecified: Secondary | ICD-10-CM | POA: Diagnosis not present

## 2023-06-30 DIAGNOSIS — R7989 Other specified abnormal findings of blood chemistry: Secondary | ICD-10-CM | POA: Diagnosis not present

## 2023-06-30 DIAGNOSIS — Z8673 Personal history of transient ischemic attack (TIA), and cerebral infarction without residual deficits: Secondary | ICD-10-CM | POA: Diagnosis not present

## 2023-06-30 LAB — BASIC METABOLIC PANEL
BUN/Creatinine Ratio: 13 (calc) (ref 6–22)
BUN: 17 mg/dL (ref 7–25)
CO2: 23 mmol/L (ref 20–32)
Calcium: 9.2 mg/dL (ref 8.6–10.4)
Chloride: 106 mmol/L (ref 98–110)
Creat: 1.3 mg/dL — ABNORMAL HIGH (ref 0.60–1.00)
Glucose, Bld: 189 mg/dL — ABNORMAL HIGH (ref 65–99)
Potassium: 4.5 mmol/L (ref 3.5–5.3)
Sodium: 138 mmol/L (ref 135–146)

## 2023-06-30 LAB — CBC WITH DIFFERENTIAL/PLATELET
Absolute Lymphocytes: 1754 {cells}/uL (ref 850–3900)
Absolute Monocytes: 540 {cells}/uL (ref 200–950)
Basophils Absolute: 67 {cells}/uL (ref 0–200)
Basophils Relative: 0.9 %
Eosinophils Absolute: 111 {cells}/uL (ref 15–500)
Eosinophils Relative: 1.5 %
HCT: 28.8 % — ABNORMAL LOW (ref 35.0–45.0)
Hemoglobin: 9.3 g/dL — ABNORMAL LOW (ref 11.7–15.5)
MCH: 28.4 pg (ref 27.0–33.0)
MCHC: 32.3 g/dL (ref 32.0–36.0)
MCV: 87.8 fL (ref 80.0–100.0)
MPV: 9.3 fL (ref 7.5–12.5)
Monocytes Relative: 7.3 %
Neutro Abs: 4928 {cells}/uL (ref 1500–7800)
Neutrophils Relative %: 66.6 %
Platelets: 503 10*3/uL — ABNORMAL HIGH (ref 140–400)
RBC: 3.28 10*6/uL — ABNORMAL LOW (ref 3.80–5.10)
RDW: 14.7 % (ref 11.0–15.0)
Total Lymphocyte: 23.7 %
WBC: 7.4 10*3/uL (ref 3.8–10.8)

## 2023-07-02 NOTE — Progress Notes (Signed)
Provider: Richarda Blade FNP-C  Ry Moody, [name], NP  Patient Care Team: Micalah Cabezas, [name], NP as PCP - General (Family Medicine) [facility]  Extended Emergency Contact Information Primary Emergency Contact: [facility] Mobile Phone: [phone] Relation: Daughter Secondary Emergency Contact: Wilson,Rachel Mobile Phone: [phone] Relation: Daughter  Code Status: Full Code  Goals of care: Advanced Directive information    06/29/2023   10:06 AM  Advanced Directives  Does Patient Have a Medical Advance Directive? No  Does patient want to make changes to medical advance directive? No - Patient declined  Would patient like information on creating a medical advance directive? No - Patient declined     Chief Complaint  Patient presents with   Hospitalization Follow-up    Transitions of care.   Discussed the use of AI scribe software for clinical note transcription with the patient, who gave verbal consent to proceed.  The patient is a 78 year old female who presents for follow-up after hospitalization for dizziness and upper gastrointestinal bleeding. She has been staying with her daughter.  On June 18, 2023, she experienced persistent dizziness throughout the day, prompting her children to advise seeking medical attention, leading to her hospital admission. During hospitalization, she was diagnosed with upper gastrointestinal bleeding, which she was unaware of until informed by medical staff. She recalls having dark, reddish-brown stools, initially attributed to dietary changes. An endoscopy identified the bleeding source. Her hemoglobin dropped to 5.2, necessitating a transfusion of three units of blood, which increased her hemoglobin to 8.3. She was also given two vials of iron and instructed to continue taking iron supplements twice daily. She resumed taking Plavix on June 24, 2023, after it was held during her hospitalization due to the  bleeding.  Her insulin regimen was adjusted during the hospital stay, with Humalog set at 8 units and Lantus at 30 units. She has a history of diabetes and reports that her blood sugar levels have been well-controlled since her hospital discharge, with morning and lunch readings being particularly good. However, she notes higher readings before dinner, which she attributes to her largest meal of the day. She has been dieting and lost weight during her hospital stay due to a liquid diet for three days.  She has chronic kidney disease, stage 3, which is currently stable. Her blood pressure has been stable, and she continues to take losartan 25 mg for hypertension. She is also on atorvastatin 40 mg for cholesterol management and amlodipine 5 mg for blood pressure. She takes a multivitamin and vitamin D 2000 units daily. She uses Refresh for dry eyes and takes cimetidine as needed for large meals. She has experienced constipation due to iron supplements and plans to use Miralax to manage this.  She has been engaging in physical therapy to improve her mobility and has been walking regularly in her daughter's large living room as part of her exercise routine.       Past Medical History:  Diagnosis Date   Diabetes mellitus without complication (HCC)    Diabetic retinopathy (HCC)    PDR OU   Hypertension    Hypertensive retinopathy    OU   Stage 3b chronic kidney disease (HCC) 07/10/2020   Stroke (HCC) 2019   Past Surgical History:  Procedure Laterality Date   CATARACT EXTRACTION Bilateral    COLONOSCOPY     [facility] possibly but not sure. [place] [facility] it was negative   ENTEROSCOPY N/A 06/20/2023   Procedure: ENTEROSCOPY;  Surgeon: Lynann Bologna,  MD;  Location: [facility] ENDOSCOPY;  Service: Gastroenterology;  Laterality: N/A;   ESOPHAGOGASTRODUODENOSCOPY     around the same time said it was negative    EYE SURGERY Bilateral    Cat Sx OU   GALLBLADDER SURGERY     HEMOSTASIS CLIP PLACEMENT   06/20/2023   Procedure: HEMOSTASIS CLIP PLACEMENT;  Surgeon: Lynann Bologna, MD;  Location: [facility] ENDOSCOPY;  Service: Gastroenterology;;   HEMOSTASIS CONTROL  06/20/2023   Procedure: HEMOSTASIS CONTROL;  Surgeon: Lynann Bologna, MD;  Location: [facility] ENDOSCOPY;  Service: Gastroenterology;;    Allergies  Allergen Reactions   Penicillins     Said she was young had a reaction    Sulfa Antibiotics Other (See Comments)    Pt unable to report reaction Said she had a rash    Outpatient Encounter Medications as of 06/29/2023  Medication Sig   Accu-Chek Softclix Lancets lancets Use as instructed   acetaminophen (TYLENOL) 500 MG tablet Take 500 mg by mouth daily as needed for mild pain (pain score 1-3).   amLODipine (NORVASC) 5 MG tablet TAKE 1 TABLET(5 MG) BY MOUTH DAILY   atorvastatin (LIPITOR) 40 MG tablet TAKE 1 TABLET(40 MG) BY MOUTH DAILY   Blood Glucose Monitoring Suppl (ONE TOUCH ULTRA 2) w/Device KIT by Does not apply route.   Chlorphen-Phenyleph-ASA (ALKA-SELTZER PLUS COLD PO) Take 1 tablet by mouth at bedtime as needed (for indigestion).   Cholecalciferol (VITAMIN D) 50 MCG (2000 UT) tablet Take 1 tablet (2,000 Units total) by mouth daily.   cimetidine (TAGAMET) 300 MG tablet TAKE 1 TABLET(300 MG) BY MOUTH AT BEDTIME (Patient taking differently: Take 300 mg by mouth at bedtime as needed.)   clopidogrel (PLAVIX) 75 MG tablet Take 1 tablet (75 mg total) by mouth daily. TAKE 1 TABLET(75 MG) BY MOUTH DAILY   ferrous sulfate 325 (65 FE) MG tablet Take 1 tablet (325 mg total) by mouth 2 (two) times daily with a meal. (Patient taking differently: Take 325 mg by mouth daily with breakfast.)   fluticasone (FLONASE) 50 MCG/ACT nasal spray Place 2 sprays into both nostrils in the morning and at bedtime.   gabapentin (NEURONTIN) 300 MG capsule Take 1 capsule (300 mg total) by mouth 2 (two) times daily.   glucose blood (ONETOUCH ULTRA) test strip TEST UPTO FOUR TIMES DAILY   Glycerin-Polysorbate 80 (REFRESH  DRY EYE THERAPY OP) Place 1-2 drops into both eyes as needed (for dryness).   insulin glargine (LANTUS SOLOSTAR) 100 UNIT/ML Solostar Pen Inject 30 Units into the skin at bedtime.   insulin lispro (HUMALOG KWIKPEN) 100 UNIT/ML KwikPen ADMINISTER 0 TO8UNITS UNDER THE SKIN INTO SKIN THREE TIMES DAILY   Insulin Pen Needle (BD PEN NEEDLE NANO 2ND GEN) 32G X 4 MM MISC 1 Device by Other route 4 (four) times daily. E11.40   Insulin Syringe-Needle U-100 (INSULIN SYRINGE 1CC/31GX5/16") 31G X 5/16" 1 ML MISC USE AS DIRECTED WITH LANTUS   losartan (COZAAR) 25 MG tablet TAKE 1 TABLET(25 MG) BY MOUTH DAILY   Multiple Vitamins-Minerals (MULTIVITAMIN ADULT PO) Take 1 tablet by mouth daily.   NON FORMULARY Omega XL supplement twice daily.   pantoprazole (PROTONIX) 40 MG tablet Take 40 mg oral twice daily for 4 weeks, then daily after that.   polyethylene glycol powder (GLYCOLAX/MIRALAX) 17 GM/SCOOP powder Take 17 g by mouth 2 (two) times daily as needed.   No facility-administered encounter medications on file as of 06/29/2023.    Review of Systems  Constitutional:  Negative for appetite change, chills,  fatigue, fever and unexpected weight change.  HENT:  Negative for congestion, ear discharge, ear pain, hearing loss, nosebleeds, postnasal drip, rhinorrhea, sinus pressure, sinus pain, sneezing, sore throat, tinnitus and trouble swallowing.   Eyes:  Negative for pain, discharge, redness, itching and visual disturbance.  Respiratory:  Negative for cough, chest tightness, shortness of breath and wheezing.   Cardiovascular:  Negative for chest pain, palpitations and leg swelling.  Gastrointestinal:  Negative for abdominal distention, abdominal pain, blood in stool, constipation, diarrhea, nausea and vomiting.  Endocrine: Negative for cold intolerance, heat intolerance, polydipsia, polyphagia and polyuria.  Genitourinary:  Negative for difficulty urinating, dysuria, flank pain, frequency and urgency.   Musculoskeletal:  Positive for arthralgias and gait problem. Negative for back pain, joint swelling, myalgias, neck pain and neck stiffness.  Skin:  Negative for color change, pallor, rash and wound.  Neurological:  Negative for dizziness, syncope, speech difficulty, weakness, light-headedness, numbness and headaches.  Hematological:  Does not bruise/bleed easily.  Psychiatric/Behavioral:  Negative for agitation, behavioral problems, confusion, hallucinations, self-injury, sleep disturbance and suicidal ideas. The patient is not nervous/anxious.     Immunization History  Administered Date(s) Administered   Influenza, High Dose Seasonal PF 03/07/2018   Influenza,inj,Quad PF,6+ Mos 01/31/2019   PFIZER(Purple Top)SARS-COV-2 Vaccination 10/03/2019, 10/29/2019   Pertinent  Health Maintenance Due  Topic Date Due   DEXA SCAN  Never done   FOOT EXAM  10/27/2022   OPHTHALMOLOGY EXAM  06/24/2023   MAMMOGRAM  08/19/2023   HEMOGLOBIN A1C  12/05/2023   INFLUENZA VACCINE  Discontinued   Colonoscopy  Discontinued      01/27/2023    1:00 PM 02/22/2023   10:46 AM 05/16/2023    2:45 PM 06/14/2023   11:18 AM 06/29/2023   10:05 AM  Fall Risk  Falls in the past year? 0 0 0 0 0  Was there an injury with Fall?   0 0 0  Fall Risk Category Calculator   0 0 0   Functional Status Survey:    Vitals:   06/29/23 1014  BP: 138/88  Pulse: 81  Resp: 17  Temp: 98.2 F (36.8 C)  SpO2: 97%  Weight: 210 lb 9.6 oz (95.5 kg)  Height: 5\' 7"  (1.702 m)   Body mass index is 32.98 kg/m. Physical Exam Vitals reviewed.  Constitutional:      General: She is not in acute distress.    Appearance: Normal appearance. She is normal weight. She is not ill-appearing or diaphoretic.  HENT:     Head: Normocephalic.     Right Ear: Tympanic membrane, ear canal and external ear normal. There is no impacted cerumen.     Left Ear: Tympanic membrane, ear canal and external ear normal. There is no impacted cerumen.      Nose: Nose normal. No congestion or rhinorrhea.     Mouth/Throat:     Mouth: Mucous membranes are moist.     Pharynx: Oropharynx is clear. No oropharyngeal exudate or posterior oropharyngeal erythema.  Eyes:     General: No scleral icterus.       Right eye: No discharge.        Left eye: No discharge.     Extraocular Movements: Extraocular movements intact.     Conjunctiva/sclera: Conjunctivae normal.     Pupils: Pupils are equal, round, and reactive to light.  Neck:     Vascular: No carotid bruit.  Cardiovascular:     Rate and Rhythm: Normal rate and regular rhythm.  Pulses: Normal pulses.     Heart sounds: Normal heart sounds. No murmur heard.    No friction rub. No gallop.  Pulmonary:     Effort: Pulmonary effort is normal. No respiratory distress.     Breath sounds: Normal breath sounds. No wheezing, rhonchi or rales.  Chest:     Chest wall: No tenderness.  Abdominal:     General: Bowel sounds are normal. There is no distension.     Palpations: Abdomen is soft. There is no mass.     Tenderness: There is no abdominal tenderness. There is no right CVA tenderness, left CVA tenderness, guarding or rebound.  Musculoskeletal:        General: No swelling or tenderness. Normal range of motion.     Cervical back: Normal range of motion. No rigidity or tenderness.     Right lower leg: No edema.     Left lower leg: No edema.  Lymphadenopathy:     Cervical: No cervical adenopathy.  Skin:    General: Skin is warm and dry.     Coloration: Skin is not pale.     Findings: No bruising, erythema, lesion or rash.  Neurological:     Mental Status: She is alert and oriented to person, place, and time.     Cranial Nerves: No cranial nerve deficit.     Sensory: No sensory deficit.     Motor: No weakness.     Coordination: Coordination normal.     Gait: Gait abnormal.  Psychiatric:        Mood and Affect: Mood normal.        Speech: Speech normal.        Behavior: Behavior normal.         Thought Content: Thought content normal.        Judgment: Judgment normal.     Labs reviewed: Recent Labs    06/21/23 0444 06/22/23 0458 06/23/23 0528 06/29/23 1134  NA 140 134* 138 138  K 3.6 3.6 3.8 4.5  CL 108 103 107 106  CO2 23 21* 23 23  GLUCOSE 107* 165* 170* 189*  BUN 24* 24* 23 17  CREATININE 1.14* 1.31* 1.16* 1.30*  CALCIUM 8.9 8.3* 8.7* 9.2  MG 1.7 1.7 1.7  --   PHOS 4.6 4.6 4.6  --    Recent Labs    05/10/23 0804 06/07/23 1042 06/18/23 2343  AST 13 17 20   ALT 9 9 16   ALKPHOS  --   --  54  BILITOT 0.2 0.3 0.4  PROT 7.2 7.6 6.2*  ALBUMIN  --   --  3.0*   Recent Labs    06/22/23 0458 06/23/23 0528 06/29/23 1134  WBC 8.5 7.1 7.4  NEUTROABS 5.3 4.2 4,928  HGB 8.2* 8.3* 9.3*  HCT 24.9* 24.8* 28.8*  MCV 86.8 85.8 87.8  PLT 286 304 503*   Lab Results  Component Value Date   TSH 1.32 06/07/2023   Lab Results  Component Value Date   HGBA1C 10.1 (H) 06/07/2023   Lab Results  Component Value Date   CHOL 135 06/07/2023   HDL 45 (L) 06/07/2023   LDLCALC 72 06/07/2023   LDLDIRECT 54.0 08/10/2021   TRIG 101 06/07/2023   CHOLHDL 3.0 06/07/2023    Significant Diagnostic Results in last 30 days:  CT ABDOMEN PELVIS W CONTRAST Result Date: 06/19/2023 CLINICAL DATA:  Upper GI bleed, melanotic stool, anemic EXAM: CT ABDOMEN AND PELVIS WITH CONTRAST TECHNIQUE: Multidetector CT imaging of the abdomen  and pelvis was performed using the standard protocol following bolus administration of intravenous contrast. RADIATION DOSE REDUCTION: This exam was performed according to the departmental dose-optimization program which includes automated exposure control, adjustment of the mA and/or kV according to patient size and/or use of iterative reconstruction technique. CONTRAST:  75mL OMNIPAQUE IOHEXOL 350 MG/ML SOLN COMPARISON:  None Available. FINDINGS: Lower chest: No acute abnormality. Hepatobiliary: Unremarkable liver. Cholecystectomy. No biliary dilation.  Pancreas: Unremarkable. Spleen: Unremarkable. Adrenals/Urinary Tract: Normal adrenal glands. Cortical renal scarring upper pole of the left kidney. No urinary calculi or hydronephrosis. Bladder is unremarkable. Stomach/Bowel: Normal caliber large and small bowel. No bowel wall thickening. The appendix is normal.Stomach is within normal limits. Vascular/Lymphatic: Aortic atherosclerosis. No enlarged abdominal or pelvic lymph nodes. Reproductive: Unremarkable. Other: No free intraperitoneal fluid or air. Musculoskeletal: No acute fracture. IMPRESSION: 1. No acute abnormality in the abdomen or pelvis. Aortic Atherosclerosis (ICD10-I70.0). Electronically Signed   By: Minerva Fester M.D.   On: 06/19/2023 03:37   DG Knee Complete 4 Views Right Result Date: 06/10/2023 CLINICAL DATA:  Chronic right knee pain. EXAM: RIGHT KNEE - COMPLETE 4+ VIEW COMPARISON:  None Available. FINDINGS: No fracture or dislocation. Mild degenerative change of the patellofemoral joint with articular surface irregularity and osteophytosis. Enthesopathic change involving the superior pole of the patella. No evidence of chondrocalcinosis. No definite knee joint effusion. Scattered adjacent vascular calcifications. Regional soft tissues appear normal. IMPRESSION: 1. Degenerative change of patellofemoral joint and enthesopathic change involving the superior pole of the patella. 2. Otherwise, no explanation for patient's chronic right knee pain. 3. Adjacent atherosclerotic disease. Electronically Signed   By: Simonne Come M.D.   On: 06/10/2023 13:36    Assessment/Plan  Upper Gastrointestinal Bleeding Recent hospitalization for upper gastrointestinal bleeding, presenting with dizziness and melena. Hemoglobin dropped to 5.2 g/dL, requiring three units of blood transfusion and iron supplementation. Endoscopy identified the bleeding source. Currently on Protonix 40 mg twice daily for four weeks, then once daily to prevent recurrence. No further  episodes of melena since discharge. Discussed the importance of monitoring for any signs of recurrent bleeding and the need for follow-up with gastroenterology. - Continue Protonix 40 mg twice daily for four weeks, then reduce to once daily - Order CBC to recheck hemoglobin levels - Order BMP to monitor kidney function and electrolytes - Refer to gastroenterology for follow-up  Iron Deficiency Anemia Iron deficiency anemia secondary to gastrointestinal bleeding. Currently on iron supplementation twice daily. Reports constipation as a side effect. Discussed the use of stool softeners or Miralax to manage constipation associated with iron supplementation. - Continue iron supplementation twice daily - Recommend stool softener or Miralax to manage constipation  Type 2 Diabetes Mellitus Blood sugar levels well-controlled in the morning and before lunch but elevated before dinner, with the highest reading at 300 mg/dL. Insulin regimen adjusted to Humalog 8 units with meals and Lantus 30 units at bedtime. Patient is dieting and has lost weight. Discussed potential use of continuous glucose monitor (Dexcom) with assistance from family. Emphasized the importance of regular blood sugar monitoring and dietary management. - Continue Humalog 8 units with meals - Continue Lantus 30 units at bedtime - Monitor blood sugar levels regularly - Discuss potential use of continuous glucose monitor (Dexcom) with assistance from family - Accu-Chek Softclix Lancets lancets; Use as instructed  Dispense: 100 each; Refill: 12  Chronic Kidney Disease Stage 3 Chronic kidney disease stage 3. Blood pressure and blood sugar control are crucial to preserving kidney function. Discussed  the importance of maintaining stable kidney function through regular monitoring and medication adherence. - Order BMP to monitor kidney function and electrolytes - Continue losartan 25 mg daily for blood pressure control  Hypertension Blood  pressure is well-controlled with current medication regimen. Recent reading was 138/88 mmHg. Discussed the importance of continuing current medications to maintain blood pressure control. - Continue losartan 25 mg daily - Continue amlodipine 5 mg daily - CBC with Differential/Platelet - Basic metabolic panel  Aortic Atherosclerosis CT scan revealed aortic atherosclerosis. Emphasis on dietary modifications to reduce cholesterol intake and prevent further plaque buildup. Discussed the importance of a diet low in cholesterol, rich in vegetables, and lean meats to manage atherosclerosis. - Encourage a diet low in cholesterol, rich in vegetables, and lean meats - Avoid pork, deep-fried foods, and high-fat foods  Gastroesophageal reflux disease without esophagitis Status post hospitalization for GI bleed  - dark colored stool has resolved  - continue on Protonix   History of CVA (cerebrovascular accident) Continue on Plavix and Atorvastatin   Slow transit constipation - encouraged to increase fiber in diet  - increase water intake to 6-8 glasses of water daily and exercise as tolerated  - continue on miralax  - polyethylene glycol powder (GLYCOLAX/MIRALAX) 17 GM/SCOOP powder; Take 17 g by mouth 2 (two) times daily as needed.  Dispense: 3350 g; Refill: 1  General Health Maintenance Following a diet and exercise regimen. Physical therapy initiated  for mobility improvement. Discussed the importance of continued physical activity and dietary modifications to support overall health. - Encourage continued physical activity and walking exercises - Reinforce dietary modifications to support overall health  Follow-up - Follow-up with primary care on April 23 - Foot doctor appointment on April 21 at 1:45 PM - Regular lab work on April 21 at 9:45 AM - Recheck weight to confirm recent changes.   Family/ staff Communication: Reviewed plan of care with patient verbalized understanding    Labs/tests ordered:  - CBC with Differential/Platelet - Basic metabolic panel  Next Appointment: Return if symptoms worsen or fail to improve.   Caesar Bookman, NP

## 2023-07-03 DIAGNOSIS — D649 Anemia, unspecified: Secondary | ICD-10-CM | POA: Diagnosis not present

## 2023-07-03 DIAGNOSIS — R7989 Other specified abnormal findings of blood chemistry: Secondary | ICD-10-CM | POA: Diagnosis not present

## 2023-07-03 DIAGNOSIS — Z8673 Personal history of transient ischemic attack (TIA), and cerebral infarction without residual deficits: Secondary | ICD-10-CM | POA: Diagnosis not present

## 2023-07-05 ENCOUNTER — Telehealth: Payer: Self-pay

## 2023-07-05 NOTE — Telephone Encounter (Signed)
 Patient left a voicemail because need refill for diabetic medication and Left message on voicemail for patient to return call when available

## 2023-07-06 ENCOUNTER — Telehealth: Payer: Self-pay

## 2023-07-06 DIAGNOSIS — D649 Anemia, unspecified: Secondary | ICD-10-CM | POA: Diagnosis not present

## 2023-07-06 DIAGNOSIS — H26492 Other secondary cataract, left eye: Secondary | ICD-10-CM | POA: Diagnosis not present

## 2023-07-06 DIAGNOSIS — H35373 Puckering of macula, bilateral: Secondary | ICD-10-CM | POA: Diagnosis not present

## 2023-07-06 DIAGNOSIS — E1140 Type 2 diabetes mellitus with diabetic neuropathy, unspecified: Secondary | ICD-10-CM

## 2023-07-06 DIAGNOSIS — Z8673 Personal history of transient ischemic attack (TIA), and cerebral infarction without residual deficits: Secondary | ICD-10-CM | POA: Diagnosis not present

## 2023-07-06 DIAGNOSIS — R7989 Other specified abnormal findings of blood chemistry: Secondary | ICD-10-CM | POA: Diagnosis not present

## 2023-07-06 DIAGNOSIS — E113593 Type 2 diabetes mellitus with proliferative diabetic retinopathy without macular edema, bilateral: Secondary | ICD-10-CM | POA: Diagnosis not present

## 2023-07-06 DIAGNOSIS — H40013 Open angle with borderline findings, low risk, bilateral: Secondary | ICD-10-CM | POA: Diagnosis not present

## 2023-07-06 LAB — HM DIABETES EYE EXAM

## 2023-07-06 MED ORDER — ONETOUCH ULTRASOFT LANCETS MISC: 1.0000 | Freq: Four times a day (QID) | 12 refills | Status: DC

## 2023-07-06 NOTE — Telephone Encounter (Signed)
 Patient called to say that she needs a rx for the ontouch ultra lancets sent to [facility].  RX sent as request

## 2023-07-07 ENCOUNTER — Telehealth (INDEPENDENT_AMBULATORY_CARE_PROVIDER_SITE_OTHER): Payer: Self-pay | Admitting: Otolaryngology

## 2023-07-07 NOTE — Telephone Encounter (Signed)
 Confirmed address for both appts on 07/10/2023.

## 2023-07-10 ENCOUNTER — Ambulatory Visit (INDEPENDENT_AMBULATORY_CARE_PROVIDER_SITE_OTHER): Payer: 59 | Admitting: Audiology

## 2023-07-10 ENCOUNTER — Ambulatory Visit (INDEPENDENT_AMBULATORY_CARE_PROVIDER_SITE_OTHER): Payer: 59

## 2023-07-10 VITALS — BP 111/73 | HR 95

## 2023-07-10 DIAGNOSIS — J3089 Other allergic rhinitis: Secondary | ICD-10-CM | POA: Diagnosis not present

## 2023-07-10 DIAGNOSIS — H6991 Unspecified Eustachian tube disorder, right ear: Secondary | ICD-10-CM | POA: Diagnosis not present

## 2023-07-10 DIAGNOSIS — R7989 Other specified abnormal findings of blood chemistry: Secondary | ICD-10-CM | POA: Diagnosis not present

## 2023-07-10 DIAGNOSIS — H65491 Other chronic nonsuppurative otitis media, right ear: Secondary | ICD-10-CM

## 2023-07-10 DIAGNOSIS — H90A31 Mixed conductive and sensorineural hearing loss, unilateral, right ear with restricted hearing on the contralateral side: Secondary | ICD-10-CM | POA: Diagnosis not present

## 2023-07-10 DIAGNOSIS — Z0110 Encounter for examination of ears and hearing without abnormal findings: Secondary | ICD-10-CM

## 2023-07-10 DIAGNOSIS — D649 Anemia, unspecified: Secondary | ICD-10-CM | POA: Diagnosis not present

## 2023-07-10 DIAGNOSIS — Z8673 Personal history of transient ischemic attack (TIA), and cerebral infarction without residual deficits: Secondary | ICD-10-CM | POA: Diagnosis not present

## 2023-07-10 NOTE — Progress Notes (Signed)
Dear Dr. Elam Dutch, Here is my assessment for our mutual patient, Tara Conner. Thank you for allowing me the opportunity to care for your patient. Please do not hesitate to contact me should you have any other questions. Sincerely, Dr. Jovita Kussmaul  Otolaryngology Clinic Note Referring provider: Dr. Elam Dutch HPI:  Tara Conner is a 78 y.o. female kindly referred by Dr. Elam Dutch for evaluation of right ear fullness and discomfort.  Initial visit (04/12/2023): Patient reports: she had right ear pain and discomfort for about 1 week starting in August 2024. She reports that she had URI symptoms before pain. Went to PCP, She was diagnosed with AOM and prescribed doxycycline and claritin, and improved with the pain. Now having some right ear fullness and pressure and "being under water." Some intermittent non-pulsatile ringing. Hearing is down as well in that ear. Patient denies: ear pain, vertigo, drainage. Patient additionally denies: deep pain in ear canal, eustachian tube symptoms such as popping, crackling Patient also denies barotrauma, vestibular suppressant use, ototoxic medication use Prior ear surgery: no  No frequent ear infections. No sprays in the nose. No frequent sinus sinfections, nasal obstructions, neck masses, hemoptysis, weight loss, B symptoms  ----------------------------------------- Given unilateral effusion and likely recovery from AOM, we decided to do nasal sprays and observe rather than do myringotomy. She presents for follow up.  (05/11/2023): She reports that that she is doing much better now. Pain and discomfort have stayed away and reports that her fullness and pressure are also better. No ringing anymore. Also feels like her hearing is better, but not all the way back. No vertigo or drainage. She has been using flonase. --------------------------------------------------------- 07/10/2023 Unfortunately continues to fullness and pressure, on right. No  tinnitus, hearing improving; no vertigo or drainge. Continues to use flonase. We discussed R/B/A for tymp tube and she would like to proceed since she feels like "fluid is sloshing around in there" She was recently admitted for UGI bleed -----------------------------------------------------------  H&N Surgery: no Personal or FHx of bleeding dz or anesthesia difficulty: no  AP/AC: Plavix PMHx: Stroke, T2DM, HTN, CKD  Independent Review of Additional Tests or Records:  [facility] 2019: mastoids and middle ear well aerated MRI 2019: T2 without significant retrocochlear masses noted   04/12/2023 Audiogram was independently reviewed and interpreted by me and it reveals Right ear: mixed HL, moderate to moderately severe across frequencies. But does not appear significantly asymmetric; see audio note; type B tympanogram Left ear: moderate to mod-severe downsloping SNHL; type A tympanogram   SNHL= Sensorineural hearing loss  07/10/2023 Audiogram/Tymps was independently reviewed and interpreted by me and it reveals: AS type A; AD type B  SNHL= Sensorineural hearing loss    PMH/Meds/All/SocHx/FamHx/ROS:   Past Medical History:  Diagnosis Date   Diabetes mellitus without complication (HCC)    Diabetic retinopathy (HCC)    PDR OU   Hypertension    Hypertensive retinopathy    OU   Stage 3b chronic kidney disease (HCC) 07/10/2020   Stroke (HCC) 2019     Past Surgical History:  Procedure Laterality Date   CATARACT EXTRACTION Bilateral    COLONOSCOPY     [facility] possibly but not sure. [place] [facility] it was negative   ENTEROSCOPY N/A 06/20/2023   Procedure: ENTEROSCOPY;  Surgeon: Lynann Bologna, MD;  Location: [facility] ENDOSCOPY;  Service: Gastroenterology;  Laterality: N/A;   ESOPHAGOGASTRODUODENOSCOPY     around the same time said it was negative    EYE SURGERY Bilateral    Cat Sx OU  GALLBLADDER SURGERY     HEMOSTASIS CLIP PLACEMENT  06/20/2023   Procedure: HEMOSTASIS CLIP PLACEMENT;   Surgeon: Lynann Bologna, MD;  Location: [facility] ENDOSCOPY;  Service: Gastroenterology;;   HEMOSTASIS CONTROL  06/20/2023   Procedure: HEMOSTASIS CONTROL;  Surgeon: Lynann Bologna, MD;  Location: [facility] ENDOSCOPY;  Service: Gastroenterology;;    Family History  Problem Relation Age of Onset   Diabetes Mother    Breast cancer Sister    Diabetes Sister    Diabetes Daughter    Diabetes Brother    Colon cancer Neg Hx    Esophageal cancer Neg Hx    Rectal cancer Neg Hx    Stomach cancer Neg Hx      Social Connections: Socially Isolated (06/19/2023)   Social Connection and Isolation Panel [NHANES]    Frequency of Communication with Friends and Family: More than three times a week    Frequency of Social Gatherings with Friends and Family: Once a week    Attends Religious Services: Never    Database administrator or Organizations: No    Attends Banker Meetings: Never    Marital Status: Divorced      Current Outpatient Medications:    acetaminophen (TYLENOL) 500 MG tablet, Take 500 mg by mouth daily as needed for mild pain (pain score 1-3)., Disp: , Rfl:    amLODipine (NORVASC) 5 MG tablet, TAKE 1 TABLET(5 MG) BY MOUTH DAILY, Disp: 90 tablet, Rfl: 1   atorvastatin (LIPITOR) 40 MG tablet, TAKE 1 TABLET(40 MG) BY MOUTH DAILY, Disp: 100 tablet, Rfl: 2   Blood Glucose Monitoring Suppl (ONE TOUCH ULTRA 2) w/Device KIT, by Does not apply route., Disp: , Rfl:    Chlorphen-Phenyleph-ASA (ALKA-SELTZER PLUS COLD PO), Take 1 tablet by mouth at bedtime as needed (for indigestion)., Disp: , Rfl:    Cholecalciferol (VITAMIN D) 50 MCG (2000 UT) tablet, Take 1 tablet (2,000 Units total) by mouth daily., Disp: , Rfl:    cimetidine (TAGAMET) 300 MG tablet, TAKE 1 TABLET(300 MG) BY MOUTH AT BEDTIME (Patient taking differently: Take 300 mg by mouth at bedtime as needed.), Disp: 90 tablet, Rfl: 4   clopidogrel (PLAVIX) 75 MG tablet, Take 1 tablet (75 mg total) by mouth daily. TAKE 1 TABLET(75 MG) BY MOUTH  DAILY, Disp: , Rfl:    ferrous sulfate 325 (65 FE) MG tablet, Take 1 tablet (325 mg total) by mouth 2 (two) times daily with a meal. (Patient taking differently: Take 325 mg by mouth daily with breakfast.), Disp: 90 tablet, Rfl: 1   fluticasone (FLONASE) 50 MCG/ACT nasal spray, Place 2 sprays into both nostrils in the morning and at bedtime., Disp: 11 mL, Rfl: 6   gabapentin (NEURONTIN) 300 MG capsule, Take 1 capsule (300 mg total) by mouth 2 (two) times daily., Disp: 60 capsule, Rfl: 3   glucose blood (ONETOUCH ULTRA) test strip, TEST UPTO FOUR TIMES DAILY, Disp: 100 strip, Rfl: 3   Glycerin-Polysorbate 80 (REFRESH DRY EYE THERAPY OP), Place 1-2 drops into both eyes as needed (for dryness)., Disp: , Rfl:    insulin glargine (LANTUS SOLOSTAR) 100 UNIT/ML Solostar Pen, Inject 30 Units into the skin at bedtime., Disp: , Rfl:    insulin lispro (HUMALOG KWIKPEN) 100 UNIT/ML KwikPen, ADMINISTER 0 TO8UNITS UNDER THE SKIN INTO SKIN THREE TIMES DAILY, Disp: , Rfl:    Insulin Pen Needle (BD PEN NEEDLE NANO 2ND GEN) 32G X 4 MM MISC, 1 Device by Other route 4 (four) times daily. E11.40, Disp: 200 each,  Rfl: 1   Insulin Syringe-Needle U-100 (INSULIN SYRINGE 1CC/31GX5/16") 31G X 5/16" 1 ML MISC, USE AS DIRECTED WITH LANTUS, Disp: 100 each, Rfl: 5   Lancets (ONETOUCH ULTRASOFT) lancets, 1 each by Other route 4 (four) times daily. E11.40, Disp: 100 each, Rfl: 12   losartan (COZAAR) 25 MG tablet, TAKE 1 TABLET(25 MG) BY MOUTH DAILY, Disp: 90 tablet, Rfl: 1   Multiple Vitamins-Minerals (MULTIVITAMIN ADULT PO), Take 1 tablet by mouth daily., Disp: , Rfl:    NON FORMULARY, Omega XL supplement twice daily., Disp: , Rfl:    ofloxacin (FLOXIN) 0.3 % OTIC solution, Place 5 drops into the right ear 2 (two) times daily for 5 days., Disp: 5 mL, Rfl: 0   pantoprazole (PROTONIX) 40 MG tablet, Take 40 mg oral twice daily for 4 weeks, then daily after that., Disp: 120 tablet, Rfl: 0   polyethylene glycol powder (GLYCOLAX/MIRALAX)  17 GM/SCOOP powder, Take 17 g by mouth 2 (two) times daily as needed., Disp: 3350 g, Rfl: 1   Physical Exam:   BP 111/73 (BP Location: Right Arm, Patient Position: Sitting, Cuff Size: Normal)   Pulse 95   LMP  (LMP Unknown)   SpO2 95%   Salient findings:  CN II-XII intact Left: EAC clear and TM intact with well pneumatized middle ear space Right: EAC clear; TM with small amount of serous fluid; clear retraction over pars and posterosuperior quadrant with prominent malleolar ligament Weber 512: midline but continues to be inconsistently Rinne 512: AC > BC b/l Anterior rhinoscopy: Septum relatively midline; no masses appreciate on anterior rhino; Nasal endoscopy was indicated to better evaluate the nose and paranasal sinuses, given the patient's history and exam findings (unilateral effusion), and is detailed below. No lesions of oral cavity/oropharynx No obviously palpable neck masses/lymphadenopathy/thyromegaly No respiratory distress or stridor  Seprately Identifiable Procedures:  PROCEDURE: Bilateral Diagnostic Rigid Nasal Endoscopy Pre-procedure diagnosis: Concern for nasal mass given unilateral ME effusion Post-procedure diagnosis: same Indication: See pre-procedure diagnosis and physical exam above Complications: None apparent EBL: 0 mL Anesthesia: Lidocaine 4% and topical decongestant was topically sprayed in each nasal cavity  Description of Procedure:  Patient was identified. A rigid 30 degree endoscope was utilized to evaluate the sinonasal cavities, mucosa, sinus ostia and turbinates and septum.  Overall, signs of mucosal inflammation are not noted.  Also noted are no masses over either eustachian tube orifice.  No mucopurulence, polyps, or masses noted.   Right Middle meatus: clear Right SE Recess: clear Left MM: clear Left SE Recess: clear   Photodocumentation was obtained.  CPT CODE -- 91478 - Mod 25  Procedure details: Pre-procedure diagnosis: Right eustachian  tube dysfunction, right chronic otitis media with effusion Post-procedure diagnosis: same Indication: as above in diagnosis Procedure: Right myringotomy with tube placement - CPT [id number] Details: We had a long discussion about benefits and risks of Tympanostomy tube placement including pain, bleeding, infection, early or late extrusion, TM perforation, otorrhea, injury to middle or external ear structures, nature of tympanostomy tubes, cholesteatoma, hearing loss, among others. Consent was obtained prior to proceeding. Patient was placed semi recumbent on the exam chair. The right ear was addressed with binocular microscopy and any cerumen was cleaned from the ear canal. The tympanic membrane was visualized, with findings as above. A focal spot over the anterior-inferior TM was anesthetized with topical phenol. A myringotomy incision was then made radially. Serous effusion was encountered. The middle ear cleft was suctioned. The PE tube was placed and ciprodex drops were  applied. A cotton ball was placed at the meatus. Patient tolerated the procedure well  Impression & Plans:  Danel Studzinski is a 78 y.o. female with h/o HTN, T2DM, CKD, prior Stroke on Plavix now with:  1. Mixed conductive and sensorineural hearing loss, unilateral, right ear with restricted hearing on the contralateral side   2. Chronic otitis media of right ear with effusion   3. Allergic rhinitis due to other allergic trigger, unspecified seasonality   4. Dysfunction of right eustachian tube     She is now more than 12 weeks out, and still has type B tymps on flonase. Some retraction but also noted to have some fluid. Endoscopy without ET masses. We discussed options including observation v/s right tube. She opted for tube, which was placed. She reports improvement in her symptoms after.  - Continue flonase BID - Start ofloxacin drops BID x5d - f/u in 3-4 months with audio prior   See below regarding exact medications  prescribed this encounter including dosages and route: Meds ordered this encounter  Medications   ofloxacin (FLOXIN) 0.3 % OTIC solution    Sig: Place 5 drops into the right ear 2 (two) times daily for 5 days.    Dispense:  5 mL    Refill:  0      Thank you for allowing me the opportunity to care for your patient. Please do not hesitate to contact me should you have any other questions.  Sincerely, Jovita Kussmaul, MD Otolaryngologist (ENT), [facility] Phone: [phone] Fax: [phone]  07/11/2023, 7:32 AM   MDM:  Level 4: 99214 Complexity/Problems addressed: mod - multiple chronic problems Data complexity: low - Morbidity: mod - Prescription Drug prescribed or managed: yes

## 2023-07-10 NOTE — Progress Notes (Signed)
  [address] [place], [place] [address] [phone]  Audiological Evaluation    Name: Tara Conner     DOB:   [birth year]      MRN:   [id number]                                                                                     Service Date: 07/10/2023     Accompanied by: unaccompanied    Patient comes today after Dr. Lydia Sams, ENT sent a referral for a hearing evaluation due to concerns with right ear OM and mixed hearing loss.   Symptoms Yes Details  Hearing loss  [x]  04-12-2023: Right ear: Moderately-severe hearing loss at 250 Hz sloping to profound mixed hearing loss from [phone] Hz. Left ear: Mild sensorineural hearing loss from [phone] sloping to moderately-severe sensorineural hearing loss from 4000-8000 Hz.  Tinnitus  [x]  Some water like sensation in the right ear still perceived  Ear pain/ infections/pressure  []    Balance problems  []    Noise exposure history  []    Previous ear surgeries  []    Family history of hearing loss  []    Amplification  []    Other  []      Otoscopy:    Clear external ear canals, in both ears.    Tympanometry: Right ear: Type B- Normal external ear canal volume with no middle ear pressure peak or tympanic membrane compliance Left ear: Type A- Normal external ear canal volume with normal middle ear pressure and tympanic membrane compliance    Patient' hearing test was deferred after Dr. Lydia Sams also agreed to it given that patient's middle ear dysfunction has not resolved.    Recommendations: Follow up with ENT as scheduled for today. Repeat audiogram after medical care.   Kailey Esquilin MARIE LEROUX-MARTINEZ, AUD

## 2023-07-11 ENCOUNTER — Encounter: Payer: Self-pay | Admitting: Family

## 2023-07-11 ENCOUNTER — Encounter (INDEPENDENT_AMBULATORY_CARE_PROVIDER_SITE_OTHER): Payer: Self-pay

## 2023-07-11 MED ORDER — OFLOXACIN 0.3 % OT SOLN: 5.0000 [drp] | Freq: Two times a day (BID) | OTIC | 0 refills | Status: AC

## 2023-07-13 DIAGNOSIS — D649 Anemia, unspecified: Secondary | ICD-10-CM | POA: Diagnosis not present

## 2023-07-13 DIAGNOSIS — R7989 Other specified abnormal findings of blood chemistry: Secondary | ICD-10-CM | POA: Diagnosis not present

## 2023-07-13 DIAGNOSIS — Z8673 Personal history of transient ischemic attack (TIA), and cerebral infarction without residual deficits: Secondary | ICD-10-CM | POA: Diagnosis not present

## 2023-07-15 ENCOUNTER — Other Ambulatory Visit: Payer: Self-pay | Admitting: Family

## 2023-07-15 DIAGNOSIS — E7800 Pure hypercholesterolemia, unspecified: Secondary | ICD-10-CM

## 2023-07-19 DIAGNOSIS — R7989 Other specified abnormal findings of blood chemistry: Secondary | ICD-10-CM | POA: Diagnosis not present

## 2023-07-19 DIAGNOSIS — Z8673 Personal history of transient ischemic attack (TIA), and cerebral infarction without residual deficits: Secondary | ICD-10-CM | POA: Diagnosis not present

## 2023-07-19 DIAGNOSIS — D649 Anemia, unspecified: Secondary | ICD-10-CM | POA: Diagnosis not present

## 2023-07-21 DIAGNOSIS — R7989 Other specified abnormal findings of blood chemistry: Secondary | ICD-10-CM | POA: Diagnosis not present

## 2023-07-21 DIAGNOSIS — Z8673 Personal history of transient ischemic attack (TIA), and cerebral infarction without residual deficits: Secondary | ICD-10-CM | POA: Diagnosis not present

## 2023-07-21 DIAGNOSIS — D649 Anemia, unspecified: Secondary | ICD-10-CM | POA: Diagnosis not present

## 2023-07-26 DIAGNOSIS — Z8673 Personal history of transient ischemic attack (TIA), and cerebral infarction without residual deficits: Secondary | ICD-10-CM | POA: Diagnosis not present

## 2023-07-26 DIAGNOSIS — D649 Anemia, unspecified: Secondary | ICD-10-CM | POA: Diagnosis not present

## 2023-07-26 DIAGNOSIS — R7989 Other specified abnormal findings of blood chemistry: Secondary | ICD-10-CM | POA: Diagnosis not present

## 2023-08-02 DIAGNOSIS — E1122 Type 2 diabetes mellitus with diabetic chronic kidney disease: Secondary | ICD-10-CM | POA: Diagnosis not present

## 2023-08-02 DIAGNOSIS — Z794 Long term (current) use of insulin: Secondary | ICD-10-CM | POA: Diagnosis not present

## 2023-08-02 DIAGNOSIS — K922 Gastrointestinal hemorrhage, unspecified: Secondary | ICD-10-CM | POA: Diagnosis not present

## 2023-08-02 DIAGNOSIS — D500 Iron deficiency anemia secondary to blood loss (chronic): Secondary | ICD-10-CM | POA: Diagnosis not present

## 2023-08-02 DIAGNOSIS — I700 Atherosclerosis of aorta: Secondary | ICD-10-CM | POA: Diagnosis not present

## 2023-08-02 DIAGNOSIS — I129 Hypertensive chronic kidney disease with stage 1 through stage 4 chronic kidney disease, or unspecified chronic kidney disease: Secondary | ICD-10-CM | POA: Diagnosis not present

## 2023-08-02 DIAGNOSIS — E1140 Type 2 diabetes mellitus with diabetic neuropathy, unspecified: Secondary | ICD-10-CM | POA: Diagnosis not present

## 2023-08-02 DIAGNOSIS — D631 Anemia in chronic kidney disease: Secondary | ICD-10-CM | POA: Diagnosis not present

## 2023-08-02 DIAGNOSIS — N1832 Chronic kidney disease, stage 3b: Secondary | ICD-10-CM | POA: Diagnosis not present

## 2023-08-02 DIAGNOSIS — E113593 Type 2 diabetes mellitus with proliferative diabetic retinopathy without macular edema, bilateral: Secondary | ICD-10-CM | POA: Diagnosis not present

## 2023-08-11 DIAGNOSIS — N1832 Chronic kidney disease, stage 3b: Secondary | ICD-10-CM | POA: Diagnosis not present

## 2023-08-11 DIAGNOSIS — I700 Atherosclerosis of aorta: Secondary | ICD-10-CM | POA: Diagnosis not present

## 2023-08-11 DIAGNOSIS — Z794 Long term (current) use of insulin: Secondary | ICD-10-CM | POA: Diagnosis not present

## 2023-08-11 DIAGNOSIS — D500 Iron deficiency anemia secondary to blood loss (chronic): Secondary | ICD-10-CM | POA: Diagnosis not present

## 2023-08-11 DIAGNOSIS — I129 Hypertensive chronic kidney disease with stage 1 through stage 4 chronic kidney disease, or unspecified chronic kidney disease: Secondary | ICD-10-CM | POA: Diagnosis not present

## 2023-08-11 DIAGNOSIS — K922 Gastrointestinal hemorrhage, unspecified: Secondary | ICD-10-CM | POA: Diagnosis not present

## 2023-08-11 DIAGNOSIS — E113593 Type 2 diabetes mellitus with proliferative diabetic retinopathy without macular edema, bilateral: Secondary | ICD-10-CM | POA: Diagnosis not present

## 2023-08-11 DIAGNOSIS — E1140 Type 2 diabetes mellitus with diabetic neuropathy, unspecified: Secondary | ICD-10-CM | POA: Diagnosis not present

## 2023-08-11 DIAGNOSIS — D631 Anemia in chronic kidney disease: Secondary | ICD-10-CM | POA: Diagnosis not present

## 2023-08-11 DIAGNOSIS — E1122 Type 2 diabetes mellitus with diabetic chronic kidney disease: Secondary | ICD-10-CM | POA: Diagnosis not present

## 2023-08-15 DIAGNOSIS — I129 Hypertensive chronic kidney disease with stage 1 through stage 4 chronic kidney disease, or unspecified chronic kidney disease: Secondary | ICD-10-CM | POA: Diagnosis not present

## 2023-08-15 DIAGNOSIS — I700 Atherosclerosis of aorta: Secondary | ICD-10-CM | POA: Diagnosis not present

## 2023-08-15 DIAGNOSIS — D631 Anemia in chronic kidney disease: Secondary | ICD-10-CM | POA: Diagnosis not present

## 2023-08-15 DIAGNOSIS — E1122 Type 2 diabetes mellitus with diabetic chronic kidney disease: Secondary | ICD-10-CM | POA: Diagnosis not present

## 2023-08-15 DIAGNOSIS — D500 Iron deficiency anemia secondary to blood loss (chronic): Secondary | ICD-10-CM | POA: Diagnosis not present

## 2023-08-15 DIAGNOSIS — K922 Gastrointestinal hemorrhage, unspecified: Secondary | ICD-10-CM | POA: Diagnosis not present

## 2023-08-15 DIAGNOSIS — E1140 Type 2 diabetes mellitus with diabetic neuropathy, unspecified: Secondary | ICD-10-CM | POA: Diagnosis not present

## 2023-08-15 DIAGNOSIS — Z794 Long term (current) use of insulin: Secondary | ICD-10-CM | POA: Diagnosis not present

## 2023-08-15 DIAGNOSIS — N1832 Chronic kidney disease, stage 3b: Secondary | ICD-10-CM | POA: Diagnosis not present

## 2023-08-15 DIAGNOSIS — E113593 Type 2 diabetes mellitus with proliferative diabetic retinopathy without macular edema, bilateral: Secondary | ICD-10-CM | POA: Diagnosis not present

## 2023-08-24 DIAGNOSIS — K219 Gastro-esophageal reflux disease without esophagitis: Secondary | ICD-10-CM | POA: Diagnosis not present

## 2023-08-24 DIAGNOSIS — N1832 Chronic kidney disease, stage 3b: Secondary | ICD-10-CM | POA: Diagnosis not present

## 2023-08-24 DIAGNOSIS — I129 Hypertensive chronic kidney disease with stage 1 through stage 4 chronic kidney disease, or unspecified chronic kidney disease: Secondary | ICD-10-CM | POA: Diagnosis not present

## 2023-08-24 DIAGNOSIS — D500 Iron deficiency anemia secondary to blood loss (chronic): Secondary | ICD-10-CM | POA: Diagnosis not present

## 2023-08-24 DIAGNOSIS — E113593 Type 2 diabetes mellitus with proliferative diabetic retinopathy without macular edema, bilateral: Secondary | ICD-10-CM | POA: Diagnosis not present

## 2023-08-24 DIAGNOSIS — K922 Gastrointestinal hemorrhage, unspecified: Secondary | ICD-10-CM | POA: Diagnosis not present

## 2023-08-24 DIAGNOSIS — E1140 Type 2 diabetes mellitus with diabetic neuropathy, unspecified: Secondary | ICD-10-CM | POA: Diagnosis not present

## 2023-08-24 DIAGNOSIS — D531 Other megaloblastic anemias, not elsewhere classified: Secondary | ICD-10-CM | POA: Diagnosis not present

## 2023-08-24 DIAGNOSIS — Z79899 Other long term (current) drug therapy: Secondary | ICD-10-CM

## 2023-08-24 DIAGNOSIS — E7800 Pure hypercholesterolemia, unspecified: Secondary | ICD-10-CM

## 2023-08-24 DIAGNOSIS — Z7902 Long term (current) use of antithrombotics/antiplatelets: Secondary | ICD-10-CM

## 2023-08-24 DIAGNOSIS — Z87440 Personal history of urinary (tract) infections: Secondary | ICD-10-CM

## 2023-08-24 DIAGNOSIS — E1122 Type 2 diabetes mellitus with diabetic chronic kidney disease: Secondary | ICD-10-CM | POA: Diagnosis not present

## 2023-08-24 DIAGNOSIS — Z8673 Personal history of transient ischemic attack (TIA), and cerebral infarction without residual deficits: Secondary | ICD-10-CM

## 2023-08-24 DIAGNOSIS — I700 Atherosclerosis of aorta: Secondary | ICD-10-CM | POA: Diagnosis not present

## 2023-08-24 DIAGNOSIS — Z794 Long term (current) use of insulin: Secondary | ICD-10-CM | POA: Diagnosis not present

## 2023-08-24 DIAGNOSIS — H35039 Hypertensive retinopathy, unspecified eye: Secondary | ICD-10-CM | POA: Diagnosis not present

## 2023-08-24 DIAGNOSIS — Z556 Problems related to health literacy: Secondary | ICD-10-CM

## 2023-09-06 ENCOUNTER — Other Ambulatory Visit: Payer: Self-pay | Admitting: Family

## 2023-09-18 ENCOUNTER — Other Ambulatory Visit: Payer: 59

## 2023-09-18 ENCOUNTER — Ambulatory Visit (INDEPENDENT_AMBULATORY_CARE_PROVIDER_SITE_OTHER): Payer: 59 | Admitting: Podiatry

## 2023-09-18 DIAGNOSIS — E1140 Type 2 diabetes mellitus with diabetic neuropathy, unspecified: Secondary | ICD-10-CM | POA: Diagnosis not present

## 2023-09-18 DIAGNOSIS — E785 Hyperlipidemia, unspecified: Secondary | ICD-10-CM | POA: Diagnosis not present

## 2023-09-18 DIAGNOSIS — Z91199 Patient's noncompliance with other medical treatment and regimen due to unspecified reason: Secondary | ICD-10-CM

## 2023-09-18 DIAGNOSIS — N1832 Chronic kidney disease, stage 3b: Secondary | ICD-10-CM

## 2023-09-18 DIAGNOSIS — D509 Iron deficiency anemia, unspecified: Secondary | ICD-10-CM | POA: Diagnosis not present

## 2023-09-18 DIAGNOSIS — I10 Essential (primary) hypertension: Secondary | ICD-10-CM | POA: Diagnosis not present

## 2023-09-19 LAB — CBC WITH DIFFERENTIAL/PLATELET
Absolute Lymphocytes: 1606 {cells}/uL (ref 850–3900)
Absolute Monocytes: 490 {cells}/uL (ref 200–950)
Basophils Absolute: 61 {cells}/uL (ref 0–200)
Basophils Relative: 1.1 %
Eosinophils Absolute: 138 {cells}/uL (ref 15–500)
Eosinophils Relative: 2.5 %
HCT: 31.2 % — ABNORMAL LOW (ref 35.0–45.0)
Hemoglobin: 10.1 g/dL — ABNORMAL LOW (ref 11.7–15.5)
MCH: 27.7 pg (ref 27.0–33.0)
MCHC: 32.4 g/dL (ref 32.0–36.0)
MCV: 85.7 fL (ref 80.0–100.0)
MPV: 9.7 fL (ref 7.5–12.5)
Monocytes Relative: 8.9 %
Neutro Abs: 3207 {cells}/uL (ref 1500–7800)
Neutrophils Relative %: 58.3 %
Platelets: 403 10*3/uL — ABNORMAL HIGH (ref 140–400)
RBC: 3.64 10*6/uL — ABNORMAL LOW (ref 3.80–5.10)
RDW: 13.1 % (ref 11.0–15.0)
Total Lymphocyte: 29.2 %
WBC: 5.5 10*3/uL (ref 3.8–10.8)

## 2023-09-19 LAB — HEMOGLOBIN A1C
Hgb A1c MFr Bld: 8.8 % — ABNORMAL HIGH (ref <5.7)
Mean Plasma Glucose: 206 mg/dL
eAG (mmol/L): 11.4 mmol/L

## 2023-09-19 LAB — COMPLETE METABOLIC PANEL WITHOUT GFR
AG Ratio: 1 (calc) (ref 1.0–2.5)
ALT: 13 U/L (ref 6–29)
AST: 17 U/L (ref 10–35)
Albumin: 3.8 g/dL (ref 3.6–5.1)
Alkaline phosphatase (APISO): 87 U/L (ref 37–153)
BUN/Creatinine Ratio: 15 (calc) (ref 6–22)
BUN: 18 mg/dL (ref 7–25)
CO2: 24 mmol/L (ref 20–32)
Calcium: 9.2 mg/dL (ref 8.6–10.4)
Chloride: 104 mmol/L (ref 98–110)
Creat: 1.2 mg/dL — ABNORMAL HIGH (ref 0.60–1.00)
Globulin: 3.7 g/dL (ref 1.9–3.7)
Glucose, Bld: 142 mg/dL — ABNORMAL HIGH (ref 65–99)
Potassium: 4.4 mmol/L (ref 3.5–5.3)
Sodium: 137 mmol/L (ref 135–146)
Total Bilirubin: 0.3 mg/dL (ref 0.2–1.2)
Total Protein: 7.5 g/dL (ref 6.1–8.1)

## 2023-09-19 LAB — LIPID PANEL
Cholesterol: 125 mg/dL (ref <200)
HDL: 44 mg/dL — ABNORMAL LOW (ref >=50)
LDL Cholesterol (Calc): 65 mg/dL
Non-HDL Cholesterol (Calc): 81 mg/dL (ref <130)
Total CHOL/HDL Ratio: 2.8 (calc) (ref <5.0)
Triglycerides: 75 mg/dL (ref <150)

## 2023-09-19 LAB — TSH: TSH: 0.63 m[IU]/L (ref 0.40–4.50)

## 2023-09-20 ENCOUNTER — Ambulatory Visit (INDEPENDENT_AMBULATORY_CARE_PROVIDER_SITE_OTHER): Payer: 59 | Admitting: Family

## 2023-09-20 ENCOUNTER — Encounter: Payer: Self-pay | Admitting: Family

## 2023-09-20 VITALS — BP 110/64 | HR 92 | Temp 97.5°F | Resp 20 | Ht 67.0 in | Wt 202.4 lb

## 2023-09-20 DIAGNOSIS — E2839 Other primary ovarian failure: Secondary | ICD-10-CM

## 2023-09-20 DIAGNOSIS — E785 Hyperlipidemia, unspecified: Secondary | ICD-10-CM | POA: Diagnosis not present

## 2023-09-20 DIAGNOSIS — I10 Essential (primary) hypertension: Secondary | ICD-10-CM

## 2023-09-20 DIAGNOSIS — Z1231 Encounter for screening mammogram for malignant neoplasm of breast: Secondary | ICD-10-CM | POA: Diagnosis not present

## 2023-09-20 DIAGNOSIS — N1832 Chronic kidney disease, stage 3b: Secondary | ICD-10-CM

## 2023-09-20 DIAGNOSIS — K219 Gastro-esophageal reflux disease without esophagitis: Secondary | ICD-10-CM

## 2023-09-20 DIAGNOSIS — E1140 Type 2 diabetes mellitus with diabetic neuropathy, unspecified: Secondary | ICD-10-CM | POA: Diagnosis not present

## 2023-09-20 DIAGNOSIS — Z794 Long term (current) use of insulin: Secondary | ICD-10-CM

## 2023-09-20 MED ORDER — FREESTYLE LIBRE 3 SENSOR MISC: 1.0000 | Freq: Four times a day (QID) | 5 refills | Status: DC

## 2023-09-20 MED ORDER — FREESTYLE LIBRE 3 READER DEVI: 1.0000 | Freq: Four times a day (QID) | 5 refills | Status: DC

## 2023-09-20 NOTE — Progress Notes (Signed)
 Provider: Christean Courts FNP-C   Trystyn Dolley, Elijio Guadeloupe, NP  Patient Care Team: Neda Willenbring, Elijio Guadeloupe, NP as PCP - General (Family Medicine) [facility]  Extended Emergency Contact Information Primary Emergency Contact: [facility] Mobile Phone: [phone] Relation: Daughter Secondary Emergency Contact: Wilson,Rachel Mobile Phone: [phone] Relation: Daughter  Code Status:  Full Code  Goals of care: Advanced Directive information    06/29/2023   10:06 AM  Advanced Directives  Does Patient Have a Medical Advance Directive? No  Does patient want to make changes to medical advance directive? No - Patient declined  Would patient like information on creating a medical advance directive? No - Patient declined     Chief Complaint  Patient presents with   Medical Management of Chronic Issues   Discussed the use of AI scribe software for clinical note transcription with the patient, who gave verbal consent to proceed.  History of Present Illness   Tara Conner is a 78 year old female with diabetes and hyperlipidemia who presents for a six-month follow-up.  She is here for a six-month follow-up primarily due to concerns about her diabetes management and recent lab results. Her A1c had increased significantly, prompting an earlier follow-up than usual. Recent blood work shows improvement in her glucose levels, with a decrease from 189 to 142, and her A1c has decreased from 10.1 to 8.8. She has started exercising at [employer], going two to three times a week, which she believes is helping her manage her blood sugar levels. She is currently taking Lantus  30 units at bedtime and Humalog  10 units, which she increased from 8 units. Her blood sugar levels are generally in the 120s in the morning, though they can rise above 200 in the evening, particularly after supper.  Her cholesterol levels have improved, with total cholesterol decreasing from 135 to 125,  triglycerides from 101 to 75, and LDL from 72 to 65. However, her HDL remains low. She has started exercising more regularly, which she hopes will help.  She takes iron  supplements twice daily for anemia, which has shown improvement with hemoglobin levels rising from 9.3 to 10.1. No blood in her stool.  She experiences tingling in her feet due to neuropathy and is currently on gabapentin  300 mg twice daily for symptom management. She also takes Tylenol  as needed for arthritis pain in her fingers, which limits her ability to close them fully.  Her diet includes diet sodas, diet green tea, and she is working on reducing bread intake, which she finds challenging. She monitors her intake of sugary foods and carbohydrates, including bread and potatoes. She takes a multivitamin, vitamin D , Plavix , and cimetidine  as needed for stomach issues. She no longer uses Flonase .    Past Medical History:  Diagnosis Date   Diabetes mellitus without complication (HCC)    Diabetic retinopathy (HCC)    PDR OU   Hypertension    Hypertensive retinopathy    OU   Stage 3b chronic kidney disease (HCC) 07/10/2020   Stroke (HCC) 2019   Past Surgical History:  Procedure Laterality Date   CATARACT EXTRACTION Bilateral    COLONOSCOPY     [facility] possibly but not sure. [name] it was negative   ENTEROSCOPY N/A 06/20/2023   Procedure: ENTEROSCOPY;  Surgeon: Lajuan Pila, MD;  Location: [facility] ENDOSCOPY;  Service: Gastroenterology;  Laterality: N/A;   ESOPHAGOGASTRODUODENOSCOPY     around the same time said it was negative    EYE SURGERY Bilateral    Cat  Sx OU   GALLBLADDER SURGERY     HEMOSTASIS CLIP PLACEMENT  06/20/2023   Procedure: HEMOSTASIS CLIP PLACEMENT;  Surgeon: Lajuan Pila, MD;  Location[facility] ENDOSCOPY;  Service: Gastroenterology;;   HEMOSTASIS CONTROL  06/20/2023   Procedure: HEMOSTASIS CONTROL;  Surgeon: Lajuan Pila, MD;  Location: [facility] ENDOSCOPY;  Service: Gastroenterology;;    Allergies   Allergen Reactions   Penicillins     Said she was young had a reaction    Sulfa  Antibiotics Other (See Comments)    Pt unable to report reaction Said she had a rash    Allergies as of 09/20/2023       Reactions   Penicillins    [name] she was young had a reaction    Sulfa  Antibiotics Other (See Comments)   Pt unable to report reaction Said she had a rash        Medication List        Accurate as of September 20, 2023 10:25 PM. If you have any questions, ask your nurse or doctor.          STOP taking these medications    fluticasone  50 MCG/ACT nasal spray Commonly known as: FLONASE  Stopped by: Rayetta Veith C Xane Amsden       TAKE these medications    acetaminophen  500 MG tablet Commonly known as: TYLENOL  Take 500 mg by mouth daily as needed for mild pain (pain score 1-3).   ALKA-SELTZER PLUS COLD PO Take 1 tablet by mouth at bedtime as needed (for indigestion).   amLODipine  5 MG tablet Commonly known as: NORVASC  TAKE 1 TABLET(5 MG) BY MOUTH DAILY   atorvastatin  40 MG tablet Commonly known as: LIPITOR TAKE 1 TABLET(40 MG) BY MOUTH DAILY   BD Pen Needle Nano 2nd Gen 32G X 4 MM Misc Generic drug: Insulin  Pen Needle 1 Device by Other route 4 (four) times daily. E11.40   cimetidine  300 MG tablet Commonly known as: TAGAMET  TAKE 1 TABLET(300 MG) BY MOUTH AT BEDTIME What changed: See the new instructions.   clopidogrel  75 MG tablet Commonly known as: PLAVIX  Take 1 tablet (75 mg total) by mouth daily. TAKE 1 TABLET(75 MG) BY MOUTH DAILY   ferrous sulfate  325 (65 FE) MG tablet Take 1 tablet (325 mg total) by mouth 2 (two) times daily with a meal. What changed: when to take this   FreeStyle Libre 3 Reader Devi 1 Device by Does not apply route in the morning, at noon, in the evening, and at bedtime. Started by: Vanity Larsson C Taraneh Metheney   FreeStyle Libre 3 Sensor Misc 1 Device by Does not apply route in the morning, at noon, in the evening, and at bedtime. Started by: Heywood Tokunaga C  Nico Syme   gabapentin  300 MG capsule Commonly known as: NEURONTIN  Take 1 capsule (300 mg total) by mouth 2 (two) times daily.   insulin  lispro 100 UNIT/ML KwikPen Commonly known as: HumaLOG  KwikPen ADMINISTER 0 TO8UNITS UNDER THE SKIN INTO SKIN THREE TIMES DAILY What changed:  how much to take additional instructions   INSULIN  SYRINGE 1CC/31GX5/16" 31G X 5/16" 1 ML Misc USE AS DIRECTED WITH LANTUS    Lantus  SoloStar 100 UNIT/ML Solostar Pen Generic drug: insulin  glargine Inject 30 Units into the skin at bedtime.   losartan  25 MG tablet Commonly known as: COZAAR  TAKE 1 TABLET(25 MG) BY MOUTH DAILY   MULTIVITAMIN ADULT PO Take 1 tablet by mouth daily.   NON FORMULARY Omega XL supplement twice daily.   ONE TOUCH ULTRA 2 w/Device Kit by  Does not apply route.   OneTouch Ultra test strip Generic drug: glucose blood TEST UPTO FOUR TIMES DAILY   onetouch ultrasoft lancets 1 each by Other route 4 (four) times daily. E11.40   pantoprazole  40 MG tablet Commonly known as: Protonix  Take 40 mg oral twice daily for 4 weeks, then daily after that. What changed:  how much to take when to take this additional instructions   polyethylene glycol powder 17 GM/SCOOP powder Commonly known as: GLYCOLAX /MIRALAX  Take 17 g by mouth 2 (two) times daily as needed.   REFRESH DRY EYE THERAPY OP Place 1-2 drops into both eyes as needed (for dryness).   Vitamin D  50 MCG (2000 UT) tablet Take 1 tablet (2,000 Units total) by mouth daily.        Review of Systems  Constitutional:  Negative for appetite change, chills, fatigue, fever and unexpected weight change.  HENT:  Negative for congestion, dental problem, ear discharge, ear pain, facial swelling, hearing loss, nosebleeds, postnasal drip, rhinorrhea, sinus pressure, sinus pain, sneezing, sore throat, tinnitus and trouble swallowing.   Eyes:  Negative for pain, discharge, redness, itching and visual disturbance.  Respiratory:  Negative  for cough, chest tightness, shortness of breath and wheezing.   Cardiovascular:  Negative for chest pain, palpitations and leg swelling.  Gastrointestinal:  Negative for abdominal distention, abdominal pain, blood in stool, constipation, diarrhea, nausea and vomiting.  Endocrine: Negative for cold intolerance, heat intolerance, polydipsia, polyphagia and polyuria.  Genitourinary:  Negative for difficulty urinating, dysuria, flank pain, frequency and urgency.  Musculoskeletal:  Positive for arthralgias and gait problem. Negative for back pain, joint swelling, myalgias, neck pain and neck stiffness.  Skin:  Negative for color change, pallor, rash and wound.  Neurological:  Negative for dizziness, syncope, speech difficulty, weakness, light-headedness, numbness and headaches.  Hematological:  Does not bruise/bleed easily.  Psychiatric/Behavioral:  Negative for agitation, behavioral problems, confusion, hallucinations, self-injury, sleep disturbance and suicidal ideas. The patient is not nervous/anxious.     Immunization History  Administered Date(s) Administered   Influenza, High Dose Seasonal PF 03/07/2018   Influenza,inj,Quad PF,6+ Mos 01/31/2019   PFIZER(Purple Top)SARS-COV-2 Vaccination 10/03/2019, 10/29/2019   Pertinent  Health Maintenance Due  Topic Date Due   DEXA SCAN  Never done   FOOT EXAM  10/27/2022   MAMMOGRAM  08/19/2023   HEMOGLOBIN A1C  03/19/2024   OPHTHALMOLOGY EXAM  07/05/2024   INFLUENZA VACCINE  Discontinued   Colonoscopy  Discontinued      01/27/2023    1:00 PM 02/22/2023   10:46 AM 05/16/2023    2:45 PM 06/14/2023   11:18 AM 06/29/2023   10:05 AM  Fall Risk  Falls in the past year? 0 0 0 0 0  Was there an injury with Fall?   0 0 0  Fall Risk Category Calculator   0 0 0   Functional Status Survey:    Vitals:   09/20/23 0913  BP: 110/64  Pulse: 92  Resp: 20  Temp: (!) 97.5 F (36.4 C)  SpO2: 98%  Weight: 202 lb 6.4 oz (91.8 kg)  Height: 5\' 7"  (1.702  m)   Body mass index is 31.7 kg/m. Physical Exam  GENERAL: Alert, cooperative, well developed, no acute distress HEENT: Normocephalic, normal oropharynx, moist mucous membranes, ears normal, nose normal, eyes normal, no sinus tenderness NECK: Neck normal CHEST: Clear to auscultation bilaterally, no wheezes, rhonchi, or crackles CARDIOVASCULAR: Normal heart rate and rhythm, S1 and S2 normal without murmurs ABDOMEN: Soft, non-tender, non-distended,  without organomegaly, normal bowel sounds EXTREMITIES: No cyanosis or edema, extremities normal NEUROLOGICAL: Cranial nerves grossly intact, moves all extremities without gross motor or sensory deficit, sensation intact bilaterally SKIN: Skin normal  SKIN: No rash,no lesion or erythema   PSYCHIATRY/BEHAVIORAL: Mood stable    Labs reviewed: Recent Labs    06/21/23 0444 06/22/23 0458 06/23/23 0528 06/29/23 1134 09/18/23 1007  NA 140 134* 138 138 137  K 3.6 3.6 3.8 4.5 4.4  CL 108 103 107 106 104  CO2 23 21* 23 23 24   GLUCOSE 107* 165* 170* 189* 142*  BUN 24* 24* 23 17 18   CREATININE 1.14* 1.31* 1.16* 1.30* 1.20*  CALCIUM  8.9 8.3* 8.7* 9.2 9.2  MG 1.7 1.7 1.7  --   --   PHOS 4.6 4.6 4.6  --   --    Recent Labs    06/07/23 1042 06/18/23 2343 09/18/23 1007  AST 17 20 17   ALT 9 16 13   ALKPHOS  --  54  --   BILITOT 0.3 0.4 0.3  PROT 7.6 6.2* 7.5  ALBUMIN  --  3.0*  --    Recent Labs    06/23/23 0528 06/29/23 1134 09/18/23 1007  WBC 7.1 7.4 5.5  NEUTROABS 4.2 4,928 3,207  HGB 8.3* 9.3* 10.1*  HCT 24.8* 28.8* 31.2*  MCV 85.8 87.8 85.7  PLT 304 503* 403*   Lab Results  Component Value Date   TSH 0.63 09/18/2023   Lab Results  Component Value Date   HGBA1C 8.8 (H) 09/18/2023   Lab Results  Component Value Date   CHOL 125 09/18/2023   HDL 44 (L) 09/18/2023   LDLCALC 65 09/18/2023   LDLDIRECT 54.0 08/10/2021   TRIG 75 09/18/2023   CHOLHDL 2.8 09/18/2023    Significant Diagnostic Results in last 30 days:  No  results found.  Assessment/Plan  Type 2 Diabetes Mellitus with Hyperglycemia Chronic condition with recent improvement in glucose control. A1c decreased from 10.1% to 8.8%. Blood glucose levels have improved, with morning readings around 120 mg/dL. She is engaging in exercise and dietary modifications to improve glucose control, focusing on reducing carbohydrate intake, particularly bread. - Continue Lantus  30 units at bedtime. - Continue Humalog  10 units as needed. - Encourage continued exercise and dietary modifications to further reduce A1c. - Request Freestyle Libre for continuous glucose monitoring. - Recheck labs in 6 months.  Peripheral Neuropathy Chronic condition affecting the feet, with symptoms of tingling and occasional numbness. No pain reported. Regular foot checks are advised to prevent complications. - Continue gabapentin  300 mg twice daily. - Encourage regular foot checks and podiatry visits as needed.  Hyperlipidemia Improved lipid profile with total cholesterol decreased to 125 mg/dL, triglycerides to 75 mg/dL, and LDL to 65 mg/dL. HDL remains low, but she has started exercising to improve it. - Encourage continued exercise to increase HDL levels.  Iron  Deficiency Anemia Chronic condition with improvement in hemoglobin levels from 9.3 g/dL to 16.1 g/dL. She is taking iron  supplements and monitoring for signs of gastrointestinal bleeding. - Continue ferrous sulfate  twice daily. - Monitor for signs of gastrointestinal bleeding.  Gastroesophageal Reflux Disease (GERD) Chronic condition managed with cimetidine  as needed. No current issues with indigestion reported. - Continue cimetidine  at bedtime as needed.  Osteoarthritis of Hand Chronic condition affecting the hands, with limited ability to close fingers completely. She uses Tylenol  for pain management and is encouraged to perform hand exercises. - Encourage hand exercises using squeeze balls. - Continue Tylenol  as  needed for pain management.  General Health Maintenance She is due for routine screenings and vaccinations. Declined shingles vaccine. Mammogram and bone density screening are due. - Order mammogram and bone density screening.  Follow-up She requires follow-up for ongoing management of chronic conditions and routine screenings. - Schedule follow-up appointment in 6 months. - Arrange Medicare visit, potentially via video call.   Family/ staff Communication: Reviewed plan of care with patient verbalized understanding   Labs/tests ordered:  - CBC with Differential/Platelet - CMP with eGFR([facility]) - TSH - Hgb A1C - Lipid panel - Bone density  - Bilateral Mammogram    Next Appointment : Return in about 6 months (around 03/21/2024) for medical mangement of chronic issues. Fasting labs in 6 months prior to visit, Annual wellness visi.Aaron Aas   Spent 30 minutes of Face to face and non-face to face with patient  >50% time spent counseling; reviewing medical record; tests; labs; documentation and developing future plan of care.   Estil Heman, NP

## 2023-09-26 NOTE — Progress Notes (Signed)
   Complete physical exam  Patient: Tara Conner   DOB: 03/19/1998   78 y.o. Female  MRN: [id number]  Subjective:    No chief complaint on file.   Tara Conner is a 78 y.o. female who presents today for a complete physical exam. She reports consuming a {diet types:17450} diet. {types:19826} She generally feels {DESC; WELL/FAIRLY WELL/POORLY:18703}. She reports sleeping {DESC; WELL/FAIRLY WELL/POORLY:18703}. She {does/does not:200015} have additional problems to discuss today.    Most recent fall risk assessment:    11/24/2021   10:42 AM  Fall Risk   Falls in the past year? 0  Number falls in past yr: 0  Injury with Fall? 0  Risk for fall due to : No Fall Risks  Follow up Falls evaluation completed     Most recent depression screenings:    11/24/2021   10:42 AM 10/15/2020   10:46 AM  PHQ 2/9 Scores  PHQ - 2 Score 0 0  PHQ- 9 Score 5     {VISON DENTAL STD PSA (Optional):27386}  {History (Optional):23778}  Patient Care Team: Christen Butter, NP as PCP - General (Nurse Practitioner)   Outpatient Medications Prior to Visit  Medication Sig   fluticasone (FLONASE) 50 MCG/ACT nasal spray Place 2 sprays into both nostrils in the morning and at bedtime. After 7 days, reduce to once daily.   norgestimate-ethinyl estradiol (SPRINTEC 28) 0.25-35 MG-MCG tablet Take 1 tablet by mouth daily.   Nystatin POWD Apply liberally to affected area 2 times per day   spironolactone (ALDACTONE) 100 MG tablet Take 1 tablet (100 mg total) by mouth daily.   No facility-administered medications prior to visit.    ROS        Objective:     There were no vitals taken for this visit. {Vitals History (Optional):23777}  Physical Exam   No results found for any visits on 12/30/21. {Show previous labs (optional):23779}    Assessment & Plan:    Routine Health Maintenance and Physical Exam  Immunization History  Administered Date(s) Administered   DTaP 06/02/1999, 07/29/1999,  10/07/1999, 06/22/2000, 01/06/2004   Hepatitis A 11/02/2007, 11/07/2008   Hepatitis B 03/20/1999, 04/27/1999, 10/07/1999   HiB (PRP-OMP) 06/02/1999, 07/29/1999, 10/07/1999, 06/22/2000   IPV 06/02/1999, 07/29/1999, 03/27/2000, 01/06/2004   Influenza,inj,Quad PF,6+ Mos 02/07/2014   Influenza-Unspecified 05/09/2012   MMR 03/27/2001, 01/06/2004   Meningococcal Polysaccharide 11/07/2011   Pneumococcal Conjugate-13 06/22/2000   Pneumococcal-Unspecified 10/07/1999, 12/21/1999   Tdap 11/07/2011   Varicella 03/27/2000, 11/02/2007    Health Maintenance  Topic Date Due   HIV Screening  Never done   Hepatitis C Screening  Never done   INFLUENZA VACCINE  12/28/2021   PAP-Cervical Cytology Screening  12/30/2021 (Originally 03/18/2020)   PAP SMEAR-Modifier  12/30/2021 (Originally 03/18/2020)   TETANUS/TDAP  12/30/2021 (Originally 11/06/2021)   HPV VACCINES  Discontinued   COVID-19 Vaccine  Discontinued    Discussed health benefits of physical activity, and encouraged her to engage in regular exercise appropriate for her age and condition.  Problem List Items Addressed This Visit   None Visit Diagnoses     Annual physical exam    -  Primary   Cervical cancer screening       Need for Tdap vaccination          No follow-ups on file.     Christen Butter, NP

## 2023-10-06 ENCOUNTER — Ambulatory Visit
Admission: RE | Admit: 2023-10-06 | Discharge: 2023-10-06 | Disposition: A | Discharge status: Home / Self Care | Source: Ambulatory Visit | Attending: Family | Admitting: Family

## 2023-10-06 ENCOUNTER — Ambulatory Visit (INDEPENDENT_AMBULATORY_CARE_PROVIDER_SITE_OTHER): Payer: 59 | Admitting: Audiology

## 2023-10-06 ENCOUNTER — Ambulatory Visit (INDEPENDENT_AMBULATORY_CARE_PROVIDER_SITE_OTHER): Payer: 59 | Admitting: Otolaryngology

## 2023-10-06 DIAGNOSIS — Z1231 Encounter for screening mammogram for malignant neoplasm of breast: Secondary | ICD-10-CM | POA: Diagnosis not present

## 2023-10-13 ENCOUNTER — Ambulatory Visit: Admitting: Family

## 2023-10-13 ENCOUNTER — Encounter: Payer: Self-pay | Admitting: Family

## 2023-10-13 DIAGNOSIS — Z0000 Encounter for general adult medical examination without abnormal findings: Secondary | ICD-10-CM | POA: Diagnosis not present

## 2023-10-13 NOTE — Progress Notes (Signed)
 This service is provided via telemedicine  No vital signs collected/recorded due to the encounter was a telemedicine visit.   Location of patient (ex: home, work):  Home  Patient consents to a telephone visit: Yes  Location of the provider (ex: office, home):  [facility], Office   Name of any referring provider:  N/A  Names of all persons participating in the telemedicine service and their role in the encounter:  Presley Broker, CMA, Patient, and   Time spent on call:  with medical assistant  Compton Brigance, Elijio Guadeloupe, NP      Subjective:   Tara Conner is a 78 y.o. female who presents for Medicare Annual (Subsequent) preventive examination.  Visit Complete: Virtual I connected with  Tara Conner on 10/13/23 by a video and audio enabled telemedicine application and verified that I am speaking with the correct person using two identifiers.  Patient Location: Home  Provider Location: Office/Clinic  I discussed the limitations of evaluation and management by telemedicine. The patient expressed understanding and agreed to proceed.  Vital Signs: Because this visit was a virtual/telehealth visit, some criteria may be missing or patient reported. Any vitals not documented were not able to be obtained and vitals that have been documented are patient reported.  Patient Medicare AWV questionnaire was completed by the patient on 10/13/2023; I have confirmed that all information answered by patient is correct and no changes since this date.        Objective:     There were no vitals filed for this visit. There is no height or weight on file to calculate BMI.     10/13/2023   11:15 AM 06/29/2023   10:06 AM 06/19/2023    9:00 PM 06/19/2023   12:13 PM 06/18/2023    9:43 PM 06/14/2023   11:19 AM 05/16/2023    2:46 PM  Advanced Directives  Does Patient Have a Medical Advance Directive? No No No No Yes No No  Does patient want to make changes to  medical advance directive?  No - Patient declined No - Patient declined No - Patient declined No - Patient declined    Would patient like information on creating a medical advance directive? No - Patient declined No - Patient declined No - Patient declined  No - Patient declined No - Patient declined No - Patient declined    Current Medications (verified) Outpatient Encounter Medications as of 10/13/2023  Medication Sig   acetaminophen  (TYLENOL ) 500 MG tablet Take 500 mg by mouth daily as needed for mild pain (pain score 1-3).   amLODipine  (NORVASC ) 5 MG tablet TAKE 1 TABLET(5 MG) BY MOUTH DAILY   atorvastatin  (LIPITOR) 40 MG tablet TAKE 1 TABLET(40 MG) BY MOUTH DAILY   Blood Glucose Monitoring Suppl (ONE TOUCH ULTRA 2) w/Device KIT by Does not apply route.   Chlorphen-Phenyleph-ASA (ALKA-SELTZER PLUS COLD PO) Take 1 tablet by mouth at bedtime as needed (for indigestion).   Cholecalciferol (VITAMIN D ) 50 MCG (2000 UT) tablet Take 1 tablet (2,000 Units total) by mouth daily.   cimetidine  (TAGAMET ) 300 MG tablet TAKE 1 TABLET(300 MG) BY MOUTH AT BEDTIME (Patient taking differently: Take 300 mg by mouth at bedtime as needed.)   clopidogrel  (PLAVIX ) 75 MG tablet Take 1 tablet (75 mg total) by mouth daily. TAKE 1 TABLET(75 MG) BY MOUTH DAILY   ferrous sulfate  325 (65 FE) MG tablet Take 1 tablet (325 mg total) by mouth 2 (two) times daily with a meal. (  Patient taking differently: Take 325 mg by mouth daily with breakfast.)   gabapentin  (NEURONTIN ) 300 MG capsule Take 1 capsule (300 mg total) by mouth 2 (two) times daily. (Patient taking differently: Take 300 mg by mouth as needed.)   insulin  glargine (LANTUS  SOLOSTAR) 100 UNIT/ML Solostar Pen Inject 30 Units into the skin at bedtime.   insulin  lispro (HUMALOG  KWIKPEN) 100 UNIT/ML KwikPen ADMINISTER 0 TO8UNITS UNDER THE SKIN INTO SKIN THREE TIMES DAILY (Patient taking differently: 10 Units. ADMINISTER 10 UNITS UNDER THE SKIN INTO SKIN THREE TIMES DAILY)    Insulin  Pen Needle (BD PEN NEEDLE NANO 2ND GEN) 32G X 4 MM MISC 1 Device by Other route 4 (four) times daily. E11.40   Insulin  Syringe-Needle U-100 (INSULIN  SYRINGE 1CC/31GX5/16") 31G X 5/16" 1 ML MISC USE AS DIRECTED WITH LANTUS    Lancets (ONETOUCH ULTRASOFT) lancets 1 each by Other route 4 (four) times daily. E11.40   losartan  (COZAAR ) 25 MG tablet TAKE 1 TABLET(25 MG) BY MOUTH DAILY   Multiple Vitamins-Minerals (MULTIVITAMIN ADULT PO) Take 1 tablet by mouth daily.   NON FORMULARY Omega XL supplement twice daily.   ONETOUCH ULTRA test strip TEST UPTO FOUR TIMES DAILY   polyethylene glycol powder (GLYCOLAX /MIRALAX ) 17 GM/SCOOP powder Take 17 g by mouth 2 (two) times daily as needed.   Continuous Glucose Receiver (FREESTYLE LIBRE 3 READER) DEVI 1 Device by Does not apply route in the morning, at noon, in the evening, and at bedtime. (Patient not taking: Reported on 10/13/2023)   Continuous Glucose Sensor (FREESTYLE LIBRE 3 SENSOR) MISC 1 Device by Does not apply route in the morning, at noon, in the evening, and at bedtime. (Patient not taking: Reported on 10/13/2023)   Glycerin-Polysorbate 80 (REFRESH DRY EYE THERAPY OP) Place 1-2 drops into both eyes as needed (for dryness). (Patient not taking: Reported on 10/13/2023)   pantoprazole  (PROTONIX ) 40 MG tablet Take 40 mg oral twice daily for 4 weeks, then daily after that. (Patient not taking: Reported on 10/13/2023)   No facility-administered encounter medications on file as of 10/13/2023.    Allergies (verified) Penicillins and Sulfa  antibiotics   History: Past Medical History:  Diagnosis Date   Diabetes mellitus without complication (HCC)    Diabetic retinopathy (HCC)    PDR OU   Hypertension    Hypertensive retinopathy    OU   Stage 3b chronic kidney disease (HCC) 07/10/2020   Stroke (HCC) 2019   Past Surgical History:  Procedure Laterality Date   CATARACT EXTRACTION Bilateral    COLONOSCOPY     [facility] possibly but not  sure. [name] it was negative   ENTEROSCOPY N/A 06/20/2023   Procedure: ENTEROSCOPY;  Surgeon: Lajuan Pila, MD;  Location[facility] ENDOSCOPY;  Service: Gastroenterology;  Laterality: N/A;   ESOPHAGOGASTRODUODENOSCOPY     around the same time said it was negative    EYE SURGERY Bilateral    Cat Sx OU   GALLBLADDER SURGERY     HEMOSTASIS CLIP PLACEMENT  06/20/2023   Procedure: HEMOSTASIS CLIP PLACEMENT;  Surgeon: Lajuan Pila, MD;  Location: [facility] ENDOSCOPY;  Service: Gastroenterology;;   HEMOSTASIS CONTROL  06/20/2023   Procedure: HEMOSTASIS CONTROL;  Surgeon: Lajuan Pila, MD;  Location: [facility] ENDOSCOPY;  Service: Gastroenterology;;   Family History  Problem Relation Age of Onset   Diabetes Mother    Breast cancer Sister 83   Diabetes Sister    Diabetes Daughter    Diabetes Brother    Colon cancer Neg Hx    Esophageal cancer  Neg Hx    Rectal cancer Neg Hx    Stomach cancer Neg Hx    Social History   Socioeconomic History   Marital status: Single    Spouse name: Not on file   Number of children: Not on file   Years of education: Not on file   Highest education level: Not on file  Occupational History   Not on file  Tobacco Use   Smoking status: Never   Smokeless tobacco: Never  Vaping Use   Vaping status: Never Used  Substance and Sexual Activity   Alcohol use: Never   Drug use: Never   Sexual activity: Not Currently  Other Topics Concern   Not on file  Social History Narrative   Not on file   Social Drivers of Health   Financial Resource Strain: Low Risk  (09/22/2021)   Overall Financial Resource Strain (CARDIA)    Difficulty of Paying Living Expenses: Not hard at all  Food Insecurity: No Food Insecurity (10/13/2023)   Hunger Vital Sign    Worried About Running Out of Food in the Last Year: Never true    Ran Out of Food in the Last Year: Never true  Transportation Needs: Unmet Transportation Needs (10/13/2023)   PRAPARE - Transportation    Lack of Transportation (Medical):  Yes    Lack of Transportation (Non-Medical): Yes  Physical Activity: Insufficiently Active (09/22/2021)   Exercise Vital Sign    Days of Exercise per Week: 2 days    Minutes of Exercise per Session: 20 min  Stress: No Stress Concern Present (09/22/2021)   [employer] of Occupational Health - Occupational Stress Questionnaire    Feeling of Stress : Not at all  Social Connections: Socially Isolated (06/19/2023)   Social Connection and Isolation Panel [NHANES]    Frequency of Communication with Friends and Family: More than three times a week    Frequency of Social Gatherings with Friends and Family: Once a week    Attends Religious Services: Never    Database administrator or Organizations: No    Attends Banker Meetings: Never    Marital Status: Divorced    Tobacco Counseling Counseling given: Not Answered   Clinical Intake:                        Activities of Daily Living    06/19/2023    9:00 PM 06/19/2023   12:13 PM  In your present state of health, do you have any difficulty performing the following activities:  Hearing? 1 1  Vision? 0 0  Difficulty concentrating or making decisions? 1 1  Doing errands, shopping? 0 0    Patient Care Team: Sanah Kraska, Elijio Guadeloupe, NP as PCP - General (Family Medicine) [facility]  Indicate any recent Medical Services you may have received from other than [facility] providers in the past year (date may be approximate).     Assessment:    This is a routine wellness examination for Tara Conner.  Hearing/Vision screen Hearing Screening - Comments:: No hearing issues Vision Screening - Comments:: Eye exam jan.2025 [facility] wear glasses no issues.   Goals Addressed   None    Depression Screen    10/13/2023   11:17 AM 06/29/2023   10:05 AM 06/14/2023   11:18 AM 05/16/2023    2:46 PM 02/22/2023   10:46 AM 01/27/2023    1:00 PM 09/26/2022    1:21 PM  PHQ  2/9 Scores  PHQ - 2 Score 0 0 0 0 0  0 0    Fall Risk    10/13/2023   11:17 AM 06/29/2023   10:05 AM 06/14/2023   11:18 AM 05/16/2023    2:45 PM 02/22/2023   10:46 AM  Fall Risk   Falls in the past year? 0 0 0 0 0  Number falls in past yr: 0 0 0 0   Injury with Fall? 0 0 0 0   Risk for fall due to : No Fall Risks      Follow up Falls evaluation completed        MEDICARE RISK AT HOME:    TIMED UP AND GO:  Was the test performed?  No    Cognitive Function:        10/13/2023   11:17 AM 09/26/2022    1:22 PM  6CIT Screen  What Year? 0 points 0 points  What month? 0 points   What time? 0 points 0 points  Count back from 20 0 points 0 points  Months in reverse 0 points 0 points  Repeat phrase 2 points 8 points  Total Score 2 points     Immunizations Immunization History  Administered Date(s) Administered   Influenza, High Dose Seasonal PF 03/07/2018   Influenza,inj,Quad PF,6+ Mos 01/31/2019   PFIZER(Purple Top)SARS-COV-2 Vaccination 10/03/2019, 10/29/2019    TDAP status: Due, Education has been provided regarding the importance of this vaccine. Advised may receive this vaccine at local pharmacy or Health Dept. Aware to provide a copy of the vaccination record if obtained from local pharmacy or Health Dept. Verbalized acceptance and understanding.  Flu Vaccine status: Declined, Education has been provided regarding the importance of this vaccine but patient still declined. Advised may receive this vaccine at local pharmacy or Health Dept. Aware to provide a copy of the vaccination record if obtained from local pharmacy or Health Dept. Verbalized acceptance and understanding.  Pneumococcal vaccine status: Due, Education has been provided regarding the importance of this vaccine. Advised may receive this vaccine at local pharmacy or Health Dept. Aware to provide a copy of the vaccination record if obtained from local pharmacy or Health Dept. Verbalized acceptance and understanding.  Covid-19 vaccine status:  Declined, Education has been provided regarding the importance of this vaccine but patient still declined. Advised may receive this vaccine at local pharmacy or Health Dept.or vaccine clinic. Aware to provide a copy of the vaccination record if obtained from local pharmacy or Health Dept. Verbalized acceptance and understanding.  Qualifies for Shingles Vaccine? Yes   Zostavax completed No   Shingrix Completed?: No.    Education has been provided regarding the importance of this vaccine. Patient has been advised to call insurance company to determine out of pocket expense if they have not yet received this vaccine. Advised may also receive vaccine at local pharmacy or Health Dept. Verbalized acceptance and understanding.  Screening Tests Health Maintenance  Topic Date Due   DEXA SCAN  Never done   Medicare Annual Wellness (AWV)  09/23/2022   FOOT EXAM  10/27/2022   Diabetic kidney evaluation - Urine ACR  11/07/2023   Pneumonia Vaccine 71+ Years old (1 of 2 - PCV) 11/07/2023 (Originally 01/18/1965)   Zoster Vaccines- Shingrix (1 of 2) 12/20/2023 (Originally 01/18/1997)   HEMOGLOBIN A1C  03/19/2024   OPHTHALMOLOGY EXAM  07/05/2024   Diabetic kidney evaluation - eGFR measurement  09/17/2024   MAMMOGRAM  10/05/2024   Hepatitis C Screening  Completed   HPV VACCINES  Aged Out   Meningococcal B Vaccine  Aged Out   DTaP/Tdap/Td  Discontinued   INFLUENZA VACCINE  Discontinued   Colonoscopy  Discontinued   COVID-19 Vaccine  Discontinued    Health Maintenance  Health Maintenance Due  Topic Date Due   DEXA SCAN  Never done   Medicare Annual Wellness (AWV)  09/23/2022   FOOT EXAM  10/27/2022   Diabetic kidney evaluation - Urine ACR  11/07/2023    Colorectal cancer screening: No longer required.   Mammogram status: No longer required due to advance age .  Bone Density status: Ordered scheduled 06/10/2024. Pt provided with contact info and advised to call to schedule appt.  Lung Cancer  Screening: (Low Dose CT Chest recommended if Age 74-80 years, 20 pack-year currently smoking OR have quit w/in 15years.) does not qualify.   Lung Cancer Screening Referral: N/A   Additional Screening:  Hepatitis C Screening: does qualify; Completed yes  Vision Screening: Recommended annual ophthalmology exams for early detection of glaucoma and other disorders of the eye. Is the patient up to date with their annual eye exam?  Yes  Who is the provider or what is the name of the office in which the patient attends annual eye exams? Dr.[facility] If pt is not established with a provider, would they like to be referred to a provider to establish care? No .   Dental Screening: Recommended annual dental exams for proper oral hygiene  Diabetic Foot Exam: Diabetic Foot Exam: Completed Patient missed appointment but will reschedule appointment.   Community Resource Referral / Chronic Care Management: CRR required this visit?  No   CCM required this visit?  No     Plan:     I have personally reviewed and noted the following in the patient's chart:   Medical and social history Use of alcohol, tobacco or illicit drugs  Current medications and supplements including opioid prescriptions. Patient is not currently taking opioid prescriptions. Functional ability and status Nutritional status Physical activity Advanced directives List of other physicians Hospitalizations, surgeries, and ER visits in previous 12 months Vitals Screenings to include cognitive, depression, and falls Referrals and appointments  In addition, I have reviewed and discussed with patient certain preventive protocols, quality metrics, and best practice recommendations. A written personalized care plan for preventive services as well as general preventive health recommendations were provided to patient.     Estil Heman, NP   10/13/2023   After Visit Summary: (MyChart) Due to this being a telephonic visit, the after  visit summary with patients personalized plan was offered to patient via MyChart   Nurse Notes: - patient will call to schedule appointment with Podiatrist  - Declined vaccine

## 2023-10-27 ENCOUNTER — Telehealth: Payer: Self-pay

## 2023-10-27 NOTE — Telephone Encounter (Signed)
 I called patient and notified her. She states that she uses a natural herb tea. She cannot really identify what all is in it. However she will bring to her next appointment so we can document it. Message routed back to PCP [name], Elijio Guadeloupe, NP

## 2023-10-27 NOTE — Telephone Encounter (Signed)
 Noted

## 2023-10-27 NOTE — Telephone Encounter (Signed)
 Copied from CRM [id number]. Topic: Clinical - Medication Question >> Oct 27, 2023 11:43 AM Tara Conner wrote: Reason for CRM: Patient is calling because she is wanting to know if its okay for her to take Tumeric 1000 mg one a day and some healing herbs 1/2 teaspoon for her arthritis. Patient is wanting to speak to provider ir nurse because she has some additional questions about this, she hasn't started and wont until then, patients callback number is [phone].

## 2023-10-27 NOTE — Telephone Encounter (Signed)
 Tumeric is okay to take for inflammation.what other herbs are you taking or planning to take?

## 2023-10-27 NOTE — Telephone Encounter (Signed)
Message routed to PCP [name], [name], NP  

## 2023-11-24 ENCOUNTER — Other Ambulatory Visit: Payer: Self-pay | Admitting: Family

## 2023-11-24 DIAGNOSIS — I10 Essential (primary) hypertension: Secondary | ICD-10-CM

## 2023-12-08 ENCOUNTER — Other Ambulatory Visit: Payer: Self-pay | Admitting: Family

## 2023-12-08 DIAGNOSIS — Z8673 Personal history of transient ischemic attack (TIA), and cerebral infarction without residual deficits: Secondary | ICD-10-CM

## 2023-12-17 ENCOUNTER — Other Ambulatory Visit: Payer: Self-pay | Admitting: Family

## 2023-12-17 DIAGNOSIS — E1140 Type 2 diabetes mellitus with diabetic neuropathy, unspecified: Secondary | ICD-10-CM

## 2023-12-18 NOTE — Telephone Encounter (Signed)
 Medication hasn't been refilled by [name], [name], NP / pharmacy also stated that patient is requesting refills so that patient won't miss any doses.

## 2023-12-26 ENCOUNTER — Telehealth

## 2023-12-26 DIAGNOSIS — E1140 Type 2 diabetes mellitus with diabetic neuropathy, unspecified: Secondary | ICD-10-CM

## 2023-12-26 MED ORDER — CONTOUR PLUS TEST VI STRP: 1.0000 | ORAL_STRIP | Freq: Four times a day (QID) | 12 refills | Status: AC

## 2023-12-26 MED ORDER — CONTOUR PLUS BLUE W/DEVICE KIT: 1.0000 | PACK | Freq: Four times a day (QID) | 11 refills | Status: AC

## 2023-12-26 NOTE — Telephone Encounter (Signed)
 Copied from CRM [id number]. Topic: Clinical - Medication Question >> Dec 26, 2023 11:19 AM Rosaria A wrote: Reason for CRM: Patient states that her insurance company will not be covering her 100 One Touch as of 12/29/23 and will need another prescription. Please call patient back.   [phone]

## 2023-12-26 NOTE — Telephone Encounter (Signed)
 Spoken with patient and sent over new supplies.

## 2024-01-22 ENCOUNTER — Other Ambulatory Visit: Payer: Self-pay | Admitting: Family

## 2024-01-22 DIAGNOSIS — E1140 Type 2 diabetes mellitus with diabetic neuropathy, unspecified: Secondary | ICD-10-CM

## 2024-01-22 NOTE — Telephone Encounter (Signed)
 Patient is requesting refill on medication that's refilled by another provider. Medication pend and sent to PCP [name], [name], NP

## 2024-01-23 ENCOUNTER — Other Ambulatory Visit: Payer: Self-pay | Admitting: Family Medicine

## 2024-01-23 DIAGNOSIS — K219 Gastro-esophageal reflux disease without esophagitis: Secondary | ICD-10-CM

## 2024-02-21 ENCOUNTER — Other Ambulatory Visit: Payer: Self-pay | Admitting: Family

## 2024-03-03 ENCOUNTER — Other Ambulatory Visit: Payer: Self-pay | Admitting: Family

## 2024-03-03 DIAGNOSIS — E1140 Type 2 diabetes mellitus with diabetic neuropathy, unspecified: Secondary | ICD-10-CM

## 2024-03-18 ENCOUNTER — Other Ambulatory Visit

## 2024-03-18 DIAGNOSIS — N1832 Chronic kidney disease, stage 3b: Secondary | ICD-10-CM | POA: Diagnosis not present

## 2024-03-18 DIAGNOSIS — E785 Hyperlipidemia, unspecified: Secondary | ICD-10-CM | POA: Diagnosis not present

## 2024-03-18 DIAGNOSIS — I10 Essential (primary) hypertension: Secondary | ICD-10-CM | POA: Diagnosis not present

## 2024-03-18 DIAGNOSIS — K219 Gastro-esophageal reflux disease without esophagitis: Secondary | ICD-10-CM | POA: Diagnosis not present

## 2024-03-18 DIAGNOSIS — E1140 Type 2 diabetes mellitus with diabetic neuropathy, unspecified: Secondary | ICD-10-CM | POA: Diagnosis not present

## 2024-03-18 LAB — HEMOGLOBIN A1C
Hgb A1c MFr Bld: 9.6 % — ABNORMAL HIGH (ref <5.7)
Mean Plasma Glucose: 229 mg/dL
eAG (mmol/L): 12.7 mmol/L

## 2024-03-18 LAB — CBC WITH DIFFERENTIAL/PLATELET
Absolute Lymphocytes: 1971 {cells}/uL (ref 850–3900)
Absolute Monocytes: 524 {cells}/uL (ref 200–950)
Basophils Absolute: 59 {cells}/uL (ref 0–200)
Basophils Relative: 1.1 %
Eosinophils Absolute: 151 {cells}/uL (ref 15–500)
Eosinophils Relative: 2.8 %
HCT: 30.6 % — ABNORMAL LOW (ref 35.0–45.0)
Hemoglobin: 10.2 g/dL — ABNORMAL LOW (ref 11.7–15.5)
MCH: 29 pg (ref 27.0–33.0)
MCHC: 33.3 g/dL (ref 32.0–36.0)
MCV: 86.9 fL (ref 80.0–100.0)
MPV: 9.6 fL (ref 7.5–12.5)
Monocytes Relative: 9.7 %
Neutro Abs: 2695 {cells}/uL (ref 1500–7800)
Neutrophils Relative %: 49.9 %
Platelets: 451 Thousand/uL — ABNORMAL HIGH (ref 140–400)
RBC: 3.52 Million/uL — ABNORMAL LOW (ref 3.80–5.10)
RDW: 13.1 % (ref 11.0–15.0)
Total Lymphocyte: 36.5 %
WBC: 5.4 Thousand/uL (ref 3.8–10.8)

## 2024-03-18 LAB — COMPLETE METABOLIC PANEL WITHOUT GFR
AG Ratio: 1.1 (calc) (ref 1.0–2.5)
ALT: 21 U/L (ref 6–29)
AST: 21 U/L (ref 10–35)
Albumin: 4 g/dL (ref 3.6–5.1)
Alkaline phosphatase (APISO): 89 U/L (ref 37–153)
BUN: 24 mg/dL (ref 7–25)
CO2: 24 mmol/L (ref 20–32)
Calcium: 9.3 mg/dL (ref 8.6–10.4)
Chloride: 106 mmol/L (ref 98–110)
Creat: 0.99 mg/dL (ref 0.60–1.00)
Globulin: 3.5 g/dL (ref 1.9–3.7)
Glucose, Bld: 188 mg/dL — ABNORMAL HIGH (ref 65–99)
Potassium: 4.5 mmol/L (ref 3.5–5.3)
Sodium: 139 mmol/L (ref 135–146)
Total Bilirubin: 0.4 mg/dL (ref 0.2–1.2)
Total Protein: 7.5 g/dL (ref 6.1–8.1)

## 2024-03-18 LAB — LIPID PANEL
Cholesterol: 116 mg/dL (ref <200)
HDL: 42 mg/dL — ABNORMAL LOW (ref >=50)
LDL Cholesterol (Calc): 57 mg/dL
Non-HDL Cholesterol (Calc): 74 mg/dL (ref <130)
Total CHOL/HDL Ratio: 2.8 (calc) (ref <5.0)
Triglycerides: 87 mg/dL (ref <150)

## 2024-03-18 LAB — TSH: TSH: 0.91 m[IU]/L (ref 0.40–4.50)

## 2024-03-21 ENCOUNTER — Encounter: Payer: Self-pay | Admitting: Family

## 2024-03-21 ENCOUNTER — Ambulatory Visit: Admitting: Family

## 2024-03-21 VITALS — BP 128/78 | HR 90 | Temp 97.8°F | Resp 19 | Ht 67.0 in | Wt 206.0 lb

## 2024-03-21 DIAGNOSIS — Z794 Long term (current) use of insulin: Secondary | ICD-10-CM

## 2024-03-21 DIAGNOSIS — E1140 Type 2 diabetes mellitus with diabetic neuropathy, unspecified: Secondary | ICD-10-CM | POA: Diagnosis not present

## 2024-03-21 DIAGNOSIS — E785 Hyperlipidemia, unspecified: Secondary | ICD-10-CM | POA: Diagnosis not present

## 2024-03-21 DIAGNOSIS — I10 Essential (primary) hypertension: Secondary | ICD-10-CM | POA: Diagnosis not present

## 2024-03-21 DIAGNOSIS — K219 Gastro-esophageal reflux disease without esophagitis: Secondary | ICD-10-CM

## 2024-03-21 DIAGNOSIS — D509 Iron deficiency anemia, unspecified: Secondary | ICD-10-CM

## 2024-03-21 DIAGNOSIS — N1832 Chronic kidney disease, stage 3b: Secondary | ICD-10-CM | POA: Diagnosis not present

## 2024-03-21 DIAGNOSIS — Z8673 Personal history of transient ischemic attack (TIA), and cerebral infarction without residual deficits: Secondary | ICD-10-CM

## 2024-03-21 DIAGNOSIS — K5901 Slow transit constipation: Secondary | ICD-10-CM

## 2024-03-21 MED ORDER — LANTUS SOLOSTAR 100 UNIT/ML ~~LOC~~ SOPN: 44.0000 [IU] | PEN_INJECTOR | Freq: Every day | SUBCUTANEOUS | 3 refills | Status: AC

## 2024-03-21 MED ORDER — INSULIN LISPRO (1 UNIT DIAL) 100 UNIT/ML (KWIKPEN): 10.0000 [IU] | PEN_INJECTOR | Freq: Three times a day (TID) | SUBCUTANEOUS | 3 refills | Status: DC

## 2024-03-21 NOTE — Patient Instructions (Addendum)
 Please call and reschedule appointment with foot Doctor to trim toenail [facility] [phone]

## 2024-03-26 NOTE — Progress Notes (Signed)
 Tara Conner                                          MRN: [id number]   03/26/2024   The VBCI Quality Team Specialist reviewed this patient medical record for the purposes of chart review for care gap closure. The following were reviewed: chart review for care gap closure-kidney health evaluation for diabetes:eGFR  and uACR.    VBCI Quality Team

## 2024-03-28 ENCOUNTER — Ambulatory Visit: Payer: Self-pay | Admitting: Family

## 2024-04-03 ENCOUNTER — Other Ambulatory Visit: Payer: Self-pay | Admitting: Family

## 2024-04-03 DIAGNOSIS — K219 Gastro-esophageal reflux disease without esophagitis: Secondary | ICD-10-CM

## 2024-04-03 NOTE — Progress Notes (Signed)
 Provider: Roxan Plough FNP-C   [place] Mccaskey, Roxan BROCKS, NP  Patient Care Team: Coby Antrobus, Roxan BROCKS, NP as PCP - General (Family Medicine) [facility]  Extended Emergency Contact Information Primary Emergency Contact: [facility] Mobile Phone: [phone] Relation: Daughter Secondary Emergency Contact: Wilson,Rachel Mobile Phone: [phone] Relation: Daughter  Code Status: Full code Goals of care: Advanced Directive information    03/21/2024   11:08 AM  Advanced Directives  Does Patient Have a Medical Advance Directive? No  Would patient like information on creating a medical advance directive? No - Patient declined     Chief Complaint  Patient presents with   Medical Management of Chronic Issues    6 Month follow up.    Discussed the use of AI scribe software for clinical note transcription with the patient, who gave verbal consent to proceed.  History of Present Illness   Tara Conner is a 78 year old female with diabetes and neuropathy who presents for a six-month follow-up.  She experiences neuropathy, described as a 'funny feeling' in her feet and sometimes in her hands. She believes it may be related to her diabetes. She has used gabapentin  for this condition but only takes it as needed due to concerns about side effects.  Her diabetes is managed with Lantus  and Humalog , taking 35 units of Lantus  at night and 15 units of Humalog  after meals. She has difficulty with her new glucose monitoring system, [place], and is not checking her blood sugars daily. She is working on improving her diet by reducing bread and pork intake and increasing water consumption.  She has a history of stroke and is on Plavix  75 mg daily. She reports weakness in her left hand and leg, which she attributes to her previous stroke. No bleeding issues have been experienced.  Her current medications include Tylenol  as needed, amlodipine  5 mg, atorvastatin  40 mg, Alka-Seltzer as  needed, vitamin D , Tagamet  300 mg as needed for acid reflux, ferrous sulfate  for anemia, and Refresh eye drops as needed. She also takes Miralax  for constipation as needed and has recently started taking turmeric. She has stopped taking Omega XL.  She has a history of anemia. She continues to take iron  supplements and monitors for any signs of bleeding.  She experiences occasional bloating after eating certain foods and uses Alka-Seltzer as needed. She also reports getting up twice at night to urinate but has no other urinary issues.  She has been trying to increase her physical activity but has faced challenges due to transportation issues. She plans to resume her exercise routine with the help of her insurance-covered gym membership.     Past Medical History:  Diagnosis Date   Diabetes mellitus without complication (HCC)    Diabetic retinopathy (HCC)    PDR OU   Hypertension    Hypertensive retinopathy    OU   Stage 3b chronic kidney disease (HCC) 07/10/2020   Stroke (HCC) 2019   Past Surgical History:  Procedure Laterality Date   CATARACT EXTRACTION Bilateral    COLONOSCOPY     [facility] possibly but not sure. [place] it was negative   ENTEROSCOPY N/A 06/20/2023   Procedure: ENTEROSCOPY;  Surgeon: Charlanne Groom, MD;  Location: [facility] ENDOSCOPY;  Service: Gastroenterology;  Laterality: N/A;   ESOPHAGOGASTRODUODENOSCOPY     around the same time said it was negative    EYE SURGERY Bilateral    Cat Sx OU   GALLBLADDER SURGERY     HEMOSTASIS CLIP PLACEMENT  06/20/2023  Procedure: HEMOSTASIS CLIP PLACEMENT;  Surgeon: Charlanne Groom, MD;  Location: [facility] ENDOSCOPY;  Service: Gastroenterology;;   HEMOSTASIS CONTROL  06/20/2023   Procedure: HEMOSTASIS CONTROL;  Surgeon: Charlanne Groom, MD;  Location: [facility] ENDOSCOPY;  Service: Gastroenterology;;    Allergies  Allergen Reactions   Penicillins     Said she was young had a reaction    Sulfa  Antibiotics Other (See Comments)    Pt unable to  report reaction Said she had a rash    Allergies as of 03/21/2024       Reactions   Penicillins    Said she was young had a reaction    Sulfa  Antibiotics Other (See Comments)   Pt unable to report reaction Said she had a rash        Medication List        Accurate as of March 21, 2024 11:59 PM. If you have any questions, ask your nurse or doctor.          STOP taking these medications    NON FORMULARY Stopped by: Roxan BROCKS Ariany Kesselman   pantoprazole  40 MG tablet Commonly known as: Protonix  Stopped by: Nathaniel Wakeley C Neftaly Swiss       TAKE these medications    acetaminophen  500 MG tablet Commonly known as: TYLENOL  Take 500 mg by mouth daily as needed for mild pain (pain score 1-3).   ALKA-SELTZER PLUS COLD PO Take 1 tablet by mouth at bedtime as needed (for indigestion).   amLODipine  5 MG tablet Commonly known as: NORVASC  TAKE 1 TABLET(5 MG) BY MOUTH DAILY   atorvastatin  40 MG tablet Commonly known as: LIPITOR TAKE 1 TABLET(40 MG) BY MOUTH DAILY   BD Pen Needle Nano 2nd Gen 32G X 4 MM Misc Generic drug: Insulin  Pen Needle USE FOUR TIMES DAILY   cimetidine  300 MG tablet Commonly known as: TAGAMET  TAKE 1 TABLET(300 MG) BY MOUTH AT BEDTIME What changed: See the new instructions.   clopidogrel  75 MG tablet Commonly known as: PLAVIX  TAKE 1 TABLET(75 MG) BY MOUTH DAILY   Contour Plus Blue w/Device Kit 1 Device by Does not apply route 4 (four) times daily. E11.40   Contour Plus Test test strip Generic drug: glucose blood 1 each by Other route 4 (four) times daily. E11.40   ferrous sulfate  325 (65 FE) MG tablet Take 1 tablet (325 mg total) by mouth 2 (two) times daily with a meal. What changed: when to take this   FreeStyle Libre 3 Reader Devi 1 Device by Does not apply route in the morning, at noon, in the evening, and at bedtime.   gabapentin  300 MG capsule Commonly known as: NEURONTIN  Take 1 capsule (300 mg total) by mouth 2 (two) times daily. What  changed:  when to take this reasons to take this   insulin  lispro 100 UNIT/ML KwikPen Commonly known as: HumaLOG  KwikPen Inject 10 Units into the skin 3 (three) times daily. ADMINISTER 10 UNITS UNDER THE SKIN INTO SKIN THREE TIMES DAILY   INSULIN  SYRINGE 1CC/31GX5/16 31G X 5/16 1 ML Misc USE AS DIRECTED WITH LANTUS    Lantus  SoloStar 100 UNIT/ML Solostar Pen Generic drug: insulin  glargine Inject 44 Units into the skin daily. What changed: See the new instructions. Changed by: Olamide Carattini C Biran Mayberry   losartan  25 MG tablet Commonly known as: COZAAR  TAKE 1 TABLET(25 MG) BY MOUTH DAILY   MULTIVITAMIN ADULT PO Take 1 tablet by mouth daily.   polyethylene glycol powder 17 GM/SCOOP powder Commonly known as: GLYCOLAX /MIRALAX  Take 17 g  by mouth 2 (two) times daily as needed.   REFRESH DRY EYE THERAPY OP Place 1-2 drops into both eyes as needed (for dryness).   TURMERIC PO Take by mouth daily.   Vitamin D  50 MCG (2000 UT) tablet Take 1 tablet (2,000 Units total) by mouth daily.        Review of Systems  Constitutional:  Negative for appetite change, chills, fatigue, fever and unexpected weight change.  HENT:  Negative for congestion, dental problem, ear discharge, ear pain, facial swelling, hearing loss, nosebleeds, postnasal drip, rhinorrhea, sinus pressure, sinus pain, sneezing, sore throat, tinnitus and trouble swallowing.   Eyes:  Negative for pain, discharge, redness, itching and visual disturbance.  Respiratory:  Negative for cough, chest tightness, shortness of breath and wheezing.   Cardiovascular:  Negative for chest pain, palpitations and leg swelling.  Gastrointestinal:  Negative for abdominal distention, abdominal pain, blood in stool, constipation, diarrhea, nausea and vomiting.  Endocrine: Negative for cold intolerance, heat intolerance, polydipsia, polyphagia and polyuria.  Genitourinary:  Negative for difficulty urinating, dysuria, flank pain, frequency and urgency.   Musculoskeletal:  Positive for arthralgias and gait problem. Negative for back pain, joint swelling, myalgias, neck pain and neck stiffness.  Skin:  Negative for color change, pallor, rash and wound.  Neurological:  Negative for dizziness, syncope, speech difficulty, weakness, light-headedness, numbness and headaches.  Hematological:  Does not bruise/bleed easily.  Psychiatric/Behavioral:  Negative for agitation, behavioral problems, confusion, hallucinations, self-injury, sleep disturbance and suicidal ideas. The patient is not nervous/anxious.     Immunization History  Administered Date(s) Administered   INFLUENZA, HIGH DOSE SEASONAL PF 03/07/2018   Influenza,inj,Quad PF,6+ Mos 01/31/2019   PFIZER(Purple Top)SARS-COV-2 Vaccination 10/03/2019, 10/29/2019   Pertinent  Health Maintenance Due  Topic Date Due   DEXA SCAN  Never done   FOOT EXAM  10/27/2022   OPHTHALMOLOGY EXAM  07/05/2024   HEMOGLOBIN A1C  09/16/2024   Mammogram  10/05/2024   Influenza Vaccine  Discontinued   Colonoscopy  Discontinued      05/16/2023    2:45 PM 06/14/2023   11:18 AM 06/29/2023   10:05 AM 10/13/2023   11:17 AM 03/21/2024   11:08 AM  Fall Risk  Falls in the past year? 0 0 0 0 0  Was there an injury with Fall? 0 0 0 0 0  Fall Risk Category Calculator 0 0 0 0 0  Patient at Risk for Falls Due to    No Fall Risks No Fall Risks  Fall risk Follow up    Falls evaluation completed Falls evaluation completed   Functional Status Survey:    Vitals:   03/21/24 1113  BP: 128/78  Pulse: 90  Resp: 19  Temp: 97.8 F (36.6 C)  SpO2: 98%  Weight: 206 lb (93.4 kg)  Height: 5' 7 (1.702 m)   Body mass index is 32.26 kg/m. Physical Exam  GENERAL: Alert, cooperative, well developed, no acute distress. HEENT: Normocephalic, normal oropharynx, moist mucous membranes, ears normal, nose normal. NECK: No cervical lymphadenopathy, thyroid  normal. CHEST: Clear to auscultation bilaterally, no wheezes, rhonchi,  or crackles. CARDIOVASCULAR: Normal heart rate and rhythm, S1 and S2 normal without murmurs. ABDOMEN: Soft, non-tender, non-distended, without organomegaly, normal bowel sounds. EXTREMITIES: No cyanosis or edema, sensation intact. NEUROLOGICAL: Cranial nerves grossly intact, moves all extremities without gross motor or sensory deficit.  SKIN: No rash,no lesion or erythema   PSYCHIATRY/BEHAVIORAL: Mood stable    Labs reviewed: Recent Labs    06/21/23 0444 06/22/23 0458  06/23/23 0528 06/29/23 1134 09/18/23 1007 03/18/24 1421  NA 140 134* 138 138 137 139  K 3.6 3.6 3.8 4.5 4.4 4.5  CL 108 103 107 106 104 106  CO2 23 21* 23 23 24 24   GLUCOSE 107* 165* 170* 189* 142* 188*  BUN 24* 24* 23 17 18 24   CREATININE 1.14* 1.31* 1.16* 1.30* 1.20* 0.99  CALCIUM  8.9 8.3* 8.7* 9.2 9.2 9.3  MG 1.7 1.7 1.7  --   --   --   PHOS 4.6 4.6 4.6  --   --   --    Recent Labs    06/18/23 2343 09/18/23 1007 03/18/24 1421  AST 20 17 21   ALT 16 13 21   ALKPHOS 54  --   --   BILITOT 0.4 0.3 0.4  PROT 6.2* 7.5 7.5  ALBUMIN 3.0*  --   --    Recent Labs    06/29/23 1134 09/18/23 1007 03/18/24 1421  WBC 7.4 5.5 5.4  NEUTROABS 4,928 3,207 2,695  HGB 9.3* 10.1* 10.2*  HCT 28.8* 31.2* 30.6*  MCV 87.8 85.7 86.9  PLT 503* 403* 451*   Lab Results  Component Value Date   TSH 0.91 03/18/2024   Lab Results  Component Value Date   HGBA1C 9.6 (H) 03/18/2024   Lab Results  Component Value Date   CHOL 116 03/18/2024   HDL 42 (L) 03/18/2024   LDLCALC 57 03/18/2024   LDLDIRECT 54.0 08/10/2021   TRIG 87 03/18/2024   CHOLHDL 2.8 03/18/2024    Significant Diagnostic Results in last 30 days:  No results found.  Assessment/Plan  Type 2 Diabetes Mellitus with Diabetic Neuropathy Diabetes management is suboptimal with recent A1c increase to 9.6%. Neuropathy symptoms present in hands and feet, likely secondary to diabetes. Current insulin  regimen includes Lantus  35 units at night and Humalog  15  units post-meal. Blood glucose monitoring is inconsistent due to issues with current monitoring system. - Increase Lantus  to 40 units at night. - Order continuous glucose monitor and sensor. - Encourage dietary modifications to reduce carbohydrate intake, particularly bread. - Advise on alternative snacks such as almonds and pistachios. - Encourage regular blood glucose monitoring. - Discuss potential use of over-the-counter supplement Nervvi after review.  Diabetic Foot Care and Bunion Bunion present but well-maintained. Neuropathy affects sensation in feet. Missed recent podiatry appointment. - Reschedule appointment with podiatrist for foot care and toenail trimming. - Advise on proper footwear to avoid pressure on bunion.  Chronic Kidney Disease, Stage 3b Kidney function has improved with creatinine level now at 0.99, indicating good hydration and control. - Encourage adequate hydration.  Essential Hypertension Blood pressure management appears well-controlled with current medication regimen including amlodipine  and losartan . - Continue current antihypertensive medications.  Hyperlipidemia Cholesterol levels are well-controlled with atorvastatin . Total cholesterol is 116, LDL is 57, and triglycerides are 87. - Continue atorvastatin  40 mg daily.  Iron  Deficiency Anemia Chronic iron  deficiency anemia with slight improvement in hemoglobin to 10.2. Continues to take ferrous sulfate . - Continue ferrous sulfate  supplementation. - Monitor for signs of gastrointestinal bleeding.  Constipation Constipation managed with Miralax  as needed. Bowel movements are regular with current regimen. - Continue Miralax  as needed. - Encourage increased water intake and dietary fiber.  Left-sided Hemiparesis due to Prior Stroke Residual weakness in left hand and leg post-stroke. Continues on Plavix  for stroke prevention. - Continue Plavix  75 mg daily.   Family/ staff Communication: Reviewed plan of  care with patient verbalized understanding  Labs/tests ordered:  -  CBC with Differential/Platelet - CMP with eGFR([facility]) - TSH - Hgb A1C - Lipid panel   Next Appointment : Return in about 6 months (around 09/19/2024) for medical mangement of chronic issues., Fasting labs in 6 months prior to visit.   Spent 30 minutes of Face to face and non-face to face with patient  >50% time spent counseling; reviewing medical record; tests; labs; documentation and developing future plan of care.   Roxan JAYSON Plough, NP

## 2024-04-04 NOTE — Telephone Encounter (Signed)
 Please advise never prescribed by you.

## 2024-05-14 ENCOUNTER — Other Ambulatory Visit: Payer: Self-pay | Admitting: Family

## 2024-05-14 DIAGNOSIS — E7800 Pure hypercholesterolemia, unspecified: Secondary | ICD-10-CM

## 2024-05-14 DIAGNOSIS — Z8673 Personal history of transient ischemic attack (TIA), and cerebral infarction without residual deficits: Secondary | ICD-10-CM

## 2024-05-16 ENCOUNTER — Inpatient Hospital Stay (HOSPITAL_BASED_OUTPATIENT_CLINIC_OR_DEPARTMENT_OTHER): Admission: RE | Admit: 2024-05-16 | Source: Ambulatory Visit

## 2024-05-18 ENCOUNTER — Other Ambulatory Visit: Payer: Self-pay | Admitting: Family

## 2024-05-18 DIAGNOSIS — I10 Essential (primary) hypertension: Secondary | ICD-10-CM

## 2024-06-06 ENCOUNTER — Encounter: Payer: Self-pay | Admitting: Family

## 2024-06-06 ENCOUNTER — Ambulatory Visit: Admitting: Family

## 2024-06-06 VITALS — BP 128/88 | HR 82 | Temp 98.0°F | Resp 18 | Ht 67.0 in | Wt 212.0 lb

## 2024-06-06 DIAGNOSIS — M19042 Primary osteoarthritis, left hand: Secondary | ICD-10-CM | POA: Diagnosis not present

## 2024-06-06 DIAGNOSIS — M19041 Primary osteoarthritis, right hand: Secondary | ICD-10-CM | POA: Diagnosis not present

## 2024-06-06 DIAGNOSIS — E1140 Type 2 diabetes mellitus with diabetic neuropathy, unspecified: Secondary | ICD-10-CM | POA: Diagnosis not present

## 2024-06-06 DIAGNOSIS — Z1152 Encounter for screening for COVID-19: Secondary | ICD-10-CM | POA: Diagnosis not present

## 2024-06-06 DIAGNOSIS — Z794 Long term (current) use of insulin: Secondary | ICD-10-CM | POA: Diagnosis not present

## 2024-06-06 DIAGNOSIS — R051 Acute cough: Secondary | ICD-10-CM

## 2024-06-06 LAB — POC COVID19 BINAXNOW: SARS Coronavirus 2 Ag: NEGATIVE

## 2024-06-06 LAB — POCT INFLUENZA A/B
Influenza A, POC: NEGATIVE
Influenza B, POC: NEGATIVE

## 2024-06-06 MED ORDER — INSULIN LISPRO (1 UNIT DIAL) 100 UNIT/ML (KWIKPEN): 10.0000 [IU] | PEN_INJECTOR | Freq: Three times a day (TID) | SUBCUTANEOUS | 3 refills | Status: AC

## 2024-06-06 MED ORDER — MUCINEX DM 30-600 MG PO TB12: 1.0000 | ORAL_TABLET | Freq: Two times a day (BID) | ORAL | 0 refills | Status: AC

## 2024-06-10 ENCOUNTER — Other Ambulatory Visit

## 2024-06-16 NOTE — Progress Notes (Signed)
 "  Provider: Arn Mcomber FNP-C  Kieley Akter C, NP  Patient Care Team: Jermya Dowding, Roxan BROCKS, NP as PCP - General (Family Medicine) [facility]  Extended Emergency Contact Information Primary Emergency Contact: [facility] Mobile Phone: [phone] Relation: Daughter Secondary Emergency Contact: Wilson,Rachel Mobile Phone: [phone] Relation: Daughter  Code Status: Full code Goals of care: Advanced Directive information    03/21/2024   11:08 AM  Advanced Directives  Does Patient Have a Medical Advance Directive? No  Would patient like information on creating a medical advance directive? No - Patient declined     Chief Complaint  Patient presents with   Cough    Cough, yellow phlegm   History of Present Illness   Tara Conner is a 79 year old female with diabetes who presents with a cough and recent COVID-19 diagnosis.  She developed a cough approximately one week ago after returning from a trip to [place] , where she attended a large church meeting. Her daughter suggested this might be where she contracted the illness. The cough was initially frequent but has since improved, though she still experiences occasional coughing, especially at night, with white phlegm production. No fever, chills, or runny nose, with only one instance of nasal discharge early in the illness.  She visited an urgent care facility and tested positive for COVID-19. Due to her diabetes and use of blood thinners, she was not prescribed any medication. She has been closely monitoring her blood sugar levels, which have ranged from 97 to 141 mg/dL over the past few days. She has been consuming orange juice and green tea to help with her symptoms while being mindful of her blood sugar levels.  She reports that her little finger on one hand is unable to straighten and is painful. She has not yet seen an orthopedic specialist for this issue.  She lives alone and recently  traveled to [place]  for a convention.    Past Medical History:  Diagnosis Date   Diabetes mellitus without complication (HCC)    Diabetic retinopathy (HCC)    PDR OU   Hypertension    Hypertensive retinopathy    OU   Stage 3b chronic kidney disease (HCC) 07/10/2020   Stroke (HCC) 2019   Past Surgical History:  Procedure Laterality Date   CATARACT EXTRACTION Bilateral    COLONOSCOPY     [facility] possibly but not sure. [place] it was negative   ENTEROSCOPY N/A 06/20/2023   Procedure: ENTEROSCOPY;  Surgeon: Charlanne Groom, MD;  Location: [facility] ENDOSCOPY;  Service: Gastroenterology;  Laterality: N/A;   ESOPHAGOGASTRODUODENOSCOPY     around the same time said it was negative    EYE SURGERY Bilateral    Cat Sx OU   GALLBLADDER SURGERY     HEMOSTASIS CLIP PLACEMENT  06/20/2023   Procedure: HEMOSTASIS CLIP PLACEMENT;  Surgeon: Charlanne Groom, MD;  Location: [facility] ENDOSCOPY;  Service: Gastroenterology;;   HEMOSTASIS CONTROL  06/20/2023   Procedure: HEMOSTASIS CONTROL;  Surgeon: Charlanne Groom, MD;  Location: [facility] ENDOSCOPY;  Service: Gastroenterology;;    Allergies[1]  Outpatient Encounter Medications as of 06/06/2024  Medication Sig   acetaminophen  (TYLENOL ) 500 MG tablet Take 500 mg by mouth daily as needed for mild pain (pain score 1-3).   amLODipine  (NORVASC ) 5 MG tablet TAKE 1 TABLET(5 MG) BY MOUTH DAILY   atorvastatin  (LIPITOR) 40 MG tablet TAKE 1 TABLET(40 MG) BY MOUTH DAILY   BD PEN NEEDLE NANO 2ND GEN 32G X 4 MM MISC USE FOUR TIMES  DAILY   Blood Glucose Monitoring Suppl (CONTOUR PLUS BLUE) w/Device KIT 1 Device by Does not apply route 4 (four) times daily. E11.40   Chlorphen-Phenyleph-ASA (ALKA-SELTZER PLUS COLD PO) Take 1 tablet by mouth at bedtime as needed (for indigestion).   Cholecalciferol (VITAMIN D ) 50 MCG (2000 UT) tablet Take 1 tablet (2,000 Units total) by mouth daily.   cimetidine  (TAGAMET ) 300 MG tablet TAKE 1 TABLET(300 MG) BY MOUTH AT BEDTIME   clopidogrel   (PLAVIX ) 75 MG tablet TAKE 1 TABLET(75 MG) BY MOUTH DAILY   Dextromethorphan-guaiFENesin  (MUCINEX  DM) 30-600 MG TB12 Take 1 tablet by mouth in the morning and at bedtime for 10 days.   ferrous sulfate  325 (65 FE) MG tablet Take 1 tablet (325 mg total) by mouth 2 (two) times daily with a meal. (Patient taking differently: Take 325 mg by mouth daily with breakfast.)   gabapentin  (NEURONTIN ) 300 MG capsule Take 1 capsule (300 mg total) by mouth 2 (two) times daily. (Patient taking differently: Take 300 mg by mouth as needed.)   glucose blood (CONTOUR PLUS TEST) test strip 1 each by Other route 4 (four) times daily. E11.40   Glycerin-Polysorbate 80 (REFRESH DRY EYE THERAPY OP) Place 1-2 drops into both eyes as needed (for dryness).   insulin  glargine (LANTUS  SOLOSTAR) 100 UNIT/ML Solostar Pen Inject 44 Units into the skin daily.   Insulin  Syringe-Needle U-100 (INSULIN  SYRINGE 1CC/31GX5/16) 31G X 5/16 1 ML MISC USE AS DIRECTED WITH LANTUS    losartan  (COZAAR ) 25 MG tablet TAKE 1 TABLET(25 MG) BY MOUTH DAILY   Multiple Vitamins-Minerals (MULTIVITAMIN ADULT PO) Take 1 tablet by mouth daily.   polyethylene glycol powder (GLYCOLAX /MIRALAX ) 17 GM/SCOOP powder Take 17 g by mouth 2 (two) times daily as needed.   TURMERIC PO Take by mouth daily.   [DISCONTINUED] insulin  lispro (HUMALOG  KWIKPEN) 100 UNIT/ML KwikPen Inject 10 Units into the skin 3 (three) times daily. ADMINISTER 10 UNITS UNDER THE SKIN INTO SKIN THREE TIMES DAILY   insulin  lispro (HUMALOG  KWIKPEN) 100 UNIT/ML KwikPen Inject 10 Units into the skin 3 (three) times daily. ADMINISTER 10 UNITS UNDER THE SKIN INTO SKIN THREE TIMES DAILY   [DISCONTINUED] Continuous Glucose Receiver (FREESTYLE LIBRE 3 READER) DEVI 1 Device by Does not apply route in the morning, at noon, in the evening, and at bedtime. (Patient not taking: Reported on 06/06/2024)   No facility-administered encounter medications on file as of 06/06/2024.    Review of Systems   Constitutional:  Negative for appetite change, chills, fatigue, fever and unexpected weight change.  HENT:  Negative for congestion, dental problem, ear discharge, ear pain, facial swelling, hearing loss, nosebleeds, postnasal drip, rhinorrhea, sinus pressure, sinus pain, sneezing, sore throat, tinnitus and trouble swallowing.   Eyes:  Negative for pain, discharge, redness, itching and visual disturbance.  Respiratory:  Positive for cough. Negative for chest tightness, shortness of breath and wheezing.   Cardiovascular:  Negative for chest pain, palpitations and leg swelling.  Gastrointestinal:  Negative for abdominal distention, abdominal pain, blood in stool, constipation, diarrhea, nausea and vomiting.  Genitourinary:  Negative for difficulty urinating, dysuria, flank pain, frequency and urgency.  Musculoskeletal:  Negative for arthralgias, back pain, gait problem, joint swelling, myalgias, neck pain and neck stiffness.  Skin:  Negative for color change, pallor and rash.  Neurological:  Negative for dizziness, weakness, light-headedness, numbness and headaches.    Immunization History  Administered Date(s) Administered   INFLUENZA, HIGH DOSE SEASONAL PF 03/07/2018   Influenza,inj,Quad PF,6+ Mos 01/31/2019   PFIZER(Purple  Top)SARS-COV-2 Vaccination 10/03/2019, 10/29/2019   Pertinent  Health Maintenance Due  Topic Date Due   Bone Density Scan  Never done   FOOT EXAM  10/27/2022   OPHTHALMOLOGY EXAM  07/05/2024   HEMOGLOBIN A1C  09/16/2024   Mammogram  10/05/2024   Influenza Vaccine  Discontinued   Colonoscopy  Discontinued      05/16/2023    2:45 PM 06/14/2023   11:18 AM 06/29/2023   10:05 AM 10/13/2023   11:17 AM 03/21/2024   11:08 AM  Fall Risk  Falls in the past year? 0 0 0 0 0  Was there an injury with Fall? 0  0  0  0  0   Fall Risk Category Calculator 0 0 0 0 0  Patient at Risk for Falls Due to    No Fall Risks No Fall Risks  Fall risk Follow up    Falls evaluation  completed Falls evaluation completed     Data saved with a previous flowsheet row definition   Functional Status Survey:    Vitals:   06/06/24 1508  BP: 128/88  Pulse: 82  Resp: 18  Temp: 98 F (36.7 C)  SpO2: 99%  Weight: 212 lb (96.2 kg)  Height: 5' 7 (1.702 m)   Body mass index is 33.2 kg/m. Physical Exam  VITALS: T- 98.0, P- 82, BP- 128/88, SaO2- 99% GENERAL: Alert, cooperative, well developed, no acute distress. HEENT: Normocephalic, normal oropharynx, moist mucous membranes, tympanic membranes normal bilaterally, nasal mucosa normal, no nasal discharge, pharynx normal, no sinus tenderness. NECK: No cervical adenopathy. CHEST: Clear to auscultation bilaterally, no wheezes, rhonchi, or crackles. CARDIOVASCULAR: Normal heart rate and rhythm, S1 and S2 normal without murmurs. ABDOMEN: Soft, non-tender, non-distended, without organomegaly, normal bowel sounds, abdomen normal to auscultation. EXTREMITIES: No cyanosis or edema. NEUROLOGICAL: Cranial nerves grossly intact, moves all extremities without gross motor or sensory deficit.   Labs reviewed: Recent Labs    06/21/23 0444 06/22/23 0458 06/23/23 0528 06/29/23 1134 09/18/23 1007 03/18/24 1421  NA 140 134* 138 138 137 139  K 3.6 3.6 3.8 4.5 4.4 4.5  CL 108 103 107 106 104 106  CO2 23 21* 23 23 24 24   GLUCOSE 107* 165* 170* 189* 142* 188*  BUN 24* 24* 23 17 18 24   CREATININE 1.14* 1.31* 1.16* 1.30* 1.20* 0.99  CALCIUM  8.9 8.3* 8.7* 9.2 9.2 9.3  MG 1.7 1.7 1.7  --   --   --   PHOS 4.6 4.6 4.6  --   --   --    Recent Labs    06/18/23 2343 09/18/23 1007 03/18/24 1421  AST 20 17 21   ALT 16 13 21   ALKPHOS 54  --   --   BILITOT 0.4 0.3 0.4  PROT 6.2* 7.5 7.5  ALBUMIN 3.0*  --   --    Recent Labs    06/29/23 1134 09/18/23 1007 03/18/24 1421  WBC 7.4 5.5 5.4  NEUTROABS 4,928 3,207 2,695  HGB 9.3* 10.1* 10.2*  HCT 28.8* 31.2* 30.6*  MCV 87.8 85.7 86.9  PLT 503* 403* 451*   Lab Results  Component  Value Date   TSH 0.91 03/18/2024   Lab Results  Component Value Date   HGBA1C 9.6 (H) 03/18/2024   Lab Results  Component Value Date   CHOL 116 03/18/2024   HDL 42 (L) 03/18/2024   LDLCALC 57 03/18/2024   LDLDIRECT 54.0 08/10/2021   TRIG 87 03/18/2024   CHOLHDL 2.8 03/18/2024  Significant Diagnostic Results in last 30 days:  No results found.  Assessment/Plan  Acute cough due to COVID-19 infection Acute cough following COVID-19 infection, improving over the past week. Initial positive COVID-19 test at urgent care. No fever, chills, or significant nasal symptoms. Cough is intermittent, primarily at night, with white phlegm production. No shortness of breath or wheezing. Blood pressure, pulse, and oxygen saturation are within normal limits. No nausea or vomiting. Appetite is normal. No medication was provided at urgent care due to potential interaction with blood thinners. - Recommended Mucinex  to help loosen cough and facilitate expectoration. - Advised to monitor for fever, shortness of breath, or wheezing and report if these occur. - Encouraged hydration and consumption of homemade chicken soup. - Advised to wear a mask in crowded places to prevent further infection.  Type 2 diabetes mellitus complicated by diabetic neuropathy Blood sugar levels are well-controlled with recent readings ranging from 97 to 140 mg/dL. No acute issues reported. She is aware of the potential impact of COVID-19 on blood sugar levels and is monitoring them closely. - Continue current diabetes management plan. - Refilled Humalog  prescription at [facility] on [address].  Hand osteoarthritis Worsening symptoms in the right hand, particularly affecting the little finger, which is swollen and unable to straighten. No prior orthopedic evaluation. Possible arthritis suspected. - Ordered x-ray of the right hand to assess for arthritis or other structural issues. - Will consider referral to orthopedic  specialist based on x-ray findings.   Family/ staff Communication: Reviewed plan of care with patient verbalized understanding  Labs/tests ordered: None   Next Appointment: Return if symptoms worsen or fail to improve.   Total time: 28 minutes. Greater than 50% of total time spent doing patient education regarding Acute cough due to COVID-19 infection ,Type 2 diabetes mellitus complicated by diabetic neuropathy,Hand osteoarthritis,health maintenance including symptom/medication management.   Zoella Roberti C Maxey Ransom, NP    [1]  Allergies Allergen Reactions   Penicillins     Said she was young had a reaction    Sulfa  Antibiotics Other (See Comments)    Pt unable to report reaction Said she had a rash   "

## 2024-09-16 ENCOUNTER — Other Ambulatory Visit: Payer: Self-pay

## 2024-09-19 ENCOUNTER — Ambulatory Visit: Payer: Self-pay | Admitting: Family
